# Patient Record
Sex: Male | Born: 1980 | Race: White | Hispanic: No | Marital: Married | Smoking: Current every day smoker
Health system: Southern US, Community
[De-identification: ages and names within clinical notes are randomized; demographics above are authoritative.]

## PROBLEM LIST (undated history)

## (undated) DIAGNOSIS — N319 Neuromuscular dysfunction of bladder, unspecified: Secondary | ICD-10-CM

## (undated) DIAGNOSIS — F447 Conversion disorder with mixed symptom presentation: Secondary | ICD-10-CM

## (undated) DIAGNOSIS — I4891 Unspecified atrial fibrillation: Secondary | ICD-10-CM

## (undated) DIAGNOSIS — N329 Bladder disorder, unspecified: Secondary | ICD-10-CM

## (undated) HISTORY — PX: BACK SURGERY: SHX140

---

## 2003-05-24 ENCOUNTER — Other Ambulatory Visit: Payer: Self-pay

## 2004-02-25 ENCOUNTER — Emergency Department: Payer: Self-pay | Admitting: Emergency Medicine

## 2004-04-04 ENCOUNTER — Emergency Department: Payer: Self-pay | Admitting: Emergency Medicine

## 2004-06-09 ENCOUNTER — Emergency Department: Payer: Self-pay | Admitting: Emergency Medicine

## 2004-07-18 ENCOUNTER — Emergency Department: Payer: Self-pay | Admitting: Unknown Physician Specialty

## 2004-07-18 ENCOUNTER — Other Ambulatory Visit: Payer: Self-pay

## 2004-07-28 ENCOUNTER — Emergency Department: Payer: Self-pay | Admitting: Emergency Medicine

## 2004-07-29 ENCOUNTER — Emergency Department: Payer: Self-pay | Admitting: Emergency Medicine

## 2004-09-08 ENCOUNTER — Emergency Department: Payer: Self-pay | Admitting: Emergency Medicine

## 2004-09-11 ENCOUNTER — Other Ambulatory Visit: Payer: Self-pay

## 2004-09-11 ENCOUNTER — Emergency Department: Payer: Self-pay | Admitting: Emergency Medicine

## 2004-11-08 ENCOUNTER — Emergency Department: Payer: Self-pay | Admitting: Emergency Medicine

## 2004-11-13 ENCOUNTER — Emergency Department: Payer: Self-pay | Admitting: Internal Medicine

## 2005-01-10 ENCOUNTER — Emergency Department: Payer: Self-pay | Admitting: Emergency Medicine

## 2005-01-20 ENCOUNTER — Emergency Department: Payer: Self-pay | Admitting: Emergency Medicine

## 2005-01-25 ENCOUNTER — Emergency Department: Payer: Self-pay | Admitting: Emergency Medicine

## 2007-03-18 DIAGNOSIS — I639 Cerebral infarction, unspecified: Secondary | ICD-10-CM

## 2007-03-18 HISTORY — DX: Cerebral infarction, unspecified: I63.9

## 2017-05-22 ENCOUNTER — Other Ambulatory Visit: Payer: Self-pay

## 2017-05-22 ENCOUNTER — Emergency Department (HOSPITAL_COMMUNITY)
Admission: EM | Admit: 2017-05-22 | Discharge: 2017-05-22 | Disposition: A | Discharge status: Home / Self Care | Payer: Self-pay | Attending: Emergency Medicine | Admitting: Emergency Medicine

## 2017-05-22 ENCOUNTER — Encounter (HOSPITAL_COMMUNITY): Payer: Self-pay

## 2017-05-22 DIAGNOSIS — Z5321 Procedure and treatment not carried out due to patient leaving prior to being seen by health care provider: Secondary | ICD-10-CM | POA: Insufficient documentation

## 2017-05-22 DIAGNOSIS — R339 Retention of urine, unspecified: Secondary | ICD-10-CM | POA: Insufficient documentation

## 2017-05-22 LAB — BASIC METABOLIC PANEL
ANION GAP: 12 (ref 5–15)
BUN: 9 mg/dL (ref 6–20)
CALCIUM: 8.9 mg/dL (ref 8.9–10.3)
CHLORIDE: 104 mmol/L (ref 101–111)
CO2: 22 mmol/L (ref 22–32)
CREATININE: 0.98 mg/dL (ref 0.61–1.24)
GFR calc non Af Amer: >60 mL/min (ref >60)
Glucose, Bld: 116 mg/dL — ABNORMAL HIGH (ref 65–99)
Potassium: 3.8 mmol/L (ref 3.5–5.1)
SODIUM: 138 mmol/L (ref 135–145)

## 2017-05-22 LAB — CBC
HCT: 42.6 % (ref 39.0–52.0)
HEMOGLOBIN: 14.6 g/dL (ref 13.0–17.0)
MCH: 32.5 pg (ref 26.0–34.0)
MCHC: 34.3 g/dL (ref 30.0–36.0)
MCV: 94.9 fL (ref 78.0–100.0)
PLATELETS: 254 10*3/uL (ref 150–400)
RBC: 4.49 MIL/uL (ref 4.22–5.81)
RDW: 13.3 % (ref 11.5–15.5)
WBC: 17.2 10*3/uL — AB (ref 4.0–10.5)
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
## 2017-05-22 NOTE — ED Notes (Signed)
Due to wait time pt has decided to leave

## 2019-06-10 ENCOUNTER — Other Ambulatory Visit: Payer: Self-pay

## 2019-06-10 ENCOUNTER — Ambulatory Visit: Payer: Self-pay | Attending: Internal Medicine

## 2019-06-10 DIAGNOSIS — Z20822 Contact with and (suspected) exposure to covid-19: Secondary | ICD-10-CM

## 2019-06-11 LAB — SARS-COV-2, NAA 2 DAY TAT

## 2019-06-11 LAB — NOVEL CORONAVIRUS, NAA: SARS-CoV-2, NAA: NOT DETECTED

## 2019-06-13 ENCOUNTER — Telehealth: Payer: Self-pay | Admitting: General Practice

## 2019-06-13 NOTE — Telephone Encounter (Signed)
Patient called in and received his negative covid test result  °

## 2019-10-14 ENCOUNTER — Emergency Department (HOSPITAL_COMMUNITY): Payer: Self-pay

## 2019-10-14 ENCOUNTER — Other Ambulatory Visit: Payer: Self-pay

## 2019-10-14 ENCOUNTER — Emergency Department (HOSPITAL_COMMUNITY)
Admission: EM | Admit: 2019-10-14 | Discharge: 2019-10-15 | Payer: Self-pay | Attending: Emergency Medicine | Admitting: Emergency Medicine

## 2019-10-14 ENCOUNTER — Encounter (HOSPITAL_COMMUNITY): Payer: Self-pay

## 2019-10-14 DIAGNOSIS — Y939 Activity, unspecified: Secondary | ICD-10-CM | POA: Insufficient documentation

## 2019-10-14 DIAGNOSIS — Y92193 Bedroom in other specified residential institution as the place of occurrence of the external cause: Secondary | ICD-10-CM | POA: Insufficient documentation

## 2019-10-14 DIAGNOSIS — R531 Weakness: Secondary | ICD-10-CM

## 2019-10-14 DIAGNOSIS — F17200 Nicotine dependence, unspecified, uncomplicated: Secondary | ICD-10-CM | POA: Insufficient documentation

## 2019-10-14 DIAGNOSIS — W06XXXA Fall from bed, initial encounter: Secondary | ICD-10-CM | POA: Insufficient documentation

## 2019-10-14 DIAGNOSIS — Z8673 Personal history of transient ischemic attack (TIA), and cerebral infarction without residual deficits: Secondary | ICD-10-CM | POA: Insufficient documentation

## 2019-10-14 DIAGNOSIS — Y999 Unspecified external cause status: Secondary | ICD-10-CM | POA: Insufficient documentation

## 2019-10-14 DIAGNOSIS — S20211A Contusion of right front wall of thorax, initial encounter: Secondary | ICD-10-CM

## 2019-10-14 LAB — URINALYSIS, ROUTINE W REFLEX MICROSCOPIC
Bilirubin Urine: NEGATIVE
Glucose, UA: NEGATIVE mg/dL
Hgb urine dipstick: NEGATIVE
Ketones, ur: NEGATIVE mg/dL
Leukocytes,Ua: NEGATIVE
Nitrite: NEGATIVE
Protein, ur: NEGATIVE mg/dL
Specific Gravity, Urine: 1.004 — ABNORMAL LOW (ref 1.005–1.030)
pH: 8 (ref 5.0–8.0)

## 2019-10-14 LAB — COMPREHENSIVE METABOLIC PANEL
ALT: 24 U/L (ref 0–44)
AST: 18 U/L (ref 15–41)
Albumin: 3.9 g/dL (ref 3.5–5.0)
Alkaline Phosphatase: 60 U/L (ref 38–126)
Anion gap: 8 (ref 5–15)
BUN: 9 mg/dL (ref 6–20)
CO2: 30 mmol/L (ref 22–32)
Calcium: 9.4 mg/dL (ref 8.9–10.3)
Chloride: 100 mmol/L (ref 98–111)
Creatinine, Ser: 0.8 mg/dL (ref 0.61–1.24)
GFR calc Af Amer: >60 mL/min (ref >60)
GFR calc non Af Amer: >60 mL/min (ref >60)
Glucose, Bld: 92 mg/dL (ref 70–99)
Potassium: 3.8 mmol/L (ref 3.5–5.1)
Sodium: 138 mmol/L (ref 135–145)
Total Bilirubin: 0.8 mg/dL (ref 0.3–1.2)
Total Protein: 6.3 g/dL — ABNORMAL LOW (ref 6.5–8.1)

## 2019-10-14 LAB — DIFFERENTIAL
Abs Immature Granulocytes: 0.05 10*3/uL (ref 0.00–0.07)
Basophils Absolute: 0.1 10*3/uL (ref 0.0–0.1)
Basophils Relative: 1 %
Eosinophils Absolute: 0.1 10*3/uL (ref 0.0–0.5)
Eosinophils Relative: 1 %
Immature Granulocytes: 1 %
Lymphocytes Relative: 36 %
Lymphs Abs: 3.2 10*3/uL (ref 0.7–4.0)
Monocytes Absolute: 0.6 10*3/uL (ref 0.1–1.0)
Monocytes Relative: 7 %
Neutro Abs: 4.9 10*3/uL (ref 1.7–7.7)
Neutrophils Relative %: 54 %

## 2019-10-14 LAB — I-STAT CHEM 8, ED
BUN: 12 mg/dL (ref 6–20)
Calcium, Ion: 0.99 mmol/L — ABNORMAL LOW (ref 1.15–1.40)
Chloride: 102 mmol/L (ref 98–111)
Creatinine, Ser: 0.7 mg/dL (ref 0.61–1.24)
Glucose, Bld: 86 mg/dL (ref 70–99)
HCT: 40 % (ref 39.0–52.0)
Hemoglobin: 13.6 g/dL (ref 13.0–17.0)
Potassium: 3.7 mmol/L (ref 3.5–5.1)
Sodium: 139 mmol/L (ref 135–145)
TCO2: 25 mmol/L (ref 22–32)

## 2019-10-14 LAB — PROTIME-INR
INR: 1 (ref 0.8–1.2)
Prothrombin Time: 12.5 seconds (ref 11.4–15.2)

## 2019-10-14 LAB — CBG MONITORING, ED: Glucose-Capillary: 97 mg/dL (ref 70–99)

## 2019-10-14 LAB — CBC
HCT: 42.1 % (ref 39.0–52.0)
Hemoglobin: 13.7 g/dL (ref 13.0–17.0)
MCH: 31.8 pg (ref 26.0–34.0)
MCHC: 32.5 g/dL (ref 30.0–36.0)
MCV: 97.7 fL (ref 80.0–100.0)
Platelets: 228 10*3/uL (ref 150–400)
RBC: 4.31 MIL/uL (ref 4.22–5.81)
RDW: 11.9 % (ref 11.5–15.5)
WBC: 8.9 10*3/uL (ref 4.0–10.5)
nRBC: 0 % (ref 0.0–0.2)

## 2019-10-14 LAB — RAPID URINE DRUG SCREEN, HOSP PERFORMED
Amphetamines: NOT DETECTED
Barbiturates: NOT DETECTED
Benzodiazepines: NOT DETECTED
Cocaine: NOT DETECTED
Opiates: NOT DETECTED
Tetrahydrocannabinol: NOT DETECTED

## 2019-10-14 LAB — APTT: aPTT: 27 seconds (ref 24–36)

## 2019-10-14 LAB — ETHANOL: Alcohol, Ethyl (B): <10 mg/dL (ref <10)

## 2019-10-14 MED ORDER — LORAZEPAM 2 MG/ML IJ SOLN
INTRAMUSCULAR | Status: AC
  Filled 2019-10-14: qty 1

## 2019-10-14 MED ORDER — ACETAMINOPHEN 500 MG PO TABS
1000.0000 mg | ORAL_TABLET | Freq: Once | ORAL | Status: AC
  Administered 2019-10-14: 1000 mg via ORAL
  Filled 2019-10-14: qty 2

## 2019-10-14 NOTE — ED Provider Notes (Signed)
[facility] EMERGENCY DEPARTMENT Provider Note   CSN: [id number] Arrival date & time: 10/14/19  2117  An emergency department physician performed an initial assessment on this suspected stroke patient at 2117.  History Chief Complaint  Patient presents with  . Code Stroke    Matthew Armstrong. is a 39 y.o. male.  39 year old male who presents with left-sided weakness.  Patient reportedly had sudden onset of flaccid left-sided weakness at 8 PM tonight. He reportedly fell due to weakness and hit R chest. He tells [place] that he has history of 3 previous strokes.  He previously could not get a brain MRI because he had a nerve stimulator implant but no longer has this. He endorses decreased sensation L side involving upper and lower extremities.  The history is provided by the EMS personnel.       History reviewed. No pertinent past medical history.  There are no problems to display for this patient.   History reviewed. No pertinent surgical history.     No family history on file.  Social History   Tobacco Use  . Smoking status: Current Every Day Smoker  . Smokeless tobacco: Never Used  Substance Use Topics  . Alcohol use: Yes  . Drug use: No    Home Medications Prior to Admission medications   Not on File    Allergies    Ciprofloxacin and Nsaids  Review of Systems   Review of Systems  Unable to perform ROS: Mental status change    Physical Exam Updated Vital Signs BP (!) 139/87   Pulse 57   Temp 98.4 F (36.9 C) (Oral)   Resp 15   Ht 5\' 11"  (1.803 m)   SpO2 99%   BMI 29.01 kg/m   Physical Exam Vitals and nursing note reviewed.  Constitutional:      General: He is not in acute distress.    Appearance: He is well-developed.  HENT:     Head: Normocephalic and atraumatic.  Eyes:     Extraocular Movements: Extraocular movements intact.     Conjunctiva/sclera: Conjunctivae normal.     Pupils: Pupils are equal, round, and reactive to  light.  Cardiovascular:     Rate and Rhythm: Normal rate and regular rhythm.     Heart sounds: Normal heart sounds. No murmur heard.   Pulmonary:     Effort: Pulmonary effort is normal.     Breath sounds: Normal breath sounds.  Chest:     Chest wall: Tenderness (R lower anterior ribs, no crepitus) present.  Abdominal:     General: Bowel sounds are normal. There is no distension.     Palpations: Abdomen is soft.     Tenderness: There is no abdominal tenderness.  Musculoskeletal:     Cervical back: Neck supple.  Skin:    General: Skin is warm and dry.  Neurological:     Mental Status: He is alert and oriented to person, place, and time.     Comments: Fluent speech, head turned to R; protrudes tongue to R, holds mouth pulled to R; endorses decreased sensation L forehead, LUE, and LLE; no effort when asked to move LUE/LLE; 5/5 strength RUE, RLE  Psychiatric:     Comments: Flat affect     ED Results / Procedures / Treatments   Labs (all labs ordered are listed, but only abnormal results are displayed) Labs Reviewed  URINALYSIS, ROUTINE W REFLEX MICROSCOPIC - Abnormal; Notable for the following components:  Result Value   Color, Urine STRAW (*)    Specific Gravity, Urine 1.004 (*)    All other components within normal limits  COMPREHENSIVE METABOLIC PANEL - Abnormal; Notable for the following components:   Total Protein 6.3 (*)    All other components within normal limits  I-STAT CHEM 8, ED - Abnormal; Notable for the following components:   Calcium, Ion 0.99 (*)    All other components within normal limits  ETHANOL  PROTIME-INR  APTT  CBC  DIFFERENTIAL  RAPID URINE DRUG SCREEN, HOSP PERFORMED  CBG MONITORING, ED    EKG EKG Interpretation  Date/Time:  Friday October 14 2019 22:13:17 EDT Ventricular Rate:  59 PR Interval:    QRS Duration: 98 QT Interval:  399 QTC Calculation: 396 R Axis:   72 Text Interpretation: Sinus rhythm since previous tracing, rate slower  Confirmed by Frederick PeersLittle, Ireoluwa Grant [phone]) on 10/14/2019 10:16:32 PM   Radiology DG Chest 2 View  Result Date: 10/14/2019 CLINICAL DATA:  Right lower anterior rib pain after fall. Fall across a metal chair today. EXAM: CHEST - 2 VIEW COMPARISON:  None. FINDINGS: The cardiomediastinal contours are normal. The lungs are clear. Pulmonary vasculature is normal. No consolidation, pleural effusion, or pneumothorax. No visualized rib fracture. No acute osseous abnormalities are seen. IMPRESSION: Negative radiographs of the chest.  No visualized rib fractures. Electronically Signed   By: Narda RutherfordMelanie  Sanford M.D.   On: 10/14/2019 23:07   MR ANGIO HEAD WO CONTRAST  Result Date: 10/14/2019 CLINICAL DATA:  39 year old male code stroke presentation with possible hyperdense right MCA and early cytotoxic edema in the right frontal operculum on noncontrast head CT, but discordant clinical exam. EXAM: MRI HEAD WITHOUT CONTRAST MRA HEAD WITHOUT CONTRAST TECHNIQUE: Multiplanar, multiecho pulse sequences of the brain and surrounding structures were obtained without intravenous contrast. Angiographic images of the head were obtained using MRA technique without contrast. COMPARISON:  Head CT 2129 hours today. FINDINGS: MRI HEAD FINDINGS Brain: No restricted diffusion or evidence of acute infarction. Normal cerebral volume. No midline shift, mass effect, evidence of mass lesion, ventriculomegaly, extra-axial collection or acute intracranial hemorrhage. Cervicomedullary junction and pituitary are within normal limits. Wallace CullensGray and white matter signal is within normal limits throughout the brain. No encephalomalacia or chronic cerebral blood products. Vascular: Major intracranial vascular flow voids are preserved. Skull and upper cervical spine: Negative visible cervical spine, bone marrow signal. Sinuses/Orbits: Negative orbits aside from continued leftward gaze. Left maxillary sinus mucous retention cyst. Other: Mastoids are clear. Visible  internal auditory structures appear normal. Scalp and face soft tissues appear negative. MRA HEAD FINDINGS Antegrade flow in the posterior circulation with codominant distal vertebral arteries, normal PICA origins. Patent vertebrobasilar junction and basilar artery without stenosis. Normal SCA and left PCA origins. Fetal right PCA origin. Possible small left posterior communicating artery. Bilateral PCA branches are within normal limits. Antegrade flow in both ICA siphons. No siphon stenosis. Normal ophthalmic and right posterior communicating artery origins. Patent carotid termini, MCA and ACA origins. Anterior communicating artery and visible ACA branches are within normal limits. Left MCA bifurcates early without stenosis. Visible left MCA branches are within normal limits. Right MCA also bifurcates somewhat early without stenosis. Visible right MCA branches appear normal. IMPRESSION: 1. Negative for acute infarct. Normal noncontrast MRI appearance of the brain. 2. Normal intracranial MRA. The above was discussed by telephone with Dr. Milon DikesASHISH ARORA on 10/14/2019 at 1000 hours. Electronically Signed   By: Odessa FlemingH  Hall M.D.   On: 10/14/2019 22:33  MR BRAIN WO CONTRAST  Result Date: 10/14/2019 CLINICAL DATA:  39 year old male code stroke presentation with possible hyperdense right MCA and early cytotoxic edema in the right frontal operculum on noncontrast head CT, but discordant clinical exam. EXAM: MRI HEAD WITHOUT CONTRAST MRA HEAD WITHOUT CONTRAST TECHNIQUE: Multiplanar, multiecho pulse sequences of the brain and surrounding structures were obtained without intravenous contrast. Angiographic images of the head were obtained using MRA technique without contrast. COMPARISON:  Head CT 2129 hours today. FINDINGS: MRI HEAD FINDINGS Brain: No restricted diffusion or evidence of acute infarction. Normal cerebral volume. No midline shift, mass effect, evidence of mass lesion, ventriculomegaly, extra-axial collection or  acute intracranial hemorrhage. Cervicomedullary junction and pituitary are within normal limits. Wallace Cullens and white matter signal is within normal limits throughout the brain. No encephalomalacia or chronic cerebral blood products. Vascular: Major intracranial vascular flow voids are preserved. Skull and upper cervical spine: Negative visible cervical spine, bone marrow signal. Sinuses/Orbits: Negative orbits aside from continued leftward gaze. Left maxillary sinus mucous retention cyst. Other: Mastoids are clear. Visible internal auditory structures appear normal. Scalp and face soft tissues appear negative. MRA HEAD FINDINGS Antegrade flow in the posterior circulation with codominant distal vertebral arteries, normal PICA origins. Patent vertebrobasilar junction and basilar artery without stenosis. Normal SCA and left PCA origins. Fetal right PCA origin. Possible small left posterior communicating artery. Bilateral PCA branches are within normal limits. Antegrade flow in both ICA siphons. No siphon stenosis. Normal ophthalmic and right posterior communicating artery origins. Patent carotid termini, MCA and ACA origins. Anterior communicating artery and visible ACA branches are within normal limits. Left MCA bifurcates early without stenosis. Visible left MCA branches are within normal limits. Right MCA also bifurcates somewhat early without stenosis. Visible right MCA branches appear normal. IMPRESSION: 1. Negative for acute infarct. Normal noncontrast MRI appearance of the brain. 2. Normal intracranial MRA. The above was discussed by telephone with Dr. Milon Dikes on 10/14/2019 at 1000 hours. Electronically Signed   By: Odessa Fleming M.D.   On: 10/14/2019 22:33   CT HEAD CODE STROKE WO CONTRAST  Result Date: 10/14/2019 CLINICAL DATA:  Code stroke. 39 year old male with left side weakness and facial droop. EXAM: CT HEAD WITHOUT CONTRAST TECHNIQUE: Contiguous axial images were obtained from the base of the skull through  the vertex without intravenous contrast. COMPARISON:  Head CT 01/25/2005.  Sinuses FINDINGS: Brain: Normal cerebral volume. No midline shift, ventriculomegaly, mass effect, evidence of mass lesion, intracranial hemorrhage. Subtle asymmetric hypodensity in the right operculum best seen on coronal image 33. Elsewhere normal gray-white matter differentiation, no other evidence of cytotoxic edema. Vascular: Questionable hyperdense right MCA on sagittal image 19, not well correlated on axial images. Skull: Negative. Sinuses/Orbits: left maxillary mucous retention cyst, otherwise clear. Other: Leftward gaze. Otherwise negative orbit and scalp soft tissues. ASPECTS [facility] Stroke Program Early CT Score) - Ganglionic level infarction (caudate, lentiform nuclei, internal capsule, insula, M1-M3 cortex): 7 - Supraganglionic infarction (M4-M6 cortex): 2 (abnormal right M5) Total score (0-10 with 10 being normal): 9. IMPRESSION: 1. Possible hyperdense right MCA and early cytotoxic edema in the right frontal operculum. ASPECTS 9. Negative for intracranial hemorrhage. 2. These results were communicated to Dr. Wilford Corner at 9:32 pm on 10/14/2019 by text page via the [facility] messaging system. Electronically Signed   By: Odessa Fleming M.D.   On: 10/14/2019 21:33    Procedures Procedures (including critical care time)  Medications Ordered in ED Medications  LORazepam (ATIVAN) 2 MG/ML injection (  Not  Given 10/14/19 2215)  acetaminophen (TYLENOL) tablet 1,000 mg (has no administration in time range)    ED Course  I have reviewed the triage vital signs and the nursing notes.  Pertinent labs & imaging results that were available during my care of the patient were reviewed by me and considered in my medical decision making (see chart for details).    MDM Rules/Calculators/A&P                          Pt arrived as code stroke, taken immediately to CT scan and evaluated by Dr. Wilford Corner, stroke team.  Exam had some consistencies that  were less suggestive of acute stroke.  Head CT showed possible area of hyperdensity in right MCA but negative for hemorrhage.  Obtained MRI brain which was normal. Per Dr. Wilford Corner, symptoms seemed to be non-neurogenic. CXR negative for injury and screening labs reassuring.  On reassessment, patient is sitting up in bed, moving arms and legs freely, no acute distress.  Complaining of headache for which I gave him Tylenol. Discussed w/u results.  Patient discharged back to detention facility. Final Clinical Impression(s) / ED Diagnoses Final diagnoses:  Left-sided weakness  Contusion of right chest wall, initial encounter    Rx / DC Orders ED Discharge Orders    None       Trevan Messman, Ambrose Finland, MD 10/14/19 [id number]

## 2019-10-14 NOTE — ED Notes (Signed)
Patient verbalizes understanding of discharge instructions. Opportunity for questioning and answers were provided. Armband removed by staff, Pt is now awaiting security to be escorted off property.

## 2019-10-14 NOTE — ED Triage Notes (Signed)
Pt BIB [facility] from [facility] as a Code Stroke.   LSN was 2000 during CO rounds and shortly later pt fell due to weakness and called for help. Pt complaining of pain on R. Rib Cage from Fall.  Upon arrival, pt presents with L. Sided Weakness, Dysarthria, and L. Arm/Leg Drift.   VSS with EMS  CBG 114 and BP 117/80 with EMS.  A&Ox4, FQH22

## 2019-10-14 NOTE — Code Documentation (Signed)
  Rapid Response Nurse Documentation Code Documentation  Matthew Armstrong. is a 39 y.o. male arriving to [facility] ED via [facility] on 7/30 @ 2117 with past medical hx of headaches, CVA x 3(per pt), neurogenic bladder s/p bladder stimulator implant(now removed). Code stroke was activated by EMS. Patient from a detention facility where he was LKW at 2000 and now complaining of L side weakness, L facial droop, and slurred speech. Stroke team at the bedside on patient arrival. Labs drawn and patient cleared for CT by Dr. Clarene Duke. Patient to CT with team. NIHSS 12, see documentation for details and code stroke times. Patient with L side flaccidity, L droop, and slurred speech on exam. The following imagings were completed:  CT-possible hyperdense R MCA and early cytotoxic edema in R frontal operculum, no hemorrhage. Pt was then transported to MRI @ 2129 which was negative.  Patient is not a candidate for tPA due to not a stroke. Plan to treat for migraines.   Matthew Armstrong, Matthew Armstrong  [employer]

## 2019-10-14 NOTE — Consult Note (Signed)
Neurology Consultation  Reason for Consult: code stroke for left sided weakness Referring Physician: Dr Everett Graff, EDP  CC: Left sided weakness  History is obtained frompatient and chart  HPI: Matthew Armstrong. is a 39 y.o. male currently in a detention facility, with PMH of headaches and also according to patient, 3 strokes in the past with no residual deficits, neurogenic bladder s/p bladder stimulator implant followed by removal of implant and lead in 2019, presented from facility via [facility] due to sudden onset of left sided weakness.  He was in his usual state of health when he suddenly noticed left sided weakness which made him fall from his bunk. He was not able to move the left arm or leg. Also had slurred speech. EMS called and evaluated the patient - found left sided weakness, facial droop and dysarthria and activated code stroke and transported to [facility] ED. In the ER, we received him on the bridge, examined and took for stat CTH. Exam consistent with some non organic findings making stroke diagnosis dicey. Taken for stat MRI brain due to CT reading from radiology with possible right MCA hyperdensity. He had no lines to do a stat CTA and in the interest of time, MRI was chosen as a table was open immediately. See exam and imaging studies below.   LKW: 2000 hrs 10/14/2019 tpa given?: no, psychogenic exam, MRI negative Premorbid modified Rankin scale (mRS): 0   ROS:  Performed and negative except as noted in HPI  PMH reviewed and documented in HPI by  Me  No family history on file.  Social History:   reports that he has been smoking. He has never used smokeless tobacco. He reports current alcohol use. He reports that he does not use drugs.  Medications  Current Facility-Administered Medications:  .  LORazepam (ATIVAN) 2 MG/ML injection, , , ,  No current outpatient medications on file.   Exam: Current vital signs: BP 109/75 (BP Location: Right Arm)   Pulse 59    Temp 98.3 F (36.8 C) (Oral)   Resp 16   Ht 5\' 11"  (1.803 m)   SpO2 100%   BMI 29.01 kg/m  Vital signs in last 24 hours: Temp:  [98.3 F (36.8 C)-98.4 F (36.9 C)] 98.3 F (36.8 C) (07/30 2343) Pulse Rate:  [57-61] 59 (07/30 2343) Resp:  [15-19] 16 (07/30 2343) BP: (109-139)/(75-87) 109/75 (07/30 2343) SpO2:  [99 %-100 %] 100 % (07/30 2343) Gen: AAO x3 in NAD HEENT: [place] AT MMM CVS: RRR Resp: CTABL Ext: Warm well perfused NEUROLOGICAL MS: AAOx3 Speech and language: No aphasia, Mild dysarthria CN: PERRL. No gaze pref or deviation, VFF, facial sensation diminished with sharp cutoff in midline on the forehead, facial strength exam reveals volitional suppression of opening of left angle of the mouth. Tongue exam; upon asking to protrude tongue, tongue deviates to right Motor: flaccid LUE and LLE. 5/5 RUE and RLE Sensory: not sensate to touch or nox stim on left. Sharp cutoff in midline with intact sensation on right. Coord: no dysmetria on right, can not perform left side Gait deferred  NIHSS 1a Level of Conscious.: 0 1b LOC Questions: 0 1c LOC Commands: 0 2 Best Gaze: 0 3 Visual: 0 4 Facial Palsy: 1 5a Motor Arm - left: 4 5b Motor Arm - Right: 0 6a Motor Leg - Left: 4 6b Motor Leg - Right: 0 7 Limb Ataxia: 0 8 Sensory: 2 9 Best Language: 0 10 Dysarthria: 1 11 Extinct. and  Inatten.: 0 TOTAL: 12  Labs I have reviewed labs in epic and the results pertinent to this consultation are: CBC    Component Value Date/Time   WBC 8.9 10/14/2019 2118   RBC 4.31 10/14/2019 2118   HGB 13.6 10/14/2019 2127   HCT 40.0 10/14/2019 2127   PLT 228 10/14/2019 2118   MCV 97.7 10/14/2019 2118   MCH 31.8 10/14/2019 2118   MCHC 32.5 10/14/2019 2118   RDW 11.9 10/14/2019 2118   LYMPHSABS 3.2 10/14/2019 2118   MONOABS 0.6 10/14/2019 2118   EOSABS 0.1 10/14/2019 2118   BASOSABS 0.1 10/14/2019 2118    CMP     Component Value Date/Time   NA 138 10/14/2019 2215   K 3.8 10/14/2019  2215   CL 100 10/14/2019 2215   CO2 30 10/14/2019 2215   GLUCOSE 92 10/14/2019 2215   BUN 9 10/14/2019 2215   CREATININE 0.80 10/14/2019 2215   CALCIUM 9.4 10/14/2019 2215   PROT 6.3 (L) 10/14/2019 2215   ALBUMIN 3.9 10/14/2019 2215   AST 18 10/14/2019 2215   ALT 24 10/14/2019 2215   ALKPHOS 60 10/14/2019 2215   BILITOT 0.8 10/14/2019 2215   GFRNONAA >60 10/14/2019 2215   GFRAA >60 10/14/2019 2215   Imaging I have reviewed the images obtained:  CT-scan of the brain-question hyperdense right MCA although could be artifactual. My ASPECTS is 10. Radiology ASPECTS 9 for possible loss of GW differentiation in right insula.  MRI examination of the brain-obtained stat-negative for stroke. MRA head to evaluate the questionable hyperdense MCA reveals no occlusion or stenosis in the right MCA.  CT finding likely artifactual.  Assessment:  39 year old man brought in from a detention facility for acute onset of left-sided flaccid weakness. Exam with multiple inconsistencies including volitional facial weakness, sharp cutoff of sensation in the midline and effort related weakness in the left side. Also tongue deviation to the right-inconsistent with a left hemiparetic stroke. Suspicion for stroke is low but he provided verbal history of having had multiple strokes in the past, because of which a stat MRI was performed-which was negative for stroke. Also reports history of headaches. At this time top of the differential psychogenic weakness versus complex migraine.  Recommendations: Migraine cocktail If symptoms improve, no further neurological work-up. Outpatient neurology follow-up for headaches if desired. Plan discussed with Dr. Clarene Duke in the emergency room. Please call neurology with questions.    -- Milon Dikes, MD [facility] Pager: [phone] If 7pm to 7am, please call on call as listed on AMION.

## 2020-08-04 ENCOUNTER — Emergency Department
Admission: EM | Admit: 2020-08-04 | Discharge: 2020-08-04 | Disposition: A | Discharge status: Other Acute Inpatient Hospital | Payer: Self-pay | Attending: Emergency Medicine | Admitting: Emergency Medicine

## 2020-08-04 ENCOUNTER — Other Ambulatory Visit: Payer: Self-pay

## 2020-08-04 ENCOUNTER — Emergency Department: Payer: Self-pay

## 2020-08-04 ENCOUNTER — Encounter: Payer: Self-pay | Admitting: Intensive Care

## 2020-08-04 DIAGNOSIS — W3400XA Accidental discharge from unspecified firearms or gun, initial encounter: Secondary | ICD-10-CM | POA: Insufficient documentation

## 2020-08-04 DIAGNOSIS — S62601B Fracture of unspecified phalanx of left index finger, initial encounter for open fracture: Secondary | ICD-10-CM | POA: Insufficient documentation

## 2020-08-04 DIAGNOSIS — Z20822 Contact with and (suspected) exposure to covid-19: Secondary | ICD-10-CM | POA: Insufficient documentation

## 2020-08-04 DIAGNOSIS — F17210 Nicotine dependence, cigarettes, uncomplicated: Secondary | ICD-10-CM | POA: Insufficient documentation

## 2020-08-04 LAB — SAMPLE TO BLOOD BANK

## 2020-08-04 LAB — RESP PANEL BY RT-PCR (FLU A&B, COVID) ARPGX2
Influenza A by PCR: NEGATIVE
Influenza B by PCR: NEGATIVE
SARS Coronavirus 2 by RT PCR: NEGATIVE

## 2020-08-04 LAB — COMPREHENSIVE METABOLIC PANEL
ALT: 18 U/L (ref 0–44)
AST: 25 U/L (ref 15–41)
Albumin: 4.6 g/dL (ref 3.5–5.0)
Alkaline Phosphatase: 92 U/L (ref 38–126)
Anion gap: 16 — ABNORMAL HIGH (ref 5–15)
BUN: 17 mg/dL (ref 6–20)
CO2: 18 mmol/L — ABNORMAL LOW (ref 22–32)
Calcium: 9.5 mg/dL (ref 8.9–10.3)
Chloride: 103 mmol/L (ref 98–111)
Creatinine, Ser: 1.04 mg/dL (ref 0.61–1.24)
GFR, Estimated: >60 mL/min (ref >60)
Glucose, Bld: 143 mg/dL — ABNORMAL HIGH (ref 70–99)
Potassium: 3.3 mmol/L — ABNORMAL LOW (ref 3.5–5.1)
Sodium: 137 mmol/L (ref 135–145)
Total Bilirubin: 1.1 mg/dL (ref 0.3–1.2)
Total Protein: 8.1 g/dL (ref 6.5–8.1)

## 2020-08-04 LAB — CBC WITH DIFFERENTIAL/PLATELET
Abs Immature Granulocytes: 0.04 10*3/uL (ref 0.00–0.07)
Basophils Absolute: 0 10*3/uL (ref 0.0–0.1)
Basophils Relative: 0 %
Eosinophils Absolute: 0.1 10*3/uL (ref 0.0–0.5)
Eosinophils Relative: 1 %
HCT: 43.3 % (ref 39.0–52.0)
Hemoglobin: 15.1 g/dL (ref 13.0–17.0)
Immature Granulocytes: 0 %
Lymphocytes Relative: 25 %
Lymphs Abs: 3.1 10*3/uL (ref 0.7–4.0)
MCH: 31.5 pg (ref 26.0–34.0)
MCHC: 34.9 g/dL (ref 30.0–36.0)
MCV: 90.2 fL (ref 80.0–100.0)
Monocytes Absolute: 0.6 10*3/uL (ref 0.1–1.0)
Monocytes Relative: 5 %
Neutro Abs: 8.7 10*3/uL — ABNORMAL HIGH (ref 1.7–7.7)
Neutrophils Relative %: 69 %
Platelets: 290 10*3/uL (ref 150–400)
RBC: 4.8 MIL/uL (ref 4.22–5.81)
RDW: 13.1 % (ref 11.5–15.5)
WBC: 12.5 10*3/uL — ABNORMAL HIGH (ref 4.0–10.5)
nRBC: 0 % (ref 0.0–0.2)

## 2020-08-04 MED ORDER — HYDROMORPHONE HCL 1 MG/ML IJ SOLN
1.0000 mg | Freq: Once | INTRAMUSCULAR | Status: AC
  Administered 2020-08-04: 1 mg via INTRAVENOUS
  Filled 2020-08-04: qty 1

## 2020-08-04 MED ORDER — SODIUM CHLORIDE 0.9 % IV SOLN
1.5000 g | Freq: Once | INTRAVENOUS | Status: AC
  Administered 2020-08-04: 1.5 g via INTRAVENOUS
  Filled 2020-08-04: qty 4

## 2020-08-04 MED ORDER — ONDANSETRON HCL 4 MG/2ML IJ SOLN
4.0000 mg | Freq: Once | INTRAMUSCULAR | Status: AC
  Administered 2020-08-04: 4 mg via INTRAVENOUS
  Filled 2020-08-04: qty 2

## 2020-08-04 MED ORDER — DEXTROSE 5 % IV SOLN: 0.5000 g | Freq: Once | INTRAVENOUS | Status: DC
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
## 2020-08-04 NOTE — ED Notes (Addendum)
Xeroform applied to wound per Fuller Plan, MD. Volar splint applied by Verdon Cummins, NT. Cap refill less than 3 seconds in fingers.
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
## 2020-08-04 NOTE — ED Notes (Signed)
Grey colored towel placed in brown bag with other previously chart patient belongings.

## 2020-08-04 NOTE — ED Notes (Signed)
X-ray at bedside

## 2020-08-04 NOTE — ED Notes (Signed)
Patient given patient phone to make calls.
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
## 2020-08-04 NOTE — ED Notes (Signed)
Transfer form signed by patient and this RN.
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
## 2020-08-04 NOTE — ED Notes (Signed)
[place] [employer] at bedside with patient.

## 2022-02-04 DIAGNOSIS — R079 Chest pain, unspecified: Secondary | ICD-10-CM | POA: Insufficient documentation

## 2022-02-04 DIAGNOSIS — R0602 Shortness of breath: Secondary | ICD-10-CM | POA: Insufficient documentation

## 2022-02-04 DIAGNOSIS — R630 Anorexia: Secondary | ICD-10-CM | POA: Insufficient documentation

## 2022-02-04 DIAGNOSIS — Z20822 Contact with and (suspected) exposure to covid-19: Secondary | ICD-10-CM | POA: Insufficient documentation

## 2022-02-04 DIAGNOSIS — Z5321 Procedure and treatment not carried out due to patient leaving prior to being seen by health care provider: Secondary | ICD-10-CM | POA: Insufficient documentation

## 2022-02-04 DIAGNOSIS — R509 Fever, unspecified: Secondary | ICD-10-CM | POA: Insufficient documentation

## 2022-02-04 DIAGNOSIS — R059 Cough, unspecified: Secondary | ICD-10-CM | POA: Insufficient documentation

## 2022-02-05 ENCOUNTER — Emergency Department (HOSPITAL_COMMUNITY): Payer: Self-pay

## 2022-02-05 ENCOUNTER — Emergency Department (HOSPITAL_COMMUNITY)
Admission: EM | Admit: 2022-02-05 | Discharge: 2022-02-05 | Discharge status: Against Medical Advice | Payer: Self-pay | Attending: Emergency Medicine | Admitting: Emergency Medicine

## 2022-02-05 ENCOUNTER — Encounter (HOSPITAL_COMMUNITY): Payer: Self-pay

## 2022-02-05 ENCOUNTER — Other Ambulatory Visit: Payer: Self-pay

## 2022-02-05 HISTORY — DX: Bladder disorder, unspecified: N32.9

## 2022-02-05 LAB — CBC
HCT: 41.9 % (ref 39.0–52.0)
Hemoglobin: 14 g/dL (ref 13.0–17.0)
MCH: 31.5 pg (ref 26.0–34.0)
MCHC: 33.4 g/dL (ref 30.0–36.0)
MCV: 94.4 fL (ref 80.0–100.0)
Platelets: 328 10*3/uL (ref 150–400)
RBC: 4.44 MIL/uL (ref 4.22–5.81)
RDW: 13.9 % (ref 11.5–15.5)
WBC: 15 10*3/uL — ABNORMAL HIGH (ref 4.0–10.5)
nRBC: 0 % (ref 0.0–0.2)

## 2022-02-05 LAB — BASIC METABOLIC PANEL
Anion gap: 9 (ref 5–15)
BUN: 9 mg/dL (ref 6–20)
CO2: 25 mmol/L (ref 22–32)
Calcium: 8.9 mg/dL (ref 8.9–10.3)
Chloride: 104 mmol/L (ref 98–111)
Creatinine, Ser: 0.83 mg/dL (ref 0.61–1.24)
GFR, Estimated: >60 mL/min (ref >60)
Glucose, Bld: 123 mg/dL — ABNORMAL HIGH (ref 70–99)
Potassium: 3.6 mmol/L (ref 3.5–5.1)
Sodium: 138 mmol/L (ref 135–145)

## 2022-02-05 LAB — GROUP A STREP BY PCR: Group A Strep by PCR: NOT DETECTED

## 2022-02-05 LAB — TROPONIN I (HIGH SENSITIVITY): Troponin I (High Sensitivity): 4 ng/L (ref <18)

## 2022-02-05 LAB — SARS CORONAVIRUS 2 BY RT PCR: SARS Coronavirus 2 by RT PCR: NEGATIVE

## 2022-02-05 NOTE — ED Notes (Signed)
Pt name called 3x for vitals, no response

## 2022-02-05 NOTE — ED Notes (Signed)
;  pt called x3 for repeat troponin no answer @221am

## 2022-02-05 NOTE — ED Triage Notes (Addendum)
Pt to ED pov. Pt has had cough, fever, chills, decreased appetite and sore throat x 3-4 days. Pt states he has been around a child that was sick as well. Pt states he is having chest pain and shortness of breath as well.

## 2022-03-20 ENCOUNTER — Encounter (HOSPITAL_COMMUNITY): Payer: Self-pay

## 2022-03-20 ENCOUNTER — Other Ambulatory Visit: Payer: Self-pay

## 2022-03-20 ENCOUNTER — Emergency Department (HOSPITAL_COMMUNITY)
Admission: EM | Admit: 2022-03-20 | Discharge: 2022-03-20 | Discharge status: Against Medical Advice | Payer: Self-pay | Attending: Emergency Medicine | Admitting: Emergency Medicine

## 2022-03-20 DIAGNOSIS — R072 Precordial pain: Secondary | ICD-10-CM | POA: Insufficient documentation

## 2022-03-20 DIAGNOSIS — R0989 Other specified symptoms and signs involving the circulatory and respiratory systems: Secondary | ICD-10-CM | POA: Insufficient documentation

## 2022-03-20 DIAGNOSIS — Z1152 Encounter for screening for COVID-19: Secondary | ICD-10-CM | POA: Insufficient documentation

## 2022-03-20 DIAGNOSIS — Z5321 Procedure and treatment not carried out due to patient leaving prior to being seen by health care provider: Secondary | ICD-10-CM | POA: Insufficient documentation

## 2022-03-20 DIAGNOSIS — R059 Cough, unspecified: Secondary | ICD-10-CM | POA: Insufficient documentation

## 2022-03-20 DIAGNOSIS — R6883 Chills (without fever): Secondary | ICD-10-CM | POA: Insufficient documentation

## 2022-03-20 DIAGNOSIS — R438 Other disturbances of smell and taste: Secondary | ICD-10-CM | POA: Insufficient documentation

## 2022-03-20 DIAGNOSIS — B974 Respiratory syncytial virus as the cause of diseases classified elsewhere: Secondary | ICD-10-CM | POA: Insufficient documentation

## 2022-03-20 DIAGNOSIS — R1110 Vomiting, unspecified: Secondary | ICD-10-CM | POA: Insufficient documentation

## 2022-03-20 LAB — RESP PANEL BY RT-PCR (RSV, FLU A&B, COVID)  RVPGX2
Influenza A by PCR: NEGATIVE
Influenza B by PCR: NEGATIVE
Resp Syncytial Virus by PCR: POSITIVE — AB
SARS Coronavirus 2 by RT PCR: NEGATIVE

## 2022-03-20 LAB — BASIC METABOLIC PANEL
Anion gap: 10 (ref 5–15)
BUN: 11 mg/dL (ref 6–20)
CO2: 26 mmol/L (ref 22–32)
Calcium: 9.4 mg/dL (ref 8.9–10.3)
Chloride: 102 mmol/L (ref 98–111)
Creatinine, Ser: 0.95 mg/dL (ref 0.61–1.24)
GFR, Estimated: >60 mL/min (ref >60)
Glucose, Bld: 106 mg/dL — ABNORMAL HIGH (ref 70–99)
Potassium: 3.8 mmol/L (ref 3.5–5.1)
Sodium: 138 mmol/L (ref 135–145)

## 2022-03-20 LAB — CBC
HCT: 46.3 % (ref 39.0–52.0)
Hemoglobin: 15.7 g/dL (ref 13.0–17.0)
MCH: 31.1 pg (ref 26.0–34.0)
MCHC: 33.9 g/dL (ref 30.0–36.0)
MCV: 91.7 fL (ref 80.0–100.0)
Platelets: 295 10*3/uL (ref 150–400)
RBC: 5.05 MIL/uL (ref 4.22–5.81)
RDW: 13.4 % (ref 11.5–15.5)
WBC: 12.3 10*3/uL — ABNORMAL HIGH (ref 4.0–10.5)
nRBC: 0 % (ref 0.0–0.2)

## 2022-03-20 LAB — TROPONIN I (HIGH SENSITIVITY): Troponin I (High Sensitivity): 2 ng/L (ref <18)

## 2022-03-20 NOTE — ED Notes (Signed)
Called and no answer per sort

## 2022-03-20 NOTE — ED Triage Notes (Addendum)
Pt c/o moist cough, vomiting, nasal drainage, chills, loss of taste and smell, SOBx1wk. Pt c/o non radiating sharp midsternal chest pain. Pt states it hurts when he breathes.

## 2022-03-20 NOTE — ED Notes (Signed)
Pt not answering

## 2022-03-20 NOTE — ED Provider Triage Note (Signed)
Emergency Medicine Provider Triage Evaluation Note  Matthew Armstrong. , a 42 y.o. male  was evaluated in triage.  Pt complains of productive cough onset 1 week.  Denies sick contacts.  Has associated vomiting, rhinorrhea, chills, Loss of Taste/Smell nonradiating sharp midsternal chest pain that is exacerbated with inspiration.  No meds tried at home..   Review of Systems  Positive:  Negative:   Physical Exam  BP (!) 130/93 (BP Location: Left Arm)   Pulse 93   Temp 98.7 F (37.1 C) (Oral)   Resp 18   Ht 5\' 10"  (1.778 m)   Wt 95.3 kg   SpO2 99%   BMI 30.15 kg/m  Gen:   Awake, no distress   Resp:  Normal effort  MSK:   Moves extremities without difficulty  Other:  TTP to sternal chest. Able to speak in clear complete sentences.   Medical Decision Making  Medically screening exam initiated at 4:28 PM.  Appropriate orders placed.  Katy Apo. was informed that the remainder of the evaluation will be completed by another provider, this initial triage assessment does not replace that evaluation, and the importance of remaining in the ED until their evaluation is complete.     Ramya Vanbergen A, PA-C 03/20/22 [phone]

## 2022-04-29 ENCOUNTER — Emergency Department
Admission: EM | Admit: 2022-04-29 | Discharge: 2022-04-30 | Disposition: A | Discharge status: Home / Self Care | Payer: Self-pay | Attending: Emergency Medicine | Admitting: Emergency Medicine

## 2022-04-29 ENCOUNTER — Emergency Department: Payer: Self-pay

## 2022-04-29 ENCOUNTER — Other Ambulatory Visit: Payer: Self-pay

## 2022-04-29 DIAGNOSIS — U071 COVID-19: Secondary | ICD-10-CM | POA: Insufficient documentation

## 2022-04-29 NOTE — ED Provider Notes (Signed)
   [facility] Provider Note    Event Date/Time   First MD Initiated Contact with Patient 04/29/22 2323     (approximate)   History   Congestion  HPI  Matthew Armstrong. is a 42 y.o. male otherwise healthy on Celexa who comes in with concerns for congestion.  Patient presents with his 79-year-old son.  He reports both of them have been sick for the past 4 days with cough, congestion, sore throat, mild headache, overall just not feeling well.  He does report history of neurogenic bladder but he still able to urinate on his own denies any urinary symptoms.  Patient took Tylenol prior to arrival.     Physical Exam   Triage Vital Signs: ED Triage Vitals  Enc Vitals Group     BP      Pulse      Resp      Temp      Temp src      SpO2      Weight      Height      Head Circumference      Peak Flow      Pain Score      Pain Loc      Pain Edu?      Excl. in [employer]?     Most recent vital signs: There were no vitals filed for this visit.   General: Awake, no distress.  CV:  Good peripheral perfusion.  Resp:  Normal effort.  Abd:  No distention.  Other:     ED Results / Procedures / Treatments   Labs (all labs ordered are listed, but only abnormal results are displayed) Labs Reviewed  RESP PANEL BY RT-PCR (RSV, FLU A&B, COVID)  RVPGX2  GROUP A STREP BY PCR     EKG  My interpretation of EKG:    RADIOLOGY I have reviewed the xray personally and agree with radiology read   PROCEDURES:  Critical Care performed: {CriticalCareYesNo:19197::"Yes, see critical care procedure note(s)","No"}  Procedures   MEDICATIONS ORDERED IN ED: Medications - No data to display   IMPRESSION / MDM / [place] / ED COURSE  I reviewed the triage vital signs and the nursing notes.   Patient's presentation is most consistent with {EM COPA:27473}     The patient is on the cardiac monitor to evaluate for evidence of arrhythmia and/or significant  heart rate changes.      FINAL CLINICAL IMPRESSION(S) / ED DIAGNOSES   Final diagnoses:  None     Rx / DC Orders   ED Discharge Orders     None        Note:  This document was prepared using Dragon voice recognition software and may include unintentional dictation errors.

## 2022-04-29 NOTE — ED Triage Notes (Signed)
Patient states he has been feeling run down the pass 5 days. States he has had a fever, runny nose and has thrown up. States he just feels unwell.

## 2022-04-30 LAB — RESP PANEL BY RT-PCR (RSV, FLU A&B, COVID)  RVPGX2
Influenza A by PCR: NEGATIVE
Influenza B by PCR: NEGATIVE
Resp Syncytial Virus by PCR: NEGATIVE
SARS Coronavirus 2 by RT PCR: POSITIVE — AB

## 2022-04-30 LAB — GROUP A STREP BY PCR: Group A Strep by PCR: NOT DETECTED

## 2022-04-30 MED ORDER — BENZONATATE 100 MG PO CAPS: 100.0000 mg | ORAL_CAPSULE | Freq: Three times a day (TID) | ORAL | 0 refills | Status: DC | PRN

## 2022-04-30 NOTE — Discharge Instructions (Addendum)
Return to the ER if you develop worsening shortness of breath or any other concerns

## 2022-05-23 ENCOUNTER — Emergency Department: Payer: Self-pay

## 2022-05-23 ENCOUNTER — Other Ambulatory Visit: Payer: Self-pay

## 2022-05-23 ENCOUNTER — Emergency Department
Admission: EM | Admit: 2022-05-23 | Discharge: 2022-05-23 | Disposition: A | Discharge status: Home / Self Care | Payer: Self-pay | Attending: Student in an Organized Health Care Education/Training Program | Admitting: Student in an Organized Health Care Education/Training Program

## 2022-05-23 DIAGNOSIS — Z1152 Encounter for screening for COVID-19: Secondary | ICD-10-CM | POA: Insufficient documentation

## 2022-05-23 DIAGNOSIS — R059 Cough, unspecified: Secondary | ICD-10-CM | POA: Insufficient documentation

## 2022-05-23 DIAGNOSIS — R051 Acute cough: Secondary | ICD-10-CM

## 2022-05-23 LAB — RESP PANEL BY RT-PCR (RSV, FLU A&B, COVID)  RVPGX2
Influenza A by PCR: NEGATIVE
Influenza B by PCR: NEGATIVE
Resp Syncytial Virus by PCR: NEGATIVE
SARS Coronavirus 2 by RT PCR: NEGATIVE

## 2022-05-23 NOTE — Discharge Instructions (Signed)
You did not wish for any further work up to be done today.  Your COVID/flu/RSV swab was negative.  Please follow-up with your outpatient provider.  Please return for any new, worsening, or change in symptoms or other concerns. It was a pleasure caring for you today.

## 2022-05-23 NOTE — ED Provider Notes (Signed)
[facility] Provider Note    Event Date/Time   First MD Initiated Contact with Patient 05/23/22 1302     (approximate)   History   Emesis   HPI  Matthew Shave. is a 42 y.o. male who presents today for evaluation of cough, body aches, runny nose for the past 2 days.  He is unsure of any sick contacts.  He denies chest pain or shortness of breath.  He has not had any abdominal pain.  He has not taken any medicines for his symptoms today.  He denies any recent travel.  There are no problems to display for this patient.         Physical Exam   Triage Vital Signs: ED Triage Vitals  Enc Vitals Group     BP 05/23/22 1253 123/77     Pulse Rate 05/23/22 1253 90     Resp 05/23/22 1253 18     Temp 05/23/22 1253 98.3 F (36.8 C)     Temp Source 05/23/22 1253 Oral     SpO2 05/23/22 1253 99 %     Weight --      Height --      Head Circumference --      Peak Flow --      Pain Score 05/23/22 1242 7     Pain Loc --      Pain Edu? --      Excl. in [place]? --     Most recent vital signs: Vitals:   05/23/22 1253  BP: 123/77  Pulse: 90  Resp: 18  Temp: 98.3 F (36.8 C)  SpO2: 99%    Physical Exam Vitals and nursing note reviewed.  Constitutional:      General: Awake and alert. No acute distress.    Appearance: Normal appearance. The patient is normal weight.  HENT:     Head: Normocephalic and atraumatic.     Mouth: Mucous membranes are moist.  Nasal congestion noted Eyes:     General: PERRL. Normal EOMs        Right eye: No discharge.        Left eye: No discharge.     Conjunctiva/sclera: Conjunctivae normal.  Cardiovascular:     Rate and Rhythm: Normal rate and regular rhythm.     Pulses: Normal pulses.     Heart sounds: Normal heart sounds Pulmonary:     Effort: Pulmonary effort is normal. No respiratory distress.     Breath sounds: Normal breath sounds.  Able to speak easily in complete sentences Abdominal:     Abdomen is soft.  There is no abdominal tenderness to deep palpation in all 4 quadrants. No rebound or guarding. No distention. Musculoskeletal:        General: No swelling. Normal range of motion.     Cervical back: Normal range of motion and neck supple.  No lymphadenopathy Skin:    General: Skin is warm and dry.     Capillary Refill: Capillary refill takes less than 2 seconds.     Findings: No rash.  Neurological:     Mental Status: The patient is awake and alert.      ED Results / Procedures / Treatments   Labs (all labs ordered are listed, but only abnormal results are displayed) Labs Reviewed  RESP PANEL BY RT-PCR (RSV, FLU A&B, COVID)  RVPGX2     EKG     RADIOLOGY I independently reviewed and interpreted imaging and agree with radiologists  findings.     PROCEDURES:  Critical Care performed:   Procedures   MEDICATIONS ORDERED IN ED: Medications - No data to display   IMPRESSION / MDM / [place] / ED COURSE  I reviewed the triage vital signs and the nursing notes.   Differential diagnosis includes, but is not limited to, COVID, flu, RSV, bronchitis, pneumonia.  Patient is awake and alert, hemodynamically stable and afebrile.  He has normal oxygen saturation of 99% on room air.  He demonstrates no increased work of breathing, sensory muscle use.  He has no abdominal tenderness on exam.  Swab obtained in triage is negative for COVID/flu/RSV.  Chest x-ray demonstrates no evidence of pneumonia.  Recommended further evaluation given his constitutional symptoms, the patient requested to be discharged and did not want any further workup done today.  We discussed return precautions and the importance of close outpatient follow-up.  Also discussed symptomatic management.  Patient or stands and agrees with plan.  He was discharged in stable condition.   Patient's presentation is most consistent with acute complicated illness / injury requiring diagnostic workup.  Clinical  Course as of 05/23/22 1408  Fri May 23, 2022  1404 Patient came out of his room and requested to be discharged as he needs to go pick up his daughter.  He does not want any further workup today. [JP]    Clinical Course User Index [JP] Ramia Sidney, Clarnce Flock, PA-C     FINAL CLINICAL IMPRESSION(S) / ED DIAGNOSES   Final diagnoses:  Acute cough     Rx / DC Orders   ED Discharge Orders     None        Note:  This document was prepared using Dragon voice recognition software and may include unintentional dictation errors.   Emeline Gins 05/23/22 1408    Merlyn Lot, MD 05/23/22 [phone]

## 2022-05-23 NOTE — ED Triage Notes (Signed)
Pt presents to ED with c/o of emesis and bodyaches. Pt to triage with steady gait. NAD noted.
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
## 2023-01-04 ENCOUNTER — Emergency Department (HOSPITAL_COMMUNITY): Payer: 59

## 2023-01-04 ENCOUNTER — Emergency Department (HOSPITAL_COMMUNITY): Payer: Self-pay

## 2023-01-04 ENCOUNTER — Other Ambulatory Visit: Payer: Self-pay

## 2023-01-04 ENCOUNTER — Observation Stay (HOSPITAL_COMMUNITY)
Admission: EM | Admit: 2023-01-04 | Discharge: 2023-01-07 | Disposition: A | Discharge status: Home / Self Care | Payer: 59 | Attending: Family Medicine | Admitting: Family Medicine

## 2023-01-04 ENCOUNTER — Encounter (HOSPITAL_COMMUNITY): Payer: Self-pay

## 2023-01-04 DIAGNOSIS — Z1152 Encounter for screening for COVID-19: Secondary | ICD-10-CM | POA: Insufficient documentation

## 2023-01-04 DIAGNOSIS — E876 Hypokalemia: Secondary | ICD-10-CM

## 2023-01-04 DIAGNOSIS — R531 Weakness: Secondary | ICD-10-CM | POA: Diagnosis present

## 2023-01-04 DIAGNOSIS — Z8673 Personal history of transient ischemic attack (TIA), and cerebral infarction without residual deficits: Secondary | ICD-10-CM | POA: Insufficient documentation

## 2023-01-04 DIAGNOSIS — Z8547 Personal history of malignant neoplasm of testis: Secondary | ICD-10-CM | POA: Insufficient documentation

## 2023-01-04 DIAGNOSIS — Z7901 Long term (current) use of anticoagulants: Secondary | ICD-10-CM | POA: Insufficient documentation

## 2023-01-04 DIAGNOSIS — Z87828 Personal history of other (healed) physical injury and trauma: Secondary | ICD-10-CM | POA: Insufficient documentation

## 2023-01-04 DIAGNOSIS — F447 Conversion disorder with mixed symptom presentation: Secondary | ICD-10-CM | POA: Diagnosis not present

## 2023-01-04 DIAGNOSIS — Z79899 Other long term (current) drug therapy: Secondary | ICD-10-CM | POA: Insufficient documentation

## 2023-01-04 DIAGNOSIS — F329 Major depressive disorder, single episode, unspecified: Secondary | ICD-10-CM

## 2023-01-04 DIAGNOSIS — F444 Conversion disorder with motor symptom or deficit: Principal | ICD-10-CM | POA: Diagnosis present

## 2023-01-04 DIAGNOSIS — F17210 Nicotine dependence, cigarettes, uncomplicated: Secondary | ICD-10-CM | POA: Insufficient documentation

## 2023-01-04 DIAGNOSIS — Z7982 Long term (current) use of aspirin: Secondary | ICD-10-CM | POA: Insufficient documentation

## 2023-01-04 DIAGNOSIS — Z609 Problem related to social environment, unspecified: Secondary | ICD-10-CM

## 2023-01-04 LAB — I-STAT CHEM 8, ED
BUN: 12 mg/dL (ref 6–20)
Calcium, Ion: 1.09 mmol/L — ABNORMAL LOW (ref 1.15–1.40)
Chloride: 103 mmol/L (ref 98–111)
Creatinine, Ser: 0.9 mg/dL (ref 0.61–1.24)
Glucose, Bld: 112 mg/dL — ABNORMAL HIGH (ref 70–99)
HCT: 42 % (ref 39.0–52.0)
Hemoglobin: 14.3 g/dL (ref 13.0–17.0)
Potassium: 3.4 mmol/L — ABNORMAL LOW (ref 3.5–5.1)
Sodium: 140 mmol/L (ref 135–145)
TCO2: 22 mmol/L (ref 22–32)

## 2023-01-04 LAB — CBC
HCT: 41.2 % (ref 39.0–52.0)
Hemoglobin: 14.1 g/dL (ref 13.0–17.0)
MCH: 30.7 pg (ref 26.0–34.0)
MCHC: 34.2 g/dL (ref 30.0–36.0)
MCV: 89.6 fL (ref 80.0–100.0)
Platelets: 273 10*3/uL (ref 150–400)
RBC: 4.6 MIL/uL (ref 4.22–5.81)
RDW: 11.9 % (ref 11.5–15.5)
WBC: 8.8 10*3/uL (ref 4.0–10.5)
nRBC: 0 % (ref 0.0–0.2)

## 2023-01-04 LAB — RAPID URINE DRUG SCREEN, HOSP PERFORMED
Amphetamines: NOT DETECTED
Barbiturates: NOT DETECTED
Benzodiazepines: NOT DETECTED
Cocaine: NOT DETECTED
Opiates: NOT DETECTED
Tetrahydrocannabinol: NOT DETECTED

## 2023-01-04 LAB — APTT: aPTT: 30 s (ref 24–36)

## 2023-01-04 LAB — URINALYSIS, ROUTINE W REFLEX MICROSCOPIC
Bilirubin Urine: NEGATIVE
Glucose, UA: NEGATIVE mg/dL
Hgb urine dipstick: NEGATIVE
Ketones, ur: NEGATIVE mg/dL
Nitrite: NEGATIVE
Protein, ur: NEGATIVE mg/dL
Specific Gravity, Urine: 1.041 — ABNORMAL HIGH (ref 1.005–1.030)
pH: 5 (ref 5.0–8.0)

## 2023-01-04 LAB — PROTIME-INR
INR: 0.9 (ref 0.8–1.2)
Prothrombin Time: 12.6 s (ref 11.4–15.2)

## 2023-01-04 LAB — DIFFERENTIAL
Abs Immature Granulocytes: 0.02 10*3/uL (ref 0.00–0.07)
Basophils Absolute: 0.1 10*3/uL (ref 0.0–0.1)
Basophils Relative: 1 %
Eosinophils Absolute: 0.3 10*3/uL (ref 0.0–0.5)
Eosinophils Relative: 3 %
Immature Granulocytes: 0 %
Lymphocytes Relative: 32 %
Lymphs Abs: 2.8 10*3/uL (ref 0.7–4.0)
Monocytes Absolute: 0.8 10*3/uL (ref 0.1–1.0)
Monocytes Relative: 9 %
Neutro Abs: 4.8 10*3/uL (ref 1.7–7.7)
Neutrophils Relative %: 55 %

## 2023-01-04 LAB — COMPREHENSIVE METABOLIC PANEL
ALT: 32 U/L (ref 0–44)
AST: 29 U/L (ref 15–41)
Albumin: 3.9 g/dL (ref 3.5–5.0)
Alkaline Phosphatase: 88 U/L (ref 38–126)
Anion gap: 12 (ref 5–15)
BUN: 11 mg/dL (ref 6–20)
CO2: 22 mmol/L (ref 22–32)
Calcium: 9.1 mg/dL (ref 8.9–10.3)
Chloride: 106 mmol/L (ref 98–111)
Creatinine, Ser: 0.92 mg/dL (ref 0.61–1.24)
GFR, Estimated: >60 mL/min (ref >60)
Glucose, Bld: 114 mg/dL — ABNORMAL HIGH (ref 70–99)
Potassium: 3.5 mmol/L (ref 3.5–5.1)
Sodium: 140 mmol/L (ref 135–145)
Total Bilirubin: 0.6 mg/dL (ref 0.3–1.2)
Total Protein: 7.4 g/dL (ref 6.5–8.1)

## 2023-01-04 LAB — ETHANOL: Alcohol, Ethyl (B): <10 mg/dL (ref <10)

## 2023-01-04 LAB — HIV ANTIBODY (ROUTINE TESTING W REFLEX): HIV Screen 4th Generation wRfx: NONREACTIVE

## 2023-01-04 LAB — SARS CORONAVIRUS 2 BY RT PCR: SARS Coronavirus 2 by RT PCR: NEGATIVE

## 2023-01-04 LAB — CBG MONITORING, ED: Glucose-Capillary: 96 mg/dL (ref 70–99)

## 2023-01-04 MED ORDER — ASPIRIN 81 MG PO TBEC: 81.0000 mg | DELAYED_RELEASE_TABLET | Freq: Every day | ORAL | Status: DC

## 2023-01-04 MED ORDER — ACETAMINOPHEN 500 MG PO TABS
1000.0000 mg | ORAL_TABLET | Freq: Four times a day (QID) | ORAL | Status: DC | PRN
  Administered 2023-01-05 – 2023-01-06 (×4): 1000 mg via ORAL
  Filled 2023-01-04 (×4): qty 2

## 2023-01-04 MED ORDER — POTASSIUM CHLORIDE 20 MEQ PO PACK
40.0000 meq | PACK | Freq: Once | ORAL | Status: AC
  Administered 2023-01-04: 40 meq via ORAL
  Filled 2023-01-04: qty 2

## 2023-01-04 MED ORDER — ASPIRIN 81 MG PO TBEC
81.0000 mg | DELAYED_RELEASE_TABLET | Freq: Every day | ORAL | Status: DC
  Administered 2023-01-04 – 2023-01-07 (×4): 81 mg via ORAL
  Filled 2023-01-04 (×4): qty 1

## 2023-01-04 MED ORDER — ATORVASTATIN CALCIUM 80 MG PO TABS
80.0000 mg | ORAL_TABLET | Freq: Every day | ORAL | Status: DC
  Administered 2023-01-04 – 2023-01-06 (×3): 80 mg via ORAL
  Filled 2023-01-04 (×3): qty 1

## 2023-01-04 MED ORDER — IOHEXOL 350 MG/ML SOLN
75.0000 mL | Freq: Once | INTRAVENOUS | Status: AC | PRN
  Administered 2023-01-04: 75 mL via INTRAVENOUS

## 2023-01-04 MED ORDER — LISINOPRIL 10 MG PO TABS
10.0000 mg | ORAL_TABLET | Freq: Every day | ORAL | Status: DC
  Administered 2023-01-04 – 2023-01-07 (×4): 10 mg via ORAL
  Filled 2023-01-04 (×4): qty 1

## 2023-01-04 MED ORDER — APIXABAN 5 MG PO TABS
5.0000 mg | ORAL_TABLET | Freq: Two times a day (BID) | ORAL | Status: DC
  Administered 2023-01-04 – 2023-01-07 (×6): 5 mg via ORAL
  Filled 2023-01-04 (×6): qty 1

## 2023-01-04 MED ORDER — PRAZOSIN HCL 2 MG PO CAPS
2.0000 mg | ORAL_CAPSULE | Freq: Every day | ORAL | Status: DC
  Administered 2023-01-04 – 2023-01-06 (×3): 2 mg via ORAL
  Filled 2023-01-04 (×5): qty 1

## 2023-01-04 MED ORDER — POTASSIUM CHLORIDE CRYS ER 20 MEQ PO TBCR: 40.0000 meq | EXTENDED_RELEASE_TABLET | Freq: Once | ORAL | Status: DC

## 2023-01-04 MED ORDER — MAGNESIUM SULFATE IN D5W 1-5 GM/100ML-% IV SOLN
1.0000 g | Freq: Once | INTRAVENOUS | Status: AC
  Administered 2023-01-04: 1 g via INTRAVENOUS
  Filled 2023-01-04: qty 100

## 2023-01-04 MED ORDER — ACETAMINOPHEN 500 MG PO TABS
1000.0000 mg | ORAL_TABLET | Freq: Once | ORAL | Status: AC
  Administered 2023-01-04: 1000 mg via ORAL
  Filled 2023-01-04: qty 2

## 2023-01-04 MED ORDER — METOCLOPRAMIDE HCL 5 MG/ML IJ SOLN
10.0000 mg | Freq: Once | INTRAMUSCULAR | Status: AC
  Administered 2023-01-04: 10 mg via INTRAVENOUS
  Filled 2023-01-04: qty 2

## 2023-01-04 MED ORDER — BACLOFEN 10 MG PO TABS
10.0000 mg | ORAL_TABLET | Freq: Once | ORAL | Status: AC
  Administered 2023-01-04: 10 mg via ORAL
  Filled 2023-01-04: qty 1

## 2023-01-04 NOTE — Evaluation (Signed)
Clinical/Bedside Swallow Evaluation Patient Details  Name: Matthew Armstrong. MRN: [id number] Date of Birth: [birth year]  Today's Date: 01/04/2023 Time: SLP Start Time (ACUTE ONLY): 1054 SLP Stop Time (ACUTE ONLY): 1106 SLP Time Calculation (min) (ACUTE ONLY): 12 min  Past Medical History:  Past Medical History:  Diagnosis Date   Bladder disorder    Past Surgical History:  Past Surgical History:  Procedure Laterality Date   BACK SURGERY     HPI:  Matthew Armstrong. is a 42 y.o. male who presented from home via EMS for acute onset of left side weakness and aphasia.  MRI normal, code stroke canceled. Pt with past medical history of A fib on Eliquis, HTN, anxiety, GERD, spinal cord injury with neurogenic bladder, PTSD, prior CVA, functional neurological symptoms disorder with weakness, chronic pain    Assessment / Plan / Recommendation  Clinical Impression  Pt presents with grossly normal swallowing as assessed clinically.  Pt initially passed yale swallow screen, but then had difficulty taking medications with coughing/choking noted and made NPO for ST swallow eval.  Pt with L assymetry and reduced facial sensation.  Pt with reduced mandibular excursion initially, but improved with ecouragement.  Difficult to assess lingual ROM and strength 2/2 L sided labial closure. Pt tolerated thin liquid by straw, puree, and regular solid texture without any s/s of aspiration. Pt exhbited good oral clearance of solids.  Pt with dysarthria noted, but remained intelligible at word/phrase level.  MRI normal.  Hopeful for resolution of symptoms.  Pt denies word finding difficulties and his comprehension was adequate during today's evaluation.  If dysarthria symptoms persist, consider speech language evaluation.    Recommend resuming regular texture diet with thin liquids.  Administer medications whole with puree, or crush if needed.   SLP Visit Diagnosis: Dysphagia, unspecified (R13.10)    Aspiration  Risk  No limitations    Diet Recommendation Regular;Thin liquid    Liquid Administration via: Straw Medication Administration: Whole meds with puree (crush as needed) Supervision: Patient able to self feed Compensations: Slow rate;Small sips/bites Postural Changes: Seated upright at 90 degrees    Other  Recommendations Oral Care Recommendations: Oral care BID    Recommendations for follow up therapy are one component of a multi-disciplinary discharge planning process, led by the attending physician.  Recommendations may be updated based on patient status, additional functional criteria and insurance authorization.  Follow up Recommendations No SLP follow up      Assistance Recommended at Discharge    Functional Status Assessment Patient has had a recent decline in their functional status and demonstrates the ability to make significant improvements in function in a reasonable and predictable amount of time.  Frequency and Duration min 2x/week  2 weeks       Prognosis Prognosis for improved oropharyngeal function: Good      Swallow Study   General Date of Onset: 01/04/23 HPI: Matthew Armstrong. is a 42 y.o. male who presented from home via EMS for acute onset of left side weakness and aphasia.  MRI normal, code stroke canceled. Pt with past medical history of A fib on Eliquis, HTN, anxiety, GERD, spinal cord injury with neurogenic bladder, PTSD, prior CVA, functional neurological symptoms disorder with weakness, chronic pain Type of Study: Bedside Swallow Evaluation Previous Swallow Assessment: None Diet Prior to this Study: NPO Temperature Spikes Noted: No Respiratory Status: Room air History of Recent Intubation: No Behavior/Cognition: Alert;Cooperative;Pleasant mood Oral Cavity Assessment: Within Functional Limits Oral Care Completed  by SLP: No Oral Cavity - Dentition: Adequate natural dentition Vision: Functional for self-feeding Self-Feeding Abilities: Able to feed  self Patient Positioning: Upright in bed Baseline Vocal Quality: Low vocal intensity Volitional Cough: Strong Volitional Swallow: Able to elicit    Oral/Motor/Sensory Function Overall Oral Motor/Sensory Function: Mild impairment Facial ROM: Reduced left Facial Symmetry: Abnormal symmetry right Facial Sensation: Reduced left Lingual ROM: Reduced left Lingual Symmetry: Abnormal symmetry left Lingual Strength: Reduced Velum:  (uanble to test) Mandible:  (reduced excursion)   Ice Chips Ice chips: Not tested   Thin Liquid Thin Liquid: Within functional limits Presentation: Straw    Nectar Thick Nectar Thick Liquid: Not tested   Honey Thick Honey Thick Liquid: Not tested   Puree Puree: Within functional limits   Solid     Solid: Within functional limits Presentation: Self Fed      Kerrie Pleasure, MA, CCC-SLP Acute Rehabilitation Services Office: [phone] 01/04/2023,11:26 AM

## 2023-01-04 NOTE — Progress Notes (Signed)
Received patient from the ED via stretcher; reporting headache and back of head pain not relieved by tylenol; will notify MD. Patient oriented to room and unit routine.

## 2023-01-04 NOTE — Progress Notes (Addendum)
FMTS Brief Progress Note  S:  On interview, patient resting comfortably upright in bed.  Primary concern at this time right sided occipital pulsating headache worse on movement, unrelieved by PO Tylenol 1 g x2.  Began earlier this morning concurrently with left-sided motor and sensory deficits.  No previous history of headache.  Requested different pain medication.  Agreed with baclofen, as it appears MSK related.  Eating well, drinking well. No new concerns at this time.  O: BP 126/86 (BP Location: Left Arm)   Pulse 78   Temp 98.1 F (36.7 C) (Oral)   Resp 17   SpO2 100%    Constitutional: Well appearing male sitting up in bed, no acute distress. Cardiovascular: Regular rate and rhythm, no murmurs rubs or gallops. Respiratory: Anterior lung fields clear to auscultation bilaterally. GI: No tenderness to light palpation in 4 quadrants. MSK: Left-sided sensory deficits and 0 out of 5 strength in left upper and lower extremities.  Strength otherwise normal. Neuro: Patient cannot fully open mouth on command.  Sticks tongue out on right side.  Full ocular ROM.  Hoover's sign negative.  Psych: Appropriate mood and affect.  Attentive to interview.  A/P: Right-sided occipital headache Likely MSK related.  Consistent with tension headache.   - Ordered baclofen 10 mg x1. - If condition changes, plan includesrepeat PRN Tylenol 1 g after 10 PM +/- second dose of baclofen.  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 Leading diagnosis continues to be functional neurological symptomatology. - Continue plan per day team.  Hypokalemia -A.m. BMP ordered.  Tomie China, MD 01/04/2023, 8:08 PM PGY-1, [facility] Night Resident  Please page [phone] with questions.
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
## 2023-01-04 NOTE — ED Notes (Signed)
Patient passed swallow screen with no issue, but when taking tylenol got choked. SLP order placed.

## 2023-01-04 NOTE — Assessment & Plan Note (Addendum)
K on admission is 3.4. -oral potassium repletion -A.m. BMP

## 2023-01-04 NOTE — ED Notes (Signed)
Pt well appearing upon transport to floor.

## 2023-01-04 NOTE — ED Triage Notes (Signed)
Pt BIB EMS due to code stroke.LSN 0740. Deficits- left sided weakness, aphasia, left sided facial droop.  HX of stroke. Takes eliquis last took last night. BP-172/84 .

## 2023-01-04 NOTE — ED Notes (Signed)
Transfer of care report received from previous medic, [employer].

## 2023-01-04 NOTE — Assessment & Plan Note (Addendum)
Patient presentation most consistent with this diagnosis, given physical exam findings of waxing and waning deficits when distracted as well as history of PTSD, recent change in medications, and recent stressors.  Would benefit from admission for psychiatric evaluation as well as PT. -Admit to FMTS, Dr. Pollie Meyer is attending -Consult to Psychiatry -Consult to physical therapy -continue home Prazosin -Will likely need records from patient's present to further elucidate health history and needs

## 2023-01-04 NOTE — Code Documentation (Signed)
Stroke Response Nurse Documentation Code Documentation  Matthew Armstrong. is a 42 y.o. male arriving to [facility] for Stroke activation via [facility]. History of afib on Eliquis, HTN, SCI with neurogenic bladder, CVA, GERD, anxiety. LKW 0740 when he was smoking a cigarette and suddenly developed left sided weakness and slurred speech. EMS was called.   Stroke team at the bedside on patient arrival. Labs drawn and patient cleared for CT by EDP, taken to CT with team. NIH 18, see flowsheets for details.  CT, CTA, MRI completed. Care Plan: cancel. Handoff with ED Lyda Perone.  Scarlette Slice K  Rapid Response RN

## 2023-01-04 NOTE — Consult Note (Addendum)
Neurology Consultation  Reason for Consult: Code stroke  Referring Physician: Dr. Theresia Lo  CC: left side weakness and aphasia  History is obtained from:medical record and EMS  HPI: Matthew Armstrong. is a 42 y.o. male with past medical history of A fib on Eliquis, HTN, anxiety, GERD, spinal cord injury with neurogenic bladder, PTSD, prior CVA, functional neurological symptoms disorder with weakness, chronic pain who presents from home via EMS for acute onset of left side weakness and aphasia. NIHSS 18. Code stroke CT head with no acute process, aspects 10. CTA head and neck with no LVO. Patient taken STAT to MRI for treatment decision. MRI brain with no acute process. Stroke cancelled at by Dr. Marjory Sneddon: [phone] IV thrombolysis given?: no, not a stroke  EVT:  No LVO Premorbid modified Rankin scale (mRS):  1-No significant post stroke disability and can perform usual duties with stroke symptoms   ROS:  Unable to obtain due to altered mental status.   Past Medical History:  Diagnosis Date   Bladder disorder       No family history on file.    Social History:   reports that he has been smoking cigarettes. He has never used smokeless tobacco. He reports current alcohol use. He reports that he does not use drugs.   Medications No current facility-administered medications for this encounter.  Current Outpatient Medications:    benzonatate (TESSALON PERLES) 100 MG capsule, Take 1 capsule (100 mg total) by mouth 3 (three) times daily as needed for cough., Disp: 30 capsule, Rfl: 0    Exam: Current vital signs: BP 134/89   Pulse 94   Resp 20   SpO2 98%  Vital signs in last 24 hours: Pulse Rate:  [94] 94 (10/20 0821) Resp:  [20] 20 (10/20 0821) BP: (134)/(89) 134/89 (10/20 0821) SpO2:  [98 %] 98 % (10/20 0821)  GENERAL: Awake, alert in NAD HEENT: - Normocephalic and atraumatic, dry mm LUNGS - Clear to auscultation bilaterally with no wheezes CV - S1S2 RRR, no m/r/g, equal  pulses bilaterally. ABDOMEN - Soft, nontender, nondistended with normoactive BS Ext: warm, well perfused, intact peripheral pulses, no edema  NEURO:  Mental Status: awake and alert, speech is mumbled and severely dysarthric. Follow commands Language: speech is dysarthric.   Cranial Nerves: PERRL EOMI , visual fields full ,left facial asymmetry ,  facial sensation intact, hearing intact, tongue/uvula/soft palate midline, normal  sternocleidomastoid and trapezius muscle strength. No evidence of tongue atrophy or fibrillations  Motor:  right arm and leg antigravity but can not sustain gravity and drift towards bed, left arm drops to bed, and left leg with drift Tone: is normal and bulk is normal  Sensation- decreased on left  Coordination: FTN intact bilaterally, no ataxia in BLE. Gait- deferred  NIHSS  1a Level of Conscious.: 0 1b LOC Questions: 2 1c LOC Commands: 0 2 Best Gaze: 0 3 Visual: 0 4 Facial Palsy: 2 5a Motor Arm - left: 3 5b Motor Arm - Right: 1 6a Motor Leg - Left: 2 6b Motor Leg - Right: 1 7 Limb Ataxia: 2 8 Sensory: 2 9 Best Language: 1 10 Dysarthria: 2 11 Extinct. and Inatten.: 0 TOTAL: 18   Labs I have reviewed labs in epic and the results pertinent to this consultation are:   CBC    Component Value Date/Time   WBC 12.3 (H) 03/20/2022 1619   RBC 5.05 03/20/2022 1619   HGB 14.3 01/04/2023 0823   HCT 42.0  01/04/2023 0823   PLT 295 03/20/2022 1619   MCV 91.7 03/20/2022 1619   MCH 31.1 03/20/2022 1619   MCHC 33.9 03/20/2022 1619   RDW 13.4 03/20/2022 1619   LYMPHSABS 3.1 08/04/2020 1702   MONOABS 0.6 08/04/2020 1702   EOSABS 0.1 08/04/2020 1702   BASOSABS 0.0 08/04/2020 1702    CMP     Component Value Date/Time   NA 140 01/04/2023 0823   K 3.4 (L) 01/04/2023 0823   CL 103 01/04/2023 0823   CO2 26 03/20/2022 1619   GLUCOSE 112 (H) 01/04/2023 0823   BUN 12 01/04/2023 0823   CREATININE 0.90 01/04/2023 0823   CALCIUM 9.4 03/20/2022 1619   PROT  8.1 08/04/2020 1702   ALBUMIN 4.6 08/04/2020 1702   AST 25 08/04/2020 1702   ALT 18 08/04/2020 1702   ALKPHOS 92 08/04/2020 1702   BILITOT 1.1 08/04/2020 1702   GFRNONAA >60 03/20/2022 1619   GFRAA >60 10/14/2019 2215    Lipid Panel  No results found for: "CHOL", "TRIG", "HDL", "CHOLHDL", "VLDL", "LDLCALC", "LDLDIRECT"  No results found for: "HGBA1C"    Imaging I have reviewed the images obtained:  CT-head with no acute process  CTA head and neck with no LVO   MRI examination of the brain with no acute process   Assessment:  42 y.o. male with past medical history of A fib on Eliquis, HTN, anxiety, GERD, spinal cord injury with neurogenic bladder, PTSD, prior CVA, functional neurological symptoms disorder with weakness, chronic pain who presents from home via EMS for acute onset of left side weakness and aphasia. NIHSS 18.  Functional neurological disorder-with mixed symptoms of motor and sensory loss as well as speech disturbance  Recommendations: Code stroke cancelled Neurology will signoff. Please call with questions or concerns   Gevena Mart DNP, ACNPC-AG  [facility]  Attending addendum Patient seen and examined as a code stroke for left-sided weakness and slurred speech. He has a past medical history of atrial fibrillation on Eliquis, hypertension, spinal cord injury with neurogenic bladder, prior history of strokelike episodes with negative imaging-likely functional neurological disorder with paralysis for which she has been seen at multiple medical facilities around the area including here at [facility] by myself at 2021, coming in with similar symptoms that have been documented before. His examination, as documented above has multiple functional findings and evidence of a volitional facial drawing and volitional weakness on the left side. That said, given the fact that he has risk factors, after the noncontrasted head CT and CT angiography head  and neck were unremarkable, he was taken for a stat MRI head to rule out any acute stroke.  There was no evidence of acute stroke on the MRI brain on my review and also on the formal radiology reading. His symptoms are likely related to functional neurological disorder with paralysis and sensory symptoms.  At this time, no further inpatient neurological workup is recommended.  He should continue his outpatient medications and follow-up with his outpatient providers.  If he does not already have a psychiatrist, he should be referred to a psychiatrist for ongoing care because these functional neurological deficits are best treated by psychiatry and do not require any neurological intervention. Plan was discussed with Dr. Theresia Lo. Please call with questions as needed -- Milon Dikes, MD Attending Neurologist

## 2023-01-04 NOTE — ED Notes (Signed)
ED TO INPATIENT HANDOFF REPORT  ED Nurse Name and Phone #: Murlean Iba Paramedic [phone]  S Name/Age/Gender Matthew Armstrong. 42 y.o. male Room/Bed: 018C/018C  Code Status   Code Status: Not on file  Home/SNF/Other Home Patient oriented to: self, place, time, and situation Is this baseline? 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 Deficits- left sided weakness, aphasia, left sided facial droop.  HX of stroke. Takes eliquis last took last night. BP-172/84 .    Allergies Allergies  Allergen Reactions   Ciprofloxacin    Clindamycin/Lincomycin    Ibuprofen    Naproxen    Nsaids    Tetracyclines & Related     Level of Care/Admitting Diagnosis ED Disposition     ED Disposition  Admit   Condition  --   Comment  Hospital Area: 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 [[id number]]  Admitting Physician: Gerrit Heck [[id number]]  Attending Physician: Latrelle Dodrill [phone]          B Medical/Surgery History Past Medical History:  Diagnosis Date   Bladder disorder    Past Surgical History:  Procedure Laterality Date   BACK SURGERY       A IV Location/Drains/Wounds Patient Lines/Drains/Airways Status     Active Line/Drains/Airways     Name Placement date Placement time Site Days   Peripheral IV 01/04/23 18 G Left Antecubital 01/04/23  --  Antecubital  less than 1   Peripheral IV 01/04/23 18 G Right Antecubital 01/04/23  --  Antecubital  less than 1            Intake/Output Last 24  hours  Intake/Output Summary (Last 24 hours) at 01/04/2023 1529 Last data filed at 01/04/2023 1054 Gross per 24 hour  Intake 100 ml  Output --  Net 100 ml    Labs/Imaging Results for orders placed or performed during the hospital encounter of 01/04/23 (from the past 48 hour(s))  CBG monitoring, ED     Status: None   Collection Time: 01/04/23  8:17 AM  Result Value Ref Range   Glucose-Capillary 96 70 - 99 mg/dL    Comment: Glucose reference range applies only to samples taken after fasting for at least 8 hours.  Ethanol     Status: None   Collection Time: 01/04/23  8:18 AM  Result Value Ref Range   Alcohol, Ethyl (B) <10 <10 mg/dL    Comment: (NOTE) Lowest detectable limit for serum alcohol is 10 mg/dL.  For medical purposes only. Performed at [facility], [address] [place], [place] [address]   Protime-INR     Status: None   Collection Time: 01/04/23  8:18 AM  Result Value Ref Range   Prothrombin Time 12.6 11.4 - 15.2 seconds   INR 0.9 0.8 - 1.2    Comment: (NOTE) INR goal varies based on device and disease states. Performed at [facility], [address] [place], [place] [address]   APTT     Status: None   Collection Time: 01/04/23  8:18 AM  Result Value Ref  Range   aPTT 30 24 - 36 seconds    Comment: Performed at [facility], [address] [place], [place] [address]  CBC     Status: None   Collection Time: 01/04/23  8:18 AM  Result Value Ref Range   WBC 8.8 4.0 - 10.5 K/uL   RBC 4.60 4.22 - 5.81 MIL/uL   Hemoglobin 14.1 13.0 - 17.0 g/dL   HCT 52.8 41.3 - 24.4 %   MCV 89.6 80.0 - 100.0 fL   MCH 30.7 26.0 - 34.0 pg   MCHC 34.2 30.0 - 36.0 g/dL   RDW 01.0 27.2 - 53.6 %   Platelets 273 150 - 400 K/uL   nRBC 0.0 0.0 - 0.2 %    Comment: Performed at [facility], [address] [place], [place] [address]  Differential     Status: None   Collection Time: 01/04/23  8:18 AM  Result Value Ref Range   Neutrophils Relative % 55 %    Neutro Abs 4.8 1.7 - 7.7 K/uL   Lymphocytes Relative 32 %   Lymphs Abs 2.8 0.7 - 4.0 K/uL   Monocytes Relative 9 %   Monocytes Absolute 0.8 0.1 - 1.0 K/uL   Eosinophils Relative 3 %   Eosinophils Absolute 0.3 0.0 - 0.5 K/uL   Basophils Relative 1 %   Basophils Absolute 0.1 0.0 - 0.1 K/uL   Immature Granulocytes 0 %   Abs Immature Granulocytes 0.02 0.00 - 0.07 K/uL    Comment: Performed at [facility], [address], [place], [place] [address]  Comprehensive metabolic panel     Status: Abnormal   Collection Time: 01/04/23  8:18 AM  Result Value Ref Range   Sodium 140 135 - 145 mmol/L   Potassium 3.5 3.5 - 5.1 mmol/L   Chloride 106 98 - 111 mmol/L   CO2 22 22 - 32 mmol/L   Glucose, Bld 114 (H) 70 - 99 mg/dL    Comment: Glucose reference range applies only to samples taken after fasting for at least 8 hours.   BUN 11 6 - 20 mg/dL   Creatinine, Ser 9.56 0.61 - 1.24 mg/dL   Calcium 9.1 8.9 - 38.7 mg/dL   Total Protein 7.4 6.5 - 8.1 g/dL   Albumin 3.9 3.5 - 5.0 g/dL   AST 29 15 - 41 U/L   ALT 32 0 - 44 U/L   Alkaline Phosphatase 88 38 - 126 U/L   Total Bilirubin 0.6 0.3 - 1.2 mg/dL   GFR, Estimated >56 >43 mL/min    Comment: (NOTE) Calculated using the CKD-EPI Creatinine Equation (2021)    Anion gap 12 5 - 15    Comment: Performed at [facility], [address] [place], [place] [address]  I-stat chem 8, ED     Status: Abnormal   Collection Time: 01/04/23  8:23 AM  Result Value Ref Range   Sodium 140 135 - 145 mmol/L   Potassium 3.4 (L) 3.5 - 5.1 mmol/L   Chloride 103 98 - 111 mmol/L   BUN 12 6 - 20 mg/dL   Creatinine, Ser 8.84 0.61 - 1.24 mg/dL   Glucose, Bld 166 (H) 70 - 99 mg/dL    Comment: Glucose reference range applies only to samples taken after fasting for at least 8 hours.   Calcium, Ion 1.09 (L) 1.15 - 1.40 mmol/L   TCO2 22 22 - 32 mmol/L   Hemoglobin 14.3 13.0 - 17.0  g/dL   HCT 11.9 14.7 - 82.9 %  Urine rapid drug screen (hosp performed)      Status: None   Collection Time: 01/04/23  9:39 AM  Result Value Ref Range   Opiates NONE DETECTED NONE DETECTED   Cocaine NONE DETECTED NONE DETECTED   Benzodiazepines NONE DETECTED NONE DETECTED   Amphetamines NONE DETECTED NONE DETECTED   Tetrahydrocannabinol NONE DETECTED NONE DETECTED   Barbiturates NONE DETECTED NONE DETECTED    Comment: (NOTE) DRUG SCREEN FOR MEDICAL PURPOSES ONLY.  IF CONFIRMATION IS NEEDED FOR ANY PURPOSE, NOTIFY LAB WITHIN 5 DAYS.  LOWEST DETECTABLE LIMITS FOR URINE DRUG SCREEN Drug Class                     Cutoff (ng/mL) Amphetamine and metabolites    1000 Barbiturate and metabolites    200 Benzodiazepine                 200 Opiates and metabolites        300 Cocaine and metabolites        300 THC                            50 Performed at [facility], [address], [place], [place] [address]   Urinalysis, Routine w reflex microscopic -Urine, Clean Catch     Status: Abnormal   Collection Time: 01/04/23  9:39 AM  Result Value Ref Range   Color, Urine YELLOW YELLOW   APPearance CLEAR CLEAR   Specific Gravity, Urine 1.041 (H) 1.005 - 1.030   pH 5.0 5.0 - 8.0   Glucose, UA NEGATIVE NEGATIVE mg/dL   Hgb urine dipstick NEGATIVE NEGATIVE   Bilirubin Urine NEGATIVE NEGATIVE   Ketones, ur NEGATIVE NEGATIVE mg/dL   Protein, ur NEGATIVE NEGATIVE mg/dL   Nitrite NEGATIVE NEGATIVE   Leukocytes,Ua TRACE (A) NEGATIVE   RBC / HPF 0-5 0 - 5 RBC/hpf   WBC, UA 11-20 0 - 5 WBC/hpf   Bacteria, UA RARE (A) NONE SEEN   Squamous Epithelial / HPF 0-5 0 - 5 /HPF   Mucus PRESENT     Comment: Performed at [facility], [address] [place], [place] [address]  SARS Coronavirus 2 by RT PCR (hospital order, performed in [facility] hospital lab) *cepheid single result test* Anterior Nasal Swab     Status: None   Collection Time: 01/04/23 10:45 AM   Specimen: Anterior Nasal Swab  Result Value Ref Range   SARS Coronavirus 2 by RT PCR NEGATIVE NEGATIVE     Comment: Performed at [facility], [address], [place], [place] [address]   MR BRAIN WO CONTRAST  Result Date: 01/04/2023 CLINICAL DATA:  Stroke follow-up EXAM: MRI HEAD WITHOUT CONTRAST TECHNIQUE: Multiplanar, multiecho pulse sequences of the brain and surrounding structures were obtained without intravenous contrast. COMPARISON:  Head CT and CTA from earlier today FINDINGS: Brain: No acute infarction, hemorrhage, hydrocephalus, extra-axial collection or mass lesion. No white matter disease or atrophy. Vascular: Normal flow voids. Skull and upper cervical spine: Normal marrow signal. Sinuses/Orbits: Negative. IMPRESSION: Normal brain MRI. Electronically Signed   By: Tiburcio Pea M.D.   On: 01/04/2023 09:20   CT ANGIO HEAD NECK W WO CM (CODE STROKE)  Result Date: 01/04/2023 CLINICAL DATA:  Stroke workup. EXAM: CT ANGIOGRAPHY HEAD AND NECK WITH AND WITHOUT CONTRAST TECHNIQUE: Multidetector CT imaging of the head and neck  was performed using the standard protocol during bolus administration of intravenous contrast. Multiplanar CT image reconstructions and MIPs were obtained to evaluate the vascular anatomy. Carotid stenosis measurements (when applicable) are obtained utilizing NASCET criteria, using the distal internal carotid diameter as the denominator. RADIATION DOSE REDUCTION: This exam was performed according to the departmental dose-optimization program which includes automated exposure control, adjustment of the mA and/or kV according to patient size and/or use of iterative reconstruction technique. CONTRAST:  75mL OMNIPAQUE IOHEXOL 350 MG/ML SOLN COMPARISON:  Head CT from earlier today FINDINGS: CTA NECK FINDINGS Aortic arch: Normal Right carotid system: Vessels are smoothly contoured and widely patent without atheromatous change. Left carotid system: Vessels are smoothly contoured and widely patent without atheromatous change. Vertebral arteries: Vessels are smoothly contoured and  widely patent without atheromatous change. Skeleton: Unremarkable Other neck: Unremarkable Upper chest: Clear apical lungs Review of the MIP images confirms the above findings CTA HEAD FINDINGS Anterior circulation: No significant stenosis, proximal occlusion, aneurysm, or vascular malformation. Posterior circulation: No significant stenosis, proximal occlusion, aneurysm, or vascular malformation. Venous sinuses: As permitted by contrast timing, patent. Anatomic variants: Fetal type right PCA. Review of the MIP images confirms the above findings IMPRESSION: Negative CTA of the head and neck. Electronically Signed   By: Tiburcio Pea M.D.   On: 01/04/2023 08:38   CT HEAD CODE STROKE WO CONTRAST  Result Date: 01/04/2023 CLINICAL DATA:  Code stroke. Neuro deficit with acute stroke suspected EXAM: CT HEAD WITHOUT CONTRAST TECHNIQUE: Contiguous axial images were obtained from the base of the skull through the vertex without intravenous contrast. RADIATION DOSE REDUCTION: This exam was performed according to the departmental dose-optimization program which includes automated exposure control, adjustment of the mA and/or kV according to patient size and/or use of iterative reconstruction technique. COMPARISON:  Brain MRI 10/14/2019 FINDINGS: Brain: No evidence of acute infarction, hemorrhage, hydrocephalus, extra-axial collection or mass lesion/mass effect. Vascular: No hyperdense vessel or unexpected calcification. Skull: Normal. Negative for fracture or focal lesion. Sinuses/Orbits: No acute finding. Other: Prelim sent in epic chat ASPECTS [facility] Stroke Program Early CT Score) - Ganglionic level infarction (caudate, lentiform nuclei, internal capsule, insula, M1-M3 cortex): 7 - Supraganglionic infarction (M4-M6 cortex): 3 Total score (0-10 with 10 being normal): 10 IMPRESSION: Negative head CT Electronically Signed   By: Tiburcio Pea M.D.   On: 01/04/2023 08:27    Pending Labs Unresulted Labs (From  admission, onward)    None       Vitals/Pain Today's Vitals   01/04/23 1245 01/04/23 1330 01/04/23 1333 01/04/23 1515  BP: 115/89 135/87  (!) 126/93  Pulse: 68 77  68  Resp: 13 18  (!) 21  Temp:   98.2 F (36.8 C)   TempSrc:   Oral   SpO2: 100% 100%  100%  PainSc:        Isolation Precautions Airborne and Contact precautions  Medications Medications  iohexol (OMNIPAQUE) 350 MG/ML injection 75 mL (75 mLs Intravenous Contrast Given 01/04/23 0831)  metoCLOPramide (REGLAN) injection 10 mg (10 mg Intravenous Given 01/04/23 0954)  acetaminophen (TYLENOL) tablet 1,000 mg (1,000 mg Oral Given 01/04/23 0954)  magnesium sulfate IVPB 1 g 100 mL (0 g Intravenous Stopped 01/04/23 1054)    Mobility non-ambulatory     Focused Assessments Neuro Assessment Handoff:  Swallow screen pass? Yes    NIH Stroke Scale  Dizziness Present: No Headache Present: No Interval: Other (Comment) Level of Consciousness (1a.)   : Alert, keenly responsive LOC Questions (1b. )   :  Answers both questions correctly LOC Commands (1c. )   : Performs both tasks correctly Best Gaze (2. )  : Normal Visual (3. )  : No visual loss Facial Palsy (4. )    : Partial paralysis  Motor Arm, Left (5a. )   : Some effort against gravity Motor Arm, Right (5b. ) : Drift Motor Leg, Left (6a. )  : Some effort against gravity Motor Leg, Right (6b. ) : Drift Limb Ataxia (7. ): Present in two limbs Sensory (8. )  : Severe to total sensory loss, patient is not aware of being touched in the face, arm, and leg Best Language (9. )  : Mild-to-moderate aphasia Dysarthria (10. ): Mild-to-moderate dysarthria, patient slurs at least some words and, at worst, can be understood with some difficulty Extinction/Inattention (11.)   : No Abnormality Complete NIHSS TOTAL: 14 Last date known well: 01/04/23 Last time known well: 0740 Neuro Assessment:   Neuro Checks:   Initial (01/04/23 0830)  Has TPA been given? No If patient is a  Neuro Trauma and patient is going to OR before floor call report to 4N Charge nurse: [phone] or [phone]   R Recommendations: See Admitting Provider Note  Report given to:   Additional Notes:

## 2023-01-04 NOTE — ED Provider Notes (Signed)
[facility] AT [facility] Provider Note   CSN: [id number] Arrival date & time: 01/04/23  4098  An emergency department physician performed an initial assessment on this suspected stroke patient at 0815.  History  Chief Complaint  Patient presents with  . Code Stroke    Matthew Armstrong. is a 42 y.o. male.  Patient is a 42 year old male with a past medical history of A-fib on Eliquis, PTSD, functional neurologic disorder, neurogenic bladder presenting to the emergency department as a code stroke.  Per EMS patient woke up feeling unwell and was found to have left-sided weakness and 911 was called.  Patient's family at bedside states that he has been trembling in his sleep 2 nights ago and she thought he was having nightmares but otherwise was feeling well yesterday.  She states when he woke up this morning he is trembling again but asked for a cigarette.  She states that after she got up she noticed that he had left-sided facial droop and left-sided weakness and called 911.  The patient does report a headache this morning.  She reports that he had been complaining of bilateral flank pain.  She denies any recent fever, nausea, vomiting.  The history is provided by the patient, the spouse and the EMS personnel. The history is limited by the condition of the patient.       Home Medications Prior to Admission medications   Medication Sig Start Date End Date Taking? Authorizing Provider  benzonatate (TESSALON PERLES) 100 MG capsule Take 1 capsule (100 mg total) by mouth 3 (three) times daily as needed for cough. 04/30/22 04/30/23  Concha Se, MD      Allergies    Ciprofloxacin, Clindamycin/lincomycin, Ibuprofen, Naproxen, Nsaids, and Tetracyclines & related    Review of Systems   Review of Systems  Physical Exam Updated Vital Signs BP 126/84   Pulse 77   Resp 20   SpO2 100%  Physical Exam Vitals and nursing note reviewed.  Constitutional:       General: He is not in acute distress.    Appearance: Normal appearance.  HENT:     Head: Normocephalic and atraumatic.     Nose: Nose normal.     Mouth/Throat:     Mouth: Mucous membranes are moist.     Pharynx: Oropharynx is clear.  Eyes:     Extraocular Movements: Extraocular movements intact.     Conjunctiva/sclera: Conjunctivae normal.     Pupils: Pupils are equal, round, and reactive to light.  Cardiovascular:     Rate and Rhythm: Normal rate and regular rhythm.     Pulses: Normal pulses.     Heart sounds: Normal heart sounds.  Pulmonary:     Effort: Pulmonary effort is normal.     Breath sounds: Normal breath sounds.  Abdominal:     General: Abdomen is flat.     Palpations: Abdomen is soft.     Tenderness: There is no abdominal tenderness.  Musculoskeletal:        General: Normal range of motion.     Cervical back: Normal range of motion.     Right lower leg: No edema.     Left lower leg: No edema.  Skin:    General: Skin is warm and dry.  Neurological:     Mental Status: He is alert and oriented to person, place, and time.     Comments: LUE/LLE 1/5 strength, unable to lift off bed 4/5 strength in  RUE/RLE Reports unable to feel in LUE/LLE  Psychiatric:        Behavior: Behavior normal.     ED Results / Procedures / Treatments   Labs (all labs ordered are listed, but only abnormal results are displayed) Labs Reviewed  COMPREHENSIVE METABOLIC PANEL - Abnormal; Notable for the following components:      Result Value   Glucose, Bld 114 (*)    All other components within normal limits  URINALYSIS, ROUTINE W REFLEX MICROSCOPIC - Abnormal; Notable for the following components:   Specific Gravity, Urine 1.041 (*)    Leukocytes,Ua TRACE (*)    Bacteria, UA RARE (*)    All other components within normal limits  I-STAT CHEM 8, ED - Abnormal; Notable for the following components:   Potassium 3.4 (*)    Glucose, Bld 112 (*)    Calcium, Ion 1.09 (*)    All other  components within normal limits  SARS CORONAVIRUS 2 BY RT PCR  ETHANOL  PROTIME-INR  APTT  CBC  DIFFERENTIAL  RAPID URINE DRUG SCREEN, HOSP PERFORMED  CBG MONITORING, ED    EKG EKG Interpretation Date/Time:  Sunday January 04 2023 09:08:40 EDT Ventricular Rate:  96 PR Interval:  145 QRS Duration:  96 QT Interval:  353 QTC Calculation: 447 R Axis:   55  Text Interpretation: Sinus rhythm Borderline T abnormalities, inferior leads No significant change since last tracing Confirmed by Elayne Snare ([id number]) on 01/04/2023 9:40:03 AM  Radiology MR BRAIN WO CONTRAST  Result Date: 01/04/2023 CLINICAL DATA:  Stroke follow-up EXAM: MRI HEAD WITHOUT CONTRAST TECHNIQUE: Multiplanar, multiecho pulse sequences of the brain and surrounding structures were obtained without intravenous contrast. COMPARISON:  Head CT and CTA from earlier today FINDINGS: Brain: No acute infarction, hemorrhage, hydrocephalus, extra-axial collection or mass lesion. No white matter disease or atrophy. Vascular: Normal flow voids. Skull and upper cervical spine: Normal marrow signal. Sinuses/Orbits: Negative. IMPRESSION: Normal brain MRI. Electronically Signed   By: Tiburcio Pea M.D.   On: 01/04/2023 09:20   CT ANGIO HEAD NECK W WO CM (CODE STROKE)  Result Date: 01/04/2023 CLINICAL DATA:  Stroke workup. EXAM: CT ANGIOGRAPHY HEAD AND NECK WITH AND WITHOUT CONTRAST TECHNIQUE: Multidetector CT imaging of the head and neck was performed using the standard protocol during bolus administration of intravenous contrast. Multiplanar CT image reconstructions and MIPs were obtained to evaluate the vascular anatomy. Carotid stenosis measurements (when applicable) are obtained utilizing NASCET criteria, using the distal internal carotid diameter as the denominator. RADIATION DOSE REDUCTION: This exam was performed according to the departmental dose-optimization program which includes automated exposure control, adjustment of the mA  and/or kV according to patient size and/or use of iterative reconstruction technique. CONTRAST:  75mL OMNIPAQUE IOHEXOL 350 MG/ML SOLN COMPARISON:  Head CT from earlier today FINDINGS: CTA NECK FINDINGS Aortic arch: Normal Right carotid system: Vessels are smoothly contoured and widely patent without atheromatous change. Left carotid system: Vessels are smoothly contoured and widely patent without atheromatous change. Vertebral arteries: Vessels are smoothly contoured and widely patent without atheromatous change. Skeleton: Unremarkable Other neck: Unremarkable Upper chest: Clear apical lungs Review of the MIP images confirms the above findings CTA HEAD FINDINGS Anterior circulation: No significant stenosis, proximal occlusion, aneurysm, or vascular malformation. Posterior circulation: No significant stenosis, proximal occlusion, aneurysm, or vascular malformation. Venous sinuses: As permitted by contrast timing, patent. Anatomic variants: Fetal type right PCA. Review of the MIP images confirms the above findings IMPRESSION: Negative CTA of the head and neck.  Electronically Signed   By: Tiburcio Pea M.D.   On: 01/04/2023 08:38   CT HEAD CODE STROKE WO CONTRAST  Result Date: 01/04/2023 CLINICAL DATA:  Code stroke. Neuro deficit with acute stroke suspected EXAM: CT HEAD WITHOUT CONTRAST TECHNIQUE: Contiguous axial images were obtained from the base of the skull through the vertex without intravenous contrast. RADIATION DOSE REDUCTION: This exam was performed according to the departmental dose-optimization program which includes automated exposure control, adjustment of the mA and/or kV according to patient size and/or use of iterative reconstruction technique. COMPARISON:  Brain MRI 10/14/2019 FINDINGS: Brain: No evidence of acute infarction, hemorrhage, hydrocephalus, extra-axial collection or mass lesion/mass effect. Vascular: No hyperdense vessel or unexpected calcification. Skull: Normal. Negative for  fracture or focal lesion. Sinuses/Orbits: No acute finding. Other: Prelim sent in epic chat ASPECTS [facility] Stroke Program Early CT Score) - Ganglionic level infarction (caudate, lentiform nuclei, internal capsule, insula, M1-M3 cortex): 7 - Supraganglionic infarction (M4-M6 cortex): 3 Total score (0-10 with 10 being normal): 10 IMPRESSION: Negative head CT Electronically Signed   By: Tiburcio Pea M.D.   On: 01/04/2023 08:27    Procedures Procedures    Medications Ordered in ED Medications  iohexol (OMNIPAQUE) 350 MG/ML injection 75 mL (75 mLs Intravenous Contrast Given 01/04/23 0831)  metoCLOPramide (REGLAN) injection 10 mg (10 mg Intravenous Given 01/04/23 0954)  acetaminophen (TYLENOL) tablet 1,000 mg (1,000 mg Oral Given 01/04/23 0954)  magnesium sulfate IVPB 1 g 100 mL (0 g Intravenous Stopped 01/04/23 1054)    ED Course/ Medical Decision Making/ A&P Clinical Course as of 01/04/23 1301  Sun Jan 04, 2023  3086 Patient on Eliquis and is not TNK candidate. Recommended MRI by neurology. [VK]  1020 Rare bacteria and trace WBC making UTI unlikely. Labs otherwise within normal range. [VK]  1044 Family reports he was also complaining of sore throat. No abnormality in posterior oropharynx, will have COVID swab. [VK]  1126 Suspect symptoms from external neurologic disorder, however is unable to ambulate the will require admission for PT eval and psych consult. [VK]  1301 Patient signed out to family medicine team. [VK]    Clinical Course User Index [VK] Rexford Maus, DO                                 Medical Decision Making This patient presents to the ED with chief complaint(s) of L-sided weakness with pertinent past medical history of a fib on Eliquis, PTSD which further complicates the presenting complaint. The complaint involves an extensive differential diagnosis and also carries with it a high risk of complications and morbidity.    The differential diagnosis includes  CVA, TIA, complex migraine, functional neurologic disorder, hypo or hyperglycemia, electrolyte derangement, infection  Additional history obtained: Additional history obtained from spouse and EMS  Records reviewed previous admission documents and Care Everywhere/External Records  ED Course and Reassessment: Patient was made a prehospital arrival stroke alert and was evaluated by neurology and myself on his arrival.  Primary survey was intact and he was noted to have left-sided deficits.  Accu-Chek on arrival was normal.  Blood pressure was normal.  Patient was immediately transferred to CT scanner.  The patient has had multiple similar presentations have multiple different hospitals throughout the years and suspect this is likely functional neurologic disorder although will need evaluation to rule out CVA.  Also considering complex migraine as patient does report a headache this morning.  Patient is not a TNK candidate due to being on Eliquis.  Independent labs interpretation:  The following labs were independently interpreted: within normal range  Independent visualization of imaging: - I independently visualized the following imaging with scope of interpretation limited to determining acute life threatening conditions related to emergency care: CTH/CTA, MRI brain, which revealed no acute abnormality  Consultation: - Consulted or discussed management/test interpretation w/ external professional: neurology  Consideration for admission or further workup: Patient requires admission due to his left-sided deficits and inability to walk Social Determinants of health: N/A    Risk OTC drugs. Prescription drug management. Decision regarding hospitalization.          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

## 2023-01-05 ENCOUNTER — Encounter (HOSPITAL_COMMUNITY): Payer: Self-pay | Admitting: Family Medicine

## 2023-01-05 DIAGNOSIS — F444 Conversion disorder with motor symptom or deficit: Secondary | ICD-10-CM | POA: Diagnosis not present

## 2023-01-05 LAB — BASIC METABOLIC PANEL
Anion gap: 10 (ref 5–15)
BUN: 11 mg/dL (ref 6–20)
CO2: 24 mmol/L (ref 22–32)
Calcium: 9.2 mg/dL (ref 8.9–10.3)
Chloride: 103 mmol/L (ref 98–111)
Creatinine, Ser: 0.72 mg/dL (ref 0.61–1.24)
GFR, Estimated: >60 mL/min (ref >60)
Glucose, Bld: 131 mg/dL — ABNORMAL HIGH (ref 70–99)
Potassium: 3.6 mmol/L (ref 3.5–5.1)
Sodium: 137 mmol/L (ref 135–145)

## 2023-01-05 MED ORDER — GABAPENTIN 100 MG PO CAPS
100.0000 mg | ORAL_CAPSULE | Freq: Three times a day (TID) | ORAL | Status: DC
  Administered 2023-01-05 – 2023-01-06 (×4): 100 mg via ORAL
  Filled 2023-01-05 (×4): qty 1

## 2023-01-05 MED ORDER — SERTRALINE HCL 50 MG PO TABS
50.0000 mg | ORAL_TABLET | Freq: Every day | ORAL | Status: DC
  Administered 2023-01-05 – 2023-01-07 (×3): 50 mg via ORAL
  Filled 2023-01-05 (×3): qty 1

## 2023-01-05 NOTE — Evaluation (Signed)
Physical Therapy Evaluation Patient Details Name: Matthew Armstrong. MRN: [id number] DOB: [birth year] Today's Date: 01/05/2023  History of Present Illness  42 y.o. male presents 10/20 from home via EMS for acute onset of left side weakness and aphasia. 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 PMHx:  A fib on Eliquis, HTN, anxiety, GERD, spinal cord injury with neurogenic bladder, PTSD, prior CVA, functional neurological symptoms disorder with weakness.  Clinical Impression  Pt admitted with above diagnosis. Ind PTA however reports multiple falls in prison due to spontaneous Lt sided weakness. Required mod assist to stand, and min assist to pivot with RW. Inconsistent functional strength on Lt, improves with distraction, minimal to trace movement in isolation when consciously aware. Does not have adequate assistance at home to physically transfer and navigate steps into his mobile home - may benefit from short term SNF. Will progress function as rapidly as tolerated while acutely admitted.  Pt currently with functional limitations due to the deficits listed below (see PT Problem List). Pt will benefit from acute skilled PT to increase their independence and safety with mobility to allow discharge.           If plan is discharge home, recommend the following: A lot of help with walking and/or transfers;A lot of help with bathing/dressing/bathroom;Assistance with cooking/housework;Assist for transportation;Help with stairs or ramp for entrance   Can travel by private vehicle   Yes    Equipment Recommendations  (TBD - if going home would need RW and 3-in-1 (wants a w/c however standard w/c unlikely to fit in mobile home and has stairs he will not be able to get in and out with.))  Recommendations for Other Services       Functional Status Assessment Patient has had a recent decline in their functional status and demonstrates the ability to make significant improvements  in function in a reasonable and predictable amount of time.     Precautions / Restrictions Precautions Precautions: Fall Restrictions Weight Bearing Restrictions: No      Mobility  Bed Mobility Overal bed mobility: Needs Assistance Bed Mobility: Supine to Sit     Supine to sit: Supervision     General bed mobility comments: Extra time, uses RUE to assist LLE out of bed.    Transfers Overall transfer level: Needs assistance Equipment used: Rolling walker (2 wheels) Transfers: Sit to/from Stand, Bed to chair/wheelchair/BSC Sit to Stand: Mod assist Stand pivot transfers: Min assist         General transfer comment: Mod assist for boost to stand, cues for technique, weight shift towards stronger side to compensate. Able to grasp RW with LUE appropriately and bear weight with bil UEs to pivot and take one small hop backwards with RLE. Assisted with LLE placement and cues for pivot technique.    Ambulation/Gait               General Gait Details: Deferred due to inconsistent and upredictable use of LUE and LLE, increasing fall risk.  Stairs            Wheelchair Mobility     Tilt Bed    Modified Rankin (Stroke Patients Only)       Balance                                             Pertinent Vitals/Pain Pain Assessment Pain  Assessment: 0-10 Pain Score: 7  Pain Location: posterior head Pain Descriptors / Indicators: Aching Pain Intervention(s): Monitored during session, Repositioned    Home Living Family/patient expects to be discharged to:: Private residence Living Arrangements: Spouse/significant other Available Help at Discharge: Family Type of Home: Mobile home Home Access: Stairs to enter Entrance Stairs-Rails: Lawyer of Steps: 5   Home Layout: One level Home Equipment: None      Prior Function Prior Level of Function : Independent/Modified Independent;History of Falls (last six  months)             Mobility Comments: reports ind, with multiple falls from Lt side weakness ADLs Comments: ind     Extremity/Trunk Assessment   Upper Extremity Assessment Upper Extremity Assessment: Defer to OT evaluation    Lower Extremity Assessment Lower Extremity Assessment: Difficult to assess due to impaired cognition;LLE deficits/detail LLE Deficits / Details: Trace to no movement when tested in isolation. Functionally able to bear weight and mobilized majority of muscle groups against gravity with distraction. LLE Sensation:  (Reports paresthesias) LLE Coordination: decreased gross motor       Communication   Communication Communication: No apparent difficulties  Cognition Arousal: Alert Behavior During Therapy: WFL for tasks assessed/performed Overall Cognitive Status: Within Functional Limits for tasks assessed                                          General Comments General comments (skin integrity, edema, etc.): Educated on safety, awareness of symptoms.    Exercises General Exercises - Lower Extremity Ankle Circles/Pumps: AAROM, Left, 15 reps, Seated Quad Sets: Strengthening, Both, 10 reps, Seated Gluteal Sets: Strengthening, Both, 5 reps, Seated Long Arc Quad: AAROM, Left, 10 reps, Seated   Assessment/Plan    PT Assessment Patient needs continued PT services  PT Problem List Decreased strength;Decreased activity tolerance;Decreased balance;Decreased mobility;Decreased coordination;Decreased knowledge of use of DME;Impaired sensation;Pain       PT Treatment Interventions DME instruction;Gait training;Stair training;Functional mobility training;Therapeutic activities;Therapeutic exercise;Balance training;Neuromuscular re-education;Patient/family education;Wheelchair mobility training    PT Goals (Current goals can be found in the Care Plan section)  Acute Rehab PT Goals Patient Stated Goal: get well PT Goal Formulation: With  patient Time For Goal Achievement: 01/19/23 Potential to Achieve Goals: Fair    Frequency Min 1X/week     Co-evaluation               AM-PAC PT "6 Clicks" Mobility  Outcome Measure Help needed turning from your back to your side while in a flat bed without using bedrails?: None Help needed moving from lying on your back to sitting on the side of a flat bed without using bedrails?: A Little Help needed moving to and from a bed to a chair (including a wheelchair)?: A Little Help needed standing up from a chair using your arms (e.g., wheelchair or bedside chair)?: A Lot Help needed to walk in hospital room?: A Lot Help needed climbing 3-5 steps with a railing? : Total 6 Click Score: 15    End of Session Equipment Utilized During Treatment: Gait belt Activity Tolerance: Patient tolerated treatment well Patient left: in chair;with chair alarm set;with call bell/phone within reach Nurse Communication: Mobility status PT Visit Diagnosis: Unsteadiness on feet (R26.81);Hemiplegia and hemiparesis;Difficulty in walking, not elsewhere classified (R26.2);Other symptoms and signs involving the nervous system (R29.898) Hemiplegia - Right/Left: Left Hemiplegia - caused  by: Unspecified    Time: 4098-1191 PT Time Calculation (min) (ACUTE ONLY): 17 min   Charges:   PT Evaluation $PT Eval Low Complexity: 1 Low   PT General Charges $$ ACUTE PT VISIT: 1 Visit         Kathlyn Sacramento, PT, DPT [facility]  Rehabilitation Services Physical Therapist Office: [phone] Website: [email or web address]   Berton Mount 01/05/2023, 12:13 PM
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
## 2023-01-05 NOTE — Progress Notes (Addendum)
Spoke with Psychiatry and Neurology colleagues. At this time we will not pursue further neurologic work-up given extensive evaluation by multiple neurologists at multiple facilities and current evaluation this admission. Agree with neurology's neurologic assessment. Highly suspect functional neurologic disorder. Neurology will not follow at this time. Start sertraline per psych recs. Psych will continue to follow.  We greatly appreciate Psychiatry and Neurology's assistance in this patient's care.

## 2023-01-05 NOTE — Assessment & Plan Note (Addendum)
K on admission is 3.4. Today it is 3.6. Resolved -oral potassium repletion -A.m. BMP

## 2023-01-05 NOTE — Progress Notes (Signed)
Speech Language Pathology Treatment: Dysphagia  Patient Details Name: Matthew Armstrong. MRN: [id number] DOB: 27-Apr-1980 Today's Date: 01/05/2023 Time: 1213-1223 SLP Time Calculation (min) (ACUTE ONLY): 10 min  Assessment / Plan / Recommendation Clinical Impression  Pt is doing well with eating and swallowing with no further choking episodes nor signs of dysphagia. Continues with left mild facial asymmetry and inconsistent tongue extension. Speech is intelligible with fluctuating precision of production. There are no further swallowing issues requiring SLP f/u. Continue regular solids/thin liquids; D/W Dr. Pollie Meyer. Our service will sign off.   HPI HPI: Matthew Armstrong. is a 42 y.o. male who presented from home via EMS for acute onset of left side weakness and aphasia.  MRI normal, code stroke canceled. Pt with past medical history of A fib on Eliquis, HTN, anxiety, GERD, spinal cord injury with neurogenic bladder, PTSD, prior CVA, functional neurological symptoms disorder with weakness, chronic pain      SLP Plan  All goals met      Recommendations for follow up therapy are one component of a multi-disciplinary discharge planning process, led by the attending physician.  Recommendations may be updated based on patient status, additional functional criteria and insurance authorization.    Recommendations  Diet recommendations: Regular;Thin liquid Liquids provided via: Cup;Straw Medication Administration: Whole meds with liquid Supervision: Patient able to self feed                  Oral care BID     Dysphagia, unspecified (R13.10)     All goals met    Nekoda Chock L. Samson Frederic, MA CCC/SLP Clinical Specialist - Acute Care SLP Acute Rehabilitation Services Office number [phone]  Matthew Armstrong  01/05/2023, 12:30 PM
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
## 2023-01-05 NOTE — Plan of Care (Signed)

## 2023-01-05 NOTE — Hospital Course (Addendum)
Matthew Armstrong. is a 42 y.o. male who was admitted to the [facility] Medicine Teaching Service at [facility] for left sided weakness/paralysis. Hospital course is outlined below by problem.   Left sided weakness Patient initially presented to the emergency department under code stroke and was evaluated upon arrival by neurology.  CT and MRI showed no evidence of acute cranial process, bleeding or mass.  Neurology signed off at that point and recommended psychiatry consult for functional neurologic disorder.  Psychiatry was consulted and recommended starting zoloft, and to re-consult neurology. Neurology  recommended that any further studies be done outpatient. PT/OT eval initiated early in course of care. He will have outpatient PT/OT. SPEP studies to rule out hyperviscosity syndromes were drawn but not resulted prior to discharge.  Hypokalemia Patient had K of 3.4 on admission, given successful repletion.  Other conditions that were chronic and stable: Atrial fibrillation, stroke prophylaxis, hypertension  Issues for follow up: Outpatient neurology referral for recurrent L sided weakness, consider nerve conduction studies Outpatient PT/OT

## 2023-01-05 NOTE — Plan of Care (Signed)
  Problem: Education: Goal: Knowledge of General Education information will improve Description: Including pain rating scale, medication(s)/side effects and non-pharmacologic comfort measures Outcome: Progressing   Problem: Health Behavior/Discharge Planning: Goal: Ability to manage health-related needs will improve Outcome: Progressing   Problem: Clinical Measurements: Goal: Will remain free from infection Outcome: Progressing Goal: Respiratory complications will improve Outcome: Progressing Goal: Cardiovascular complication will be avoided Outcome: Progressing   Problem: Activity: Goal: Risk for activity intolerance will decrease Outcome: Progressing   Problem: Nutrition: Goal: Adequate nutrition will be maintained Outcome: Progressing   

## 2023-01-05 NOTE — Assessment & Plan Note (Addendum)
Patient presentation most consistent with this diagnosis, given physical exam findings of waxing and waning deficits when distracted as well as history of PTSD, recent change in medications, and recent stressors.  Would benefit from admission for psychiatric evaluation as well as PT. -Psychiatry consulted, awaiting recommendations -PT/OT eval -SPEP, ESR pending Psych eval

## 2023-01-06 DIAGNOSIS — E876 Hypokalemia: Secondary | ICD-10-CM | POA: Diagnosis not present

## 2023-01-06 DIAGNOSIS — F444 Conversion disorder with motor symptom or deficit: Secondary | ICD-10-CM | POA: Diagnosis not present

## 2023-01-06 DIAGNOSIS — Z609 Problem related to social environment, unspecified: Secondary | ICD-10-CM | POA: Diagnosis not present

## 2023-01-06 DIAGNOSIS — F329 Major depressive disorder, single episode, unspecified: Secondary | ICD-10-CM | POA: Diagnosis not present

## 2023-01-06 MED ORDER — GABAPENTIN 300 MG PO CAPS
300.0000 mg | ORAL_CAPSULE | Freq: Three times a day (TID) | ORAL | Status: DC
  Administered 2023-01-06 – 2023-01-07 (×3): 300 mg via ORAL
  Filled 2023-01-06 (×3): qty 1

## 2023-01-06 MED ORDER — POTASSIUM CHLORIDE CRYS ER 20 MEQ PO TBCR
40.0000 meq | EXTENDED_RELEASE_TABLET | Freq: Once | ORAL | Status: AC
  Administered 2023-01-06: 40 meq via ORAL
  Filled 2023-01-06: qty 2

## 2023-01-06 NOTE — TOC Initial Note (Signed)
Transition of Care (TOC) - Initial/Assessment Note    Patient Details  Name: Matthew Armstrong. MRN: [id number] Date of Birth: 09-27-1980  Transition of Care [facility]) CM/SW Contact:    Baldemar Lenis, LCSW Phone Number: 01/06/2023, 2:57 PM  Clinical Narrative:      CSW met with patient to discuss recommendation for SNF placement. Patient in agreement. CSW discussed with patient that his legal involvement may be a barrier to placement, and patient indicated understanding. CSW completed referral and faxed out, will follow for bed offers.             Expected Discharge Plan: Skilled Nursing Facility Barriers to Discharge: Continued Medical Work up, English as a second language teacher, Other (must enter comment) (legal involvement)   Patient Goals and CMS Choice Patient states their goals for this hospitalization and ongoing recovery are:: to get better CMS Medicare.gov Compare Post Acute Care list provided to:: Patient Choice offered to / list presented to : Patient [place] ownership interest in [facility].provided to:: Patient    Expected Discharge Plan and Services     Post Acute Care Choice: Skilled Nursing Facility Living arrangements for the past 2 months: Single Family Home                                      Prior Living Arrangements/Services Living arrangements for the past 2 months: Single Family Home Lives with:: Significant Other, Minor Children Patient language and need for interpreter reviewed:: No Do you feel safe going back to the place where you live?: Yes      Need for Family Participation in Patient Care: No (Comment) Care giver support system in place?: No (comment)   Criminal Activity/Legal Involvement Pertinent to Current Situation/Hospitalization: No - Comment as needed  Activities of Daily Living   ADL Screening (condition at time of admission) Independently performs ADLs?: No Does the patient have a NEW difficulty with  bathing/dressing/toileting/self-feeding that is expected to last >3 days?: Yes (Initiates electronic notice to provider for possible OT consult) Does the patient have a NEW difficulty with getting in/out of bed, walking, or climbing stairs that is expected to last >3 days?: Yes (Initiates electronic notice to provider for possible PT consult) Does the patient have a NEW difficulty with communication that is expected to last >3 days?: No Is the patient deaf or have difficulty hearing?: No Does the patient have difficulty seeing, even when wearing glasses/contacts?: No Does the patient have difficulty concentrating, remembering, or making decisions?: No  Permission Sought/Granted Permission sought to share information with : Oceanographer granted to share information with : Yes, Verbal Permission Granted     Permission granted to share info w AGENCY: SNF        Emotional Assessment Appearance:: Appears stated age Attitude/Demeanor/Rapport: Engaged Affect (typically observed): Appropriate Orientation: : Oriented to Self, Oriented to Place, Oriented to  Time, Oriented to Situation Alcohol / Substance Use: Not Applicable Psych Involvement: Yes (comment)  Admission diagnosis:  Hypokalemia [E87.6] 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 [place] [address] Phone: [phone] Fax: [phone]     Social Determinants of Health (SDOH)  Social History: SDOH Screenings   Food Insecurity: No Food Insecurity (01/04/2023)  Housing: Low Risk  (01/04/2023)  Transportation Needs: No Transportation Needs (01/04/2023)  Utilities: Not At Risk (01/04/2023)  Financial Resource  Strain: Low Risk  (09/14/2022)   Received from [facility]  Social Connections: Unknown (09/05/2022)   Received from [facility]  Tobacco Use: High Risk (01/04/2023)   SDOH Interventions:     Readmission Risk Interventions     No data to display

## 2023-01-06 NOTE — Assessment & Plan Note (Signed)
Patient was recently released from prison and does not have a PCP.  This will be essential to continuing his recovery in the outpatient setting. -Consult to [facility]

## 2023-01-06 NOTE — Assessment & Plan Note (Addendum)
Psychiatry did not feel his presentation is fully consistent with functional neurologic symptom disorder and recommended neurology consult, however neurology felt that all inpatient neurological problems had been ruled out. They recommend no further testing at this time. Had lengthy discussion with patient today about the likely cause of his symptoms.  He is agreeable to what ever testing we feel is necessary. -Psychiatry following, appreciate recommendations as below:  -Start sertraline 50 mg -PT/OT treatment -SPEP  -Gabapentin 100 mg 3 times daily for headache
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
## 2023-01-06 NOTE — NC FL2 (Signed)
  [place] MEDICAID FL2 LEVEL OF CARE FORM     IDENTIFICATION  Patient Name: Matthew Armstrong. Birthdate: [birth year] Sex: male Admission Date (Current Location): 01/04/2023  [facility] and [place] Number:  Producer, television/film/video and Address:  [facility], 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 01/04/2023    Orientation RESPIRATION BLADDER Height & Weight     Self, Time, Situation, Place  Normal Continent Weight: 169 lb 12.1 oz (77 kg) Height:  5\' 10"  (177.8 cm) (pt reports)  BEHAVIORAL SYMPTOMS/MOOD NEUROLOGICAL BOWEL NUTRITION STATUS      Continent Diet (regular)  AMBULATORY STATUS COMMUNICATION OF NEEDS Skin   Limited Assist Verbally Normal                       Personal Care Assistance Level of Assistance  Bathing, Feeding, Dressing Bathing Assistance: Limited assistance Feeding assistance: Limited assistance Dressing Assistance: Limited assistance     Functional Limitations Info  Speech     Speech Info: Impaired (dysarthria)    SPECIAL CARE FACTORS FREQUENCY  PT (By licensed PT), OT (By licensed OT)     PT Frequency: 5x/wk OT Frequency: 5x/wk            Contractures Contractures Info: Not present    Additional Factors Info  Code Status, Allergies, Psychotropic Code Status Info: Full Allergies Info: Ciprofloxacin, Clindamycin/lincomycin, Ibuprofen, Naproxen, Nsaids, Tetracyclines & Related Psychotropic Info: Zoloft 50mg   daily         Current Medications (01/06/2023):  This is the current hospital active medication list Current Facility-Administered Medications  Medication Dose Route Frequency Provider Last Rate Last Admin   acetaminophen (TYLENOL) tablet 1,000 mg  1,000 mg Oral Q6H PRN Evette Georges, MD   1,000 mg at 01/06/23 0552   apixaban (ELIQUIS) tablet 5 mg  5 mg Oral BID Gerrit Heck, DO   5 mg at 01/06/23 1025   aspirin EC tablet 81 mg  81 mg Oral Daily Evette Georges, MD   81 mg at 01/06/23 1025   atorvastatin (LIPITOR) tablet 80 mg  80 mg Oral QHS Gerrit Heck, DO   80 mg at 01/05/23 2254   gabapentin (NEURONTIN) capsule 300 mg  300 mg Oral TID Evette Georges, MD       lisinopril (ZESTRIL) tablet 10 mg  10 mg Oral Daily Gerrit Heck, DO   10 mg at 01/06/23 1025   prazosin (MINIPRESS) capsule 2 mg  2 mg Oral QHS Gerrit Heck, DO   2 mg at 01/05/23 2254   sertraline (ZOLOFT) tablet 50 mg  50 mg Oral Daily Tiffany Kocher, DO   50 mg at 01/06/23 1025     Discharge Medications: Please see discharge summary for a list of discharge medications.  Relevant Imaging Results:  Relevant Lab Results:   Additional Information SS#: [id number]  Baldemar Lenis, LCSW
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
## 2023-01-06 NOTE — Discharge Instructions (Signed)
[facility] [address], [place], [address] [phone] phone OUTPATIENT Walk-in information: Please note, all walk-ins are first come & first serve, with limited number of available spots. Therapist for therapy:  Monday (male) & Wednesdays-Thursdays (male): Please ARRIVE at 7:00 AM for registration Will START at 8:00 AM Every 1st & 2nd Friday of the month: Please ARRIVE at 7:00 AM for registration Will START at 1 PM - 5 PM (virtual) Psychiatrist for medication management: Monday - Friday:  Please ARRIVE at 7:00 AM for registration Will START at 8:00 AM   Regretfully, due to limited availability, please be aware that you may not been seen on the same day as walk-in. Please consider making an appointment or trying again. Thank you for your patience and understanding.

## 2023-01-06 NOTE — Progress Notes (Signed)
Daily Progress Note Intern Pager: [phone]  Patient name: Matthew Armstrong. Medical record number: [id number] Date of birth: 07/31/1980 Age: 42 y.o. Gender: male  Primary Care Provider: Pcp, No Consultants: Psychiatry Code Status: full  Pt Overview and Major Events to Date:  10/20: Admitted to FMTS   Assessment and Plan:  Matthew Armstrong is a 42 year old male with A-fib and multiple admissions for left sided hemiparesis presenting with sudden onset left-sided weakness.  CVA has been ruled out.  He is improving, we will continue with psych's recommendations and PT OT treatment. 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 They recommend no further testing at this time. Had lengthy discussion with patient today about the likely cause of his symptoms.  He is agreeable to what ever testing we feel is necessary. -Psychiatry following, appreciate recommendations as below:  -Start sertraline 50 mg -PT/OT treatment -SPEP  -Gabapentin 100 mg 3 times daily for headache Poor social situation Patient was recently released from prison and does not have a PCP.  This will be essential to continuing his recovery in the outpatient setting. -Consult to TOC   Chronic and Stable Problems:  A-fib: Eliquis, 20 meq of potassium today check mag Prior CVA: Aspirin, Lipitor HTN: Lisinopril PTSD: Prazosin   FEN/GI: Regular PPx: Eliquis Dispo:SNF? pending clinical improvement .   Subjective:  Patient was awake sitting up in bed this morning on evaluation.  He is in good spirits and feels that he is making progress.  We had a lengthy discussion about the recommendations of the specialists from yesterday.  He reports that he was told by  psychiatry that they felt this was more of a medical problem and recommended further workup with neurology.  I discussed with him that we had spoken with neurology and they felt that without imaging to show a brain problem they did not feel that he needed any further workup in the hospital.  We discussed at length what treatment for functional neurologic disorder looks like, including treatment of his mental health as well as working with PT and OT to remind his body that he can move.  He is agreeable to what ever treatment we is necessary, does want [place] to do what ever testing we need to do to make sure we are treating the correct thing.  Patient reports that his headache was minimally helped by low-dose gabapentin yesterday, but that Tylenol and ice pack did not help at all.  He was supposed to get a heating pad but never received it.  Objective: Temp:  [97.7 F (36.5 C)-99.2 F (37.3 C)] 98.4 F (36.9 C) (10/22 0749) Pulse Rate:  [64-92] 92 (10/22 0749) Resp:  [17-19] 17 (10/22 0749) BP: (108-131)/(65-81) 131/76 (10/22 0749) SpO2:  [97 %-99 %] 97 % (10/22 0749) Weight:  [77 kg] 77 kg (10/21 0953) Physical Exam: General: Alert, oriented.  No obvious distress Cardiovascular: RRR, no M/R/G Respiratory: CTAB, no increased work of breathing Abdomen: Flat, soft, nontender. Extremities: Remains consciously weak in left upper extremity, but with distraction, strength increases to 4 out of 5.  Left lower extremity without conscious movement other than minimal wiggling of the toes.  With distraction dorsiflexion is 5 out of 5. Neurology: Cranial nerves II through XII intact today.  See above for discussion  of extremity weakness.  Patient reports intact sensation in her bilateral upper extremity, new paresthesias in the left lower extremity.  Laboratory: Most recent CBC Lab Results  Component Value Date   WBC 8.8 01/04/2023   HGB 14.3 01/04/2023   HCT 42.0 01/04/2023   MCV 89.6 01/04/2023   PLT  273 01/04/2023   Most recent BMP    Latest Ref Rng & Units 01/05/2023    7:54 AM  BMP  Glucose 70 - 99 mg/dL 161   BUN 6 - 20 mg/dL 11   Creatinine 0.96 - 1.24 mg/dL 0.45   Sodium 409 - 811 mmol/L 137   Potassium 3.5 - 5.1 mmol/L 3.6   Chloride 98 - 111 mmol/L 103   CO2 22 - 32 mmol/L 24   Calcium 8.9 - 10.3 mg/dL 9.2     Matthew Heck, DO 01/06/2023, 9:07 AM  PGY-1, [facility] FPTS Intern pager: [phone], text pages welcome Secure chat group [facility] Teaching Service

## 2023-01-07 ENCOUNTER — Other Ambulatory Visit (HOSPITAL_COMMUNITY): Payer: Self-pay

## 2023-01-07 DIAGNOSIS — F444 Conversion disorder with motor symptom or deficit: Secondary | ICD-10-CM | POA: Diagnosis not present

## 2023-01-07 DIAGNOSIS — F329 Major depressive disorder, single episode, unspecified: Secondary | ICD-10-CM

## 2023-01-07 LAB — MAGNESIUM: Magnesium: 2.2 mg/dL (ref 1.7–2.4)

## 2023-01-07 MED ORDER — GABAPENTIN 300 MG PO CAPS
300.0000 mg | ORAL_CAPSULE | Freq: Three times a day (TID) | ORAL | 3 refills | Status: DC
  Filled 2023-01-07 (×2): qty 90, 30d supply, fill #0

## 2023-01-07 MED ORDER — SERTRALINE HCL 50 MG PO TABS
50.0000 mg | ORAL_TABLET | Freq: Every day | ORAL | 3 refills | Status: DC
  Filled 2023-01-07 (×2): qty 30, 30d supply, fill #0

## 2023-01-07 NOTE — Assessment & Plan Note (Deleted)
Patient was recently released from prison and does not have a PCP.  This will be essential to continuing his recovery in the outpatient setting. -Consult to [facility]

## 2023-01-07 NOTE — Plan of Care (Signed)

## 2023-01-07 NOTE — Progress Notes (Signed)
Physical Therapy Treatment Patient Details Name: Matthew Armstrong. MRN: [id number] DOB: 12-13-1980 Today's Date: 01/07/2023   History of Present Illness 42 y.o. male presents 10/20 from home via EMS for acute onset of left side weakness and aphasia. 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 PMHx:  A fib on Eliquis, HTN, anxiety, GERD, spinal cord injury with neurogenic bladder, PTSD, prior CVA, functional neurological symptoms disorder with weakness.    PT Comments  Completed transfer training, short distance gait, and stair navigation techniques. Pt feels confident he can safely perform at home and will have family assist if needed. Mobilized at [facility] level with RW today. Requests rollator so he can more easily get to and from future appointments. Pt eager to d/c home to see children. Agreeable to OPPT follow-up. Patient will continue to benefit from skilled physical therapy services to further improve independence with functional mobility.     If plan is discharge home, recommend the following: Assistance with cooking/housework;Assist for transportation;Help with stairs or ramp for entrance;A little help with walking and/or transfers;A little help with bathing/dressing/bathroom   Can travel by private vehicle     Yes  Equipment Recommendations  Other (comment) (Rollator; alternatively RW and w/c for transporting to appointments)    Recommendations for Other Services       Precautions / Restrictions Precautions Precautions: Fall Restrictions Weight Bearing Restrictions: No     Mobility  Bed Mobility Overal bed mobility: Modified Independent             General bed mobility comments: Mod I extra time, lifts LLE with UEs.    Transfers Overall transfer level: Needs assistance Equipment used: Rolling walker (2 wheels) Transfers: Sit to/from Stand, Bed to chair/wheelchair/BSC Sit to Stand: Contact guard assist Stand pivot transfers: Contact guard  assist         General transfer comment: CGA for safety with transition to stand from bed and recliner. VC for technique, and sequencing with step pivot transfers. Difficulty advancing LLE. Good UE strength to stabilize with RW.    Ambulation/Gait Ambulation/Gait assistance: Contact guard assist Gait Distance (Feet): 5 Feet (x2) Assistive device: Rolling walker (2 wheels) Gait Pattern/deviations: Step-to pattern, Decreased stride length, Decreased dorsiflexion - left Gait velocity: decr Gait velocity interpretation: <1.31 ft/sec, indicative of household ambulator   General Gait Details: Able to tolerate WB through bil LEs without buckling while using RW for support. Cues for Lt heel strike which is challenging for pt but demonstrates some carryover and ability with effort. Declines further distansce due to fatigue. CGA for safety. Good UE support on RW.   Stairs Stairs: Yes Stairs assistance: Contact guard assist Stair Management: One rail Left, Step to pattern, Sideways Number of Stairs: 2 General stair comments: Educated and demonstrated technique for navigating steps similar to described home set-up. CGA for safety, teachback for recall. Able to lead up with Rt, and lock Lt knee while stepping down with bil UE support on rail. Denies further practice, states he feels confident he can safely complete this task at home and will have family to help if needed.   Wheelchair Mobility     Tilt Bed    Modified Rankin (Stroke Patients Only)       Balance Overall balance assessment: Needs assistance Sitting-balance support: Feet supported Sitting balance-Leahy Scale: Good     Standing balance support: During functional activity, Reliant on assistive device for balance, Single extremity supported Standing balance-Leahy Scale: Poor  Cognition Arousal: Alert Behavior During Therapy: WFL for tasks assessed/performed Overall Cognitive  Status: Within Functional Limits for tasks assessed                                          Exercises      General Comments General comments (skin integrity, edema, etc.): Educated on safety with mobility and awareness of deficits.      Pertinent Vitals/Pain Pain Assessment Pain Assessment: Faces Faces Pain Scale: Hurts little more Pain Location: posterior head Pain Descriptors / Indicators: Aching Pain Intervention(s): Monitored during session    Home Living                          Prior Function            PT Goals (current goals can now be found in the care plan section) Acute Rehab PT Goals Patient Stated Goal: get well PT Goal Formulation: With patient Time For Goal Achievement: 01/19/23 Potential to Achieve Goals: Fair Progress towards PT goals: Progressing toward goals    Frequency    Min 1X/week      PT Plan      Co-evaluation              AM-PAC PT "6 Clicks" Mobility   Outcome Measure  Help needed turning from your back to your side while in a flat bed without using bedrails?: None Help needed moving from lying on your back to sitting on the side of a flat bed without using bedrails?: None Help needed moving to and from a bed to a chair (including a wheelchair)?: A Little Help needed standing up from a chair using your arms (e.g., wheelchair or bedside chair)?: A Little Help needed to walk in hospital room?: A Little Help needed climbing 3-5 steps with a railing? : A Little 6 Click Score: 20    End of Session Equipment Utilized During Treatment: Gait belt Activity Tolerance: Patient tolerated treatment well Patient left: in chair;with chair alarm set;with call bell/phone within reach   PT Visit Diagnosis: Unsteadiness on feet (R26.81);Hemiplegia and hemiparesis;Difficulty in walking, not elsewhere classified (R26.2);Other symptoms and signs involving the nervous system (R29.898) Hemiplegia - Right/Left:  Left Hemiplegia - caused by: Unspecified     Time: 3557-3220 PT Time Calculation (min) (ACUTE ONLY): 18 min  Charges:    $Gait Training: 8-22 mins PT General Charges $$ ACUTE PT VISIT: 1 Visit                     Kathlyn Sacramento, PT, DPT [facility]  Rehabilitation Services Physical Therapist Office: [phone] Website: [email or web address]    Berton Mount 01/07/2023, 10:41 AM

## 2023-01-07 NOTE — TOC Progression Note (Signed)
Transition of Care [facility]) - Progression Note    Patient Details  Name: Matthew Armstrong. MRN: [id number] Date of Birth: 20-Aug-1980  Transition of Care [facility]) CM/SW Contact  Baldemar Lenis, [place] Phone Number: 01/07/2023, 10:13 AM  Clinical Narrative:   Patient has no bed offers at this time. CSW to follow.    Expected Discharge Plan: Skilled Nursing Facility Barriers to Discharge: Continued Medical Work up, English as a second language teacher, Other (must enter comment) (legal involvement)  Expected Discharge Plan and Services     Post Acute Care Choice: Skilled Nursing Facility Living arrangements for the past 2 months: Single Family Home                                       Social Determinants of Health (SDOH) Interventions SDOH Screenings   Food Insecurity: No Food Insecurity (01/04/2023)  Housing: Low Risk  (01/04/2023)  Transportation Needs: No Transportation Needs (01/04/2023)  Utilities: Not At Risk (01/04/2023)  Financial Resource Strain: Low Risk  (09/14/2022)   Received from [facility]  Social Connections: Unknown (09/05/2022)   Received from [facility]  Tobacco Use: High Risk (01/04/2023)    Readmission Risk Interventions     No data to display

## 2023-01-07 NOTE — Assessment & Plan Note (Deleted)
Psychiatry did not feel his presentation is fully consistent with functional neurologic symptom disorder and recommended neurology consult, however neurology felt that all inpatient neurological problems had been ruled out. They recommend no further testing at this time. Had lengthy discussion with patient today about the likely cause of his symptoms.  He is agreeable to what ever testing we feel is necessary. -Psychiatry following, appreciate recommendations as below:  -Start sertraline 50 mg -PT/OT treatment -SPEP  -Gabapentin 100 mg 3 times daily for headache

## 2023-01-07 NOTE — Discharge Summary (Addendum)
Family Medicine Teaching [facility] Discharge Summary  Patient name: Matthew Armstrong. Medical record number: [id number] Date of birth: [birth year] Age: 42 y.o. 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 is a 42 y.o. male who was admitted to the [facility] at [facility] for left sided weakness/paralysis. Hospital course is outlined below by problem.   Functional Neurological Disorder Patient initially presented to the emergency department under code stroke and was evaluated upon arrival by neurology.  CT and MRI showed no evidence of acute cranial process, bleeding or mass.  Neurology signed off at that point and recommended psychiatry consult for functional neurologic disorder.  Psychiatry was consulted and recommended starting zoloft, and to re-consult neurology. Neurology reiterated that they did not feel his presentation supported further inpatient work up, and recommended that any further studies they would do could be done outpatient. PT/OT eval initiated early in course of care. He will have outpatient PT/OT. SPEP studies to rule out hyperviscosity syndromes were drawn but not resulted prior to discharge.  Hypokalemia Patient had K of 3.4 on admission, given successful repletion.  Other conditions that were chronic and stable: Afib, stroke prophylaxis,  hypertension  Issues for follow up: Outpatient neurology referral for recurrent L sided weakness, consider nerve conduction studies Arrange initiation of PCP services  Outpatient PT/OT    Disposition: Home with outpatient PT  Discharge Condition: Stable  Discharge Exam:  Vitals:   01/07/23 0355 01/07/23 0720  BP: 104/65 116/64  Pulse: 62 77  Resp: 18 20  Temp: 98.7 F (37.1 C) 98.7 F (37.1 C)  SpO2: 98% 99%   Neuro: Alert, oriented, no obvious distress Cardiac: RRR, no M/R/G Respiratory: CTAB, no increased work of breathing Neurologic: Improving strength in left upper extremity, able to consciously wiggle the toes and weakly dorsiflex and plantarflex the left foot.  With distraction deficits improve.  Cranial nerves intact with residual tingling of the left side of the face and all distributions, left-sided facial droop improves with distraction and while the patient is talking.  Significant Labs and Imaging:  No results for input(s): "WBC", "HGB", "HCT", "PLT" in the last 48 hours. Recent Labs  Lab 01/07/23 0447  MG 2.2   Results/Tests Pending at Time of Discharge: SPEP  Discharge Medications:  Allergies as of 01/07/2023       Reactions   Ciprofloxacin    Clindamycin/lincomycin    Ibuprofen    Naproxen    Nsaids    Tetracyclines & Related         Medication List     STOP taking these medications    mirtazapine 15 MG tablet Commonly known as: REMERON       TAKE these medications    acetaminophen 325 MG tablet Commonly known as: TYLENOL Take 650 mg by mouth once as needed for moderate pain (pain score 4-6).   apixaban 5 MG Tabs tablet Commonly known as: ELIQUIS Take 5 mg by mouth 2 (two) times daily.   aspirin  EC 81 MG tablet Take 81 mg by mouth daily. Swallow whole.   atorvastatin 80 MG tablet Commonly known as: LIPITOR Take 1 tablet by mouth at bedtime.   gabapentin 300 MG capsule Commonly known as: NEURONTIN Take 1 capsule (300 mg  total) by mouth 3 (three) times daily.   lisinopril 10 MG tablet Commonly known as: ZESTRIL Take 10 mg by mouth daily.   prazosin 2 MG capsule Commonly known as: MINIPRESS Take 2 mg by mouth at bedtime.   sertraline 50 MG tablet Commonly known as: ZOLOFT Take 1 tablet (50 mg total) by mouth daily.               Durable Medical Equipment  (From admission, onward)           Start     Ordered   01/07/23 1034  For home use only DME 4 wheeled rolling walker with seat  Once       Question:  Patient needs a walker to treat with the following condition  Answer:  Weakness   01/07/23 1034            Discharge Instructions: Please refer to Patient Instructions section of EMR for full details.  Patient was counseled important signs and symptoms that should prompt return to medical care, changes in medications, dietary instructions, activity restrictions, and follow up appointments.   Follow-Up Appointments:  Follow-up Information     [facility] [place] Follow up on 02/26/2023.   Why: Your appointment is at 2:00pm. Please arrive early and bring: picture ID, current medications, insurance card Contact information: [address], [place], [place] [address]  Phone: [phone]        [facility] at [facility] Follow up.   Specialty: Rehabilitation Why: The outpatinet rehab will contact you for the first appointment Contact information: [address] [place] [address] [phone]                Gerrit Heck, DO 01/07/2023, 10:46 AM PGY-1, [facility]

## 2023-01-07 NOTE — TOC Transition Note (Signed)
Transition of Care [facility]) - CM/SW Discharge Note   Patient Details  Name: Matthew Armstrong. MRN: [id number] Date of Birth: 1980-04-15  Transition of Care [facility]) CM/SW Contact:  Kermit Balo, RN Phone Number: 01/07/2023, 1:31 PM   Clinical Narrative:     Pt did better with therapy today and can d/c home with outpatient therapy. Outpatient arranged at [facility] outpatient therapy. Information on the AVS.  Rollator ordered through [employer] and delivered to the room.  Pt has transportation home.  Final next level of care: OP Rehab Barriers to Discharge: No Barriers Identified   Patient Goals and CMS Choice CMS Medicare.gov Compare Post Acute Care list provided to:: Patient Choice offered to / list presented to : Patient  Discharge Placement                         Discharge Plan and Services Additional resources added to the After Visit Summary for       Post Acute Care Choice: Skilled Nursing Facility          DME Arranged: Dan Humphreys rolling with seat DME Agency: [facility] Date DME Agency Contacted: 01/07/23   Representative spoke with at DME Agency: Lorelle Gibbs            Social Determinants of Health (SDOH) Interventions SDOH Screenings   Food Insecurity: No Food Insecurity (01/04/2023)  Housing: Low Risk  (01/04/2023)  Transportation Needs: No Transportation Needs (01/04/2023)  Utilities: Not At Risk (01/04/2023)  Financial Resource Strain: Low Risk  (09/14/2022)   Received from [facility]  Social Connections: Unknown (09/05/2022)   Received from [facility]  Tobacco Use: High Risk (01/04/2023)     Readmission Risk Interventions     No data to display

## 2023-01-07 NOTE — Progress Notes (Signed)
Reviewed Dc paperwork with the pt. He understood. Meds are at [facility] pharmacy but pt states his ride had to leave NOW and did not want to stop by [facility] pharmacy to retrieve meds. Pts states they will return today to retrieve meds. Showed pt where [facility] pharmacy is and wheeled him to car.

## 2023-01-07 NOTE — Plan of Care (Signed)

## 2023-01-08 LAB — PROTEIN ELECTROPHORESIS, SERUM
A/G Ratio: 1.1 (ref 0.7–1.7)
Albumin ELP: 3.6 g/dL (ref 2.9–4.4)
Alpha-1-Globulin: 0.3 g/dL (ref 0.0–0.4)
Alpha-2-Globulin: 1 g/dL (ref 0.4–1.0)
Beta Globulin: 1.1 g/dL (ref 0.7–1.3)
Gamma Globulin: 0.9 g/dL (ref 0.4–1.8)
Globulin, Total: 3.3 g/dL (ref 2.2–3.9)
Total Protein ELP: 6.9 g/dL (ref 6.0–8.5)

## 2023-01-28 ENCOUNTER — Other Ambulatory Visit: Payer: Self-pay

## 2023-01-28 ENCOUNTER — Inpatient Hospital Stay (HOSPITAL_COMMUNITY): Payer: 59

## 2023-01-28 ENCOUNTER — Encounter (HOSPITAL_COMMUNITY): Payer: Self-pay

## 2023-01-28 ENCOUNTER — Emergency Department (HOSPITAL_COMMUNITY): Payer: 59

## 2023-01-28 ENCOUNTER — Observation Stay (HOSPITAL_COMMUNITY)
Admission: EM | Admit: 2023-01-28 | Discharge: 2023-01-30 | Disposition: A | Discharge status: Home / Self Care | Payer: 59 | Attending: Family Medicine | Admitting: Family Medicine

## 2023-01-28 DIAGNOSIS — I639 Cerebral infarction, unspecified: Secondary | ICD-10-CM

## 2023-01-28 DIAGNOSIS — F447 Conversion disorder with mixed symptom presentation: Secondary | ICD-10-CM

## 2023-01-28 DIAGNOSIS — Z79899 Other long term (current) drug therapy: Secondary | ICD-10-CM | POA: Diagnosis not present

## 2023-01-28 DIAGNOSIS — N179 Acute kidney failure, unspecified: Secondary | ICD-10-CM | POA: Diagnosis not present

## 2023-01-28 DIAGNOSIS — Z1152 Encounter for screening for COVID-19: Secondary | ICD-10-CM | POA: Diagnosis not present

## 2023-01-28 DIAGNOSIS — I10 Essential (primary) hypertension: Secondary | ICD-10-CM | POA: Insufficient documentation

## 2023-01-28 DIAGNOSIS — Z8547 Personal history of malignant neoplasm of testis: Secondary | ICD-10-CM | POA: Insufficient documentation

## 2023-01-28 DIAGNOSIS — Z7982 Long term (current) use of aspirin: Secondary | ICD-10-CM | POA: Diagnosis not present

## 2023-01-28 DIAGNOSIS — I480 Paroxysmal atrial fibrillation: Secondary | ICD-10-CM | POA: Diagnosis not present

## 2023-01-28 DIAGNOSIS — Z8673 Personal history of transient ischemic attack (TIA), and cerebral infarction without residual deficits: Secondary | ICD-10-CM | POA: Insufficient documentation

## 2023-01-28 DIAGNOSIS — R4182 Altered mental status, unspecified: Secondary | ICD-10-CM | POA: Diagnosis present

## 2023-01-28 DIAGNOSIS — R531 Weakness: Principal | ICD-10-CM

## 2023-01-28 DIAGNOSIS — M5127 Other intervertebral disc displacement, lumbosacral region: Secondary | ICD-10-CM | POA: Insufficient documentation

## 2023-01-28 DIAGNOSIS — T68XXXA Hypothermia, initial encounter: Secondary | ICD-10-CM

## 2023-01-28 LAB — CBC
HCT: 36.8 % — ABNORMAL LOW (ref 39.0–52.0)
Hemoglobin: 12.2 g/dL — ABNORMAL LOW (ref 13.0–17.0)
MCH: 30.5 pg (ref 26.0–34.0)
MCHC: 33.2 g/dL (ref 30.0–36.0)
MCV: 92 fL (ref 80.0–100.0)
Platelets: 343 10*3/uL (ref 150–400)
RBC: 4 MIL/uL — ABNORMAL LOW (ref 4.22–5.81)
RDW: 12.3 % (ref 11.5–15.5)
WBC: 10.6 10*3/uL — ABNORMAL HIGH (ref 4.0–10.5)
nRBC: 0 % (ref 0.0–0.2)

## 2023-01-28 LAB — POCT I-STAT, CHEM 8
BUN: 17 mg/dL (ref 6–20)
Calcium, Ion: 1.13 mmol/L — ABNORMAL LOW (ref 1.15–1.40)
Chloride: 104 mmol/L (ref 98–111)
Creatinine, Ser: 0.7 mg/dL (ref 0.61–1.24)
Glucose, Bld: 157 mg/dL — ABNORMAL HIGH (ref 70–99)
HCT: 35 % — ABNORMAL LOW (ref 39.0–52.0)
Hemoglobin: 11.9 g/dL — ABNORMAL LOW (ref 13.0–17.0)
Potassium: 4.2 mmol/L (ref 3.5–5.1)
Sodium: 138 mmol/L (ref 135–145)
TCO2: 25 mmol/L (ref 22–32)

## 2023-01-28 LAB — RAPID URINE DRUG SCREEN, HOSP PERFORMED
Amphetamines: POSITIVE — AB
Barbiturates: NOT DETECTED
Benzodiazepines: NOT DETECTED
Cocaine: NOT DETECTED
Opiates: NOT DETECTED
Tetrahydrocannabinol: POSITIVE — AB

## 2023-01-28 LAB — TSH: TSH: 0.319 u[IU]/mL — ABNORMAL LOW (ref 0.350–4.500)

## 2023-01-28 LAB — AMMONIA: Ammonia: 27 umol/L (ref 9–35)

## 2023-01-28 LAB — ETHANOL
Alcohol, Ethyl (B): <10 mg/dL (ref <10)
Alcohol, Ethyl (B): <10 mg/dL (ref <10)

## 2023-01-28 LAB — COMPREHENSIVE METABOLIC PANEL
ALT: 15 U/L (ref 0–44)
AST: 13 U/L — ABNORMAL LOW (ref 15–41)
Albumin: 3.4 g/dL — ABNORMAL LOW (ref 3.5–5.0)
Alkaline Phosphatase: 85 U/L (ref 38–126)
Anion gap: 10 (ref 5–15)
BUN: 16 mg/dL (ref 6–20)
CO2: 23 mmol/L (ref 22–32)
Calcium: 9.2 mg/dL (ref 8.9–10.3)
Chloride: 104 mmol/L (ref 98–111)
Creatinine, Ser: 0.65 mg/dL (ref 0.61–1.24)
GFR, Estimated: >60 mL/min (ref >60)
Glucose, Bld: 156 mg/dL — ABNORMAL HIGH (ref 70–99)
Potassium: 4.2 mmol/L (ref 3.5–5.1)
Sodium: 137 mmol/L (ref 135–145)
Total Bilirubin: 0.5 mg/dL (ref <1.2)
Total Protein: 7.1 g/dL (ref 6.5–8.1)

## 2023-01-28 LAB — DIFFERENTIAL
Abs Immature Granulocytes: 0.05 10*3/uL (ref 0.00–0.07)
Basophils Absolute: 0 10*3/uL (ref 0.0–0.1)
Basophils Relative: 0 %
Eosinophils Absolute: 0 10*3/uL (ref 0.0–0.5)
Eosinophils Relative: 0 %
Immature Granulocytes: 1 %
Lymphocytes Relative: 12 %
Lymphs Abs: 1.3 10*3/uL (ref 0.7–4.0)
Monocytes Absolute: 0.2 10*3/uL (ref 0.1–1.0)
Monocytes Relative: 2 %
Neutro Abs: 9.1 10*3/uL — ABNORMAL HIGH (ref 1.7–7.7)
Neutrophils Relative %: 85 %

## 2023-01-28 LAB — URINALYSIS, ROUTINE W REFLEX MICROSCOPIC
Bilirubin Urine: NEGATIVE
Glucose, UA: NEGATIVE mg/dL
Hgb urine dipstick: NEGATIVE
Ketones, ur: NEGATIVE mg/dL
Leukocytes,Ua: NEGATIVE
Nitrite: NEGATIVE
Protein, ur: NEGATIVE mg/dL
Specific Gravity, Urine: 1.033 — ABNORMAL HIGH (ref 1.005–1.030)
pH: 6 (ref 5.0–8.0)

## 2023-01-28 LAB — SARS CORONAVIRUS 2 BY RT PCR: SARS Coronavirus 2 by RT PCR: NEGATIVE

## 2023-01-28 LAB — APTT: aPTT: 30 s (ref 24–36)

## 2023-01-28 LAB — T4, FREE: Free T4: 1.08 ng/dL (ref 0.61–1.12)

## 2023-01-28 LAB — PROTIME-INR
INR: 1.1 (ref 0.8–1.2)
Prothrombin Time: 14.2 s (ref 11.4–15.2)

## 2023-01-28 LAB — VITAMIN B12: Vitamin B-12: 210 pg/mL (ref 180–914)

## 2023-01-28 MED ORDER — ENOXAPARIN SODIUM 40 MG/0.4ML IJ SOSY
40.0000 mg | PREFILLED_SYRINGE | INTRAMUSCULAR | Status: DC
  Administered 2023-01-29 (×2): 40 mg via SUBCUTANEOUS
  Filled 2023-01-28 (×2): qty 0.4

## 2023-01-28 MED ORDER — IOHEXOL 350 MG/ML SOLN
100.0000 mL | Freq: Once | INTRAVENOUS | Status: AC | PRN
  Administered 2023-01-28: 100 mL via INTRAVENOUS

## 2023-01-28 NOTE — ED Notes (Signed)
Unable to obtain pt temperature after various attempts.

## 2023-01-28 NOTE — ED Notes (Signed)
Patient transported to MRI 

## 2023-01-28 NOTE — Progress Notes (Signed)
Pt in MRI, eeg to be completed as schedule allows

## 2023-01-28 NOTE — ED Notes (Signed)
Matthew Gurney, MD notified that pt is still not as responsive after coming back from MRI

## 2023-01-28 NOTE — ED Notes (Signed)
ED TO INPATIENT HANDOFF REPORT  ED Nurse Name and Phone #: Delice Bison, RN  S Name/Age/Gender Margaretha Seeds. 42 y.o. male Room/Bed: 023C/023C  Code Status   Code Status: Full Code  Home/SNF/Other Home Patient oriented to: self, place, time, and situation Is this baseline? Yes   Triage Complete: Triage complete  Chief Complaint AMS (altered mental status) [R41.82]  Triage Note [facility] reports pt is coming from a parking lot. Pt called his wife and told her something was wrong, wife then called 911. Upon EMS arrival pt with left side flaccid, left facial droop, dysarthria. Wife states pt was last seen normal at 1400. Pt w/previous hx of strokes. Pt states he is having lower back pain for the past few days and getting worse.   Allergies Allergies  Allergen Reactions   Ciprofloxacin    Clindamycin/Lincomycin    Ibuprofen    Naproxen    Nsaids    Tetracyclines & Related     Level of Care/Admitting Diagnosis ED Disposition     ED Disposition  Admit   Condition  --   Comment  Hospital Area: [facility] [[id number]]  Level of Care: Telemetry Medical [[id number]]  May admit patient to [name] or [facility] if equivalent level of care is available:: No  Covid Evaluation: Asymptomatic - no recent exposure (last 10 days) testing not required  Diagnosis: AMS (altered mental status) [[id number]]  Admitting Physician: Doreene Eland [[id number]]  Attending Physician: Doreene Eland [[id number]]  Certification:: I certify this patient will need inpatient services for at least 2 midnights  Expected Medical Readiness: 01/30/2023          B Medical/Surgery History Past Medical History:  Diagnosis Date   Bladder disorder    Hypokalemia 01/04/2023   Past Surgical History:  Procedure Laterality Date   BACK SURGERY       A IV Location/Drains/Wounds Patient Lines/Drains/Airways Status     Active Line/Drains/Airways     Name Placement date Placement time Site Days    Peripheral IV 01/28/23 18 G 1" Anterior;Distal;Right;Upper Arm 01/28/23  1610  Arm  less than 1   Peripheral IV 01/28/23 18 G 1" Anterior;Distal;Left;Upper Arm 01/28/23  1610  Arm  less than 1            Intake/Output Last 24 hours No intake or output data in the 24 hours ending 01/28/23 1849  Labs/Imaging Results for orders placed or performed during the hospital encounter of 01/28/23 (from the past 48 hour(s))  Ethanol     Status: None   Collection Time: 01/28/23  4:19 PM  Result Value Ref Range   Alcohol, Ethyl (B) <10 <10 mg/dL    Comment: (NOTE) Lowest detectable limit for serum alcohol is 10 mg/dL.  For medical purposes only. Performed at [facility], [address], [place], [place] [address]   Protime-INR     Status: None   Collection Time: 01/28/23  4:19 PM  Result Value Ref Range   Prothrombin Time 14.2 11.4 - 15.2 seconds   INR 1.1 0.8 - 1.2    Comment: (NOTE) INR goal varies based on device and disease states. Performed at [facility], [address] [place], [place] [address]   APTT     Status: None   Collection Time: 01/28/23  4:19 PM  Result Value Ref Range   aPTT 30 24 - 36 seconds    Comment: Performed at [facility], [address] [place], [place] [address]  CBC     Status: Abnormal   Collection Time: 01/28/23  4:19 PM  Result Value Ref Range   WBC 10.6 (H) 4.0 - 10.5 K/uL   RBC 4.00 (L) 4.22 - 5.81 MIL/uL   Hemoglobin 12.2 (L) 13.0 - 17.0 g/dL   HCT 65.7 (L) 84.6 - 96.2 %   MCV 92.0 80.0 - 100.0 fL   MCH 30.5 26.0 - 34.0 pg   MCHC 33.2 30.0 - 36.0 g/dL   RDW 95.2 84.1 - 32.4 %   Platelets 343 150 - 400 K/uL   nRBC 0.0 0.0 - 0.2 %    Comment: Performed at [facility], [address], [place], [place] [address]  Differential     Status: Abnormal   Collection Time: 01/28/23  4:19 PM  Result Value Ref Range   Neutrophils Relative % 85 %   Neutro Abs 9.1 (H) 1.7 - 7.7 K/uL   Lymphocytes Relative 12 %   Lymphs Abs  1.3 0.7 - 4.0 K/uL   Monocytes Relative 2 %   Monocytes Absolute 0.2 0.1 - 1.0 K/uL   Eosinophils Relative 0 %   Eosinophils Absolute 0.0 0.0 - 0.5 K/uL   Basophils Relative 0 %   Basophils Absolute 0.0 0.0 - 0.1 K/uL   Immature Granulocytes 1 %   Abs Immature Granulocytes 0.05 0.00 - 0.07 K/uL    Comment: Performed at [facility], [address], [place], [place] [address]  Comprehensive metabolic panel     Status: Abnormal   Collection Time: 01/28/23  4:19 PM  Result Value Ref Range   Sodium 137 135 - 145 mmol/L   Potassium 4.2 3.5 - 5.1 mmol/L   Chloride 104 98 - 111 mmol/L   CO2 23 22 - 32 mmol/L   Glucose, Bld 156 (H) 70 - 99 mg/dL    Comment: Glucose reference range applies only to samples taken after fasting for at least 8 hours.   BUN 16 6 - 20 mg/dL   Creatinine, Ser 6.44 0.61 - 1.24 mg/dL   Calcium 9.2 8.9 - 03.4 mg/dL   Total Protein 7.1 6.5 - 8.1 g/dL   Albumin 3.4 (L) 3.5 - 5.0 g/dL   AST 13 (L) 15 - 41 U/L   ALT 15 0 - 44 U/L   Alkaline Phosphatase 85 38 - 126 U/L   Total Bilirubin 0.5 <1.2 mg/dL   GFR, Estimated >74 >25 mL/min    Comment: (NOTE) Calculated using the CKD-EPI Creatinine Equation (2021)    Anion gap 10 5 - 15    Comment: Performed at [facility], [address], [place], [place] [address]  Urine rapid drug screen (hosp performed)     Status: Abnormal   Collection Time: 01/28/23  4:19 PM  Result Value Ref Range   Opiates NONE DETECTED NONE DETECTED   Cocaine NONE DETECTED NONE DETECTED   Benzodiazepines NONE DETECTED NONE DETECTED   Amphetamines POSITIVE (A) NONE DETECTED   Tetrahydrocannabinol POSITIVE (A) NONE DETECTED   Barbiturates NONE DETECTED NONE DETECTED    Comment: (NOTE) DRUG SCREEN FOR MEDICAL PURPOSES ONLY.  IF CONFIRMATION IS NEEDED FOR ANY PURPOSE, NOTIFY LAB WITHIN 5 DAYS.  LOWEST DETECTABLE LIMITS FOR URINE DRUG SCREEN Drug Class                     Cutoff (ng/mL) Amphetamine and metabolites     1000 Barbiturate and metabolites  200 Benzodiazepine                 200 Opiates and metabolites        300 Cocaine and metabolites        300 THC                            50 Performed at [facility], [address], [place], [place] [address]   I-STAT, [place] 8     Status: Abnormal   Collection Time: 01/28/23  4:27 PM  Result Value Ref Range   Sodium 138 135 - 145 mmol/L   Potassium 4.2 3.5 - 5.1 mmol/L   Chloride 104 98 - 111 mmol/L   BUN 17 6 - 20 mg/dL   Creatinine, Ser 9.56 0.61 - 1.24 mg/dL   Glucose, Bld 213 (H) 70 - 99 mg/dL    Comment: Glucose reference range applies only to samples taken after fasting for at least 8 hours.   Calcium, Ion 1.13 (L) 1.15 - 1.40 mmol/L   TCO2 25 22 - 32 mmol/L   Hemoglobin 11.9 (L) 13.0 - 17.0 g/dL   HCT 08.6 (L) 57.8 - 46.9 %  Urinalysis, Routine w reflex microscopic -Urine, Clean Catch     Status: Abnormal   Collection Time: 01/28/23  5:50 PM  Result Value Ref Range   Color, Urine YELLOW YELLOW   APPearance CLEAR CLEAR   Specific Gravity, Urine 1.033 (H) 1.005 - 1.030   pH 6.0 5.0 - 8.0   Glucose, UA NEGATIVE NEGATIVE mg/dL   Hgb urine dipstick NEGATIVE NEGATIVE   Bilirubin Urine NEGATIVE NEGATIVE   Ketones, ur NEGATIVE NEGATIVE mg/dL   Protein, ur NEGATIVE NEGATIVE mg/dL   Nitrite NEGATIVE NEGATIVE   Leukocytes,Ua NEGATIVE NEGATIVE    Comment: Performed at [facility], [address], [place], [place] [address]   CT ANGIO HEAD NECK W WO CM W PERF (CODE STROKE)  Result Date: 01/28/2023 CLINICAL DATA:  Neuro deficit, acute, stroke suspected. Left-sided deficit and left-sided weakness. EXAM: CT ANGIOGRAPHY HEAD AND NECK CT PERFUSION BRAIN TECHNIQUE: Multidetector CT imaging of the head and neck was performed using the standard protocol during bolus administration of intravenous contrast. Multiplanar CT image reconstructions and MIPs were obtained to evaluate the vascular anatomy. Carotid stenosis measurements (when  applicable) are obtained utilizing NASCET criteria, using the distal internal carotid diameter as the denominator. Multiphase CT imaging of the brain was performed following IV bolus contrast injection. Subsequent parametric perfusion maps were calculated using RAPID software. RADIATION DOSE REDUCTION: This exam was performed according to the departmental dose-optimization program which includes automated exposure control, adjustment of the mA and/or kV according to patient size and/or use of iterative reconstruction technique. CONTRAST:  OMNIPAQUE IOHEXOL 350 MG/ML SOLN COMPARISON:  Head CT 01/28/2023. FINDINGS: CTA NECK FINDINGS Aortic arch: Three-vessel arch configuration. Arch vessel origins are patent. Right carotid system: No evidence of dissection, stenosis (50% or greater) or occlusion. Left carotid system: No evidence of dissection, stenosis (50% or greater) or occlusion. Vertebral arteries: Codominant. No evidence of dissection, stenosis (50% or greater) or occlusion. Skeleton: Unremarkable. Other neck: Unremarkable. Upper chest: Unremarkable. Review of the MIP images confirms the above findings CTA HEAD FINDINGS Anterior circulation: Intracranial ICAs are patent without stenosis or aneurysm. The proximal ACAs and MCAs are patent without stenosis or aneurysm. Distal branches are symmetric. Posterior circulation: Normal basilar artery. The SCAs, AICAs and PICAs  are patent proximally. The PCAs are patent proximally without stenosis or aneurysm. Distal branches are symmetric. Venous sinuses: As permitted by contrast timing, patent. 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 Review of the MIP images confirms the above findings CT Brain Perfusion Findings: ASPECTS: 10 CBF (<30%) Volume: 0mL Perfusion (Tmax>6.0s) volume: 0mL Mismatch Volume: 0mL Infarction Location:Not applicable. IMPRESSION: 1. No large vessel occlusion or hemodynamically significant  stenosis in the head or neck. 2. No evidence of core infarct or ischemic penumbra on CT perfusion. Code stroke imaging results were communicated on 01/28/2023 at 4:44 pm to provider Dr. Otelia Limes via secure text paging. Electronically Signed   By: Orvan Falconer M.D.   On: 01/28/2023 16:48   CT HEAD CODE STROKE WO CONTRAST  Result Date: 01/28/2023 CLINICAL DATA:  Code stroke. Neuro deficit, acute, stroke suspected. Left-sided weakness. EXAM: CT HEAD WITHOUT CONTRAST TECHNIQUE: Contiguous axial images were obtained from the base of the skull through the vertex without intravenous contrast. RADIATION DOSE REDUCTION: This exam was performed according to the departmental dose-optimization program which includes automated exposure control, adjustment of the mA and/or kV according to patient size and/or use of iterative reconstruction technique. COMPARISON:  MRI 01/04/2023 FINDINGS: Brain: Normal appearance without evidence of malformation, old or acute infarction, mass lesion, hemorrhage, hydrocephalus or extra-axial collection. Vascular: No abnormal vascular finding. Skull: Normal Sinuses/Orbits: Clear except for a retention cyst in the left maxillary sinus. Orbits negative. Other: None ASPECTS (Alberta Stroke Program Early CT Score) - Ganglionic level infarction (caudate, lentiform nuclei, internal capsule, insula, M1-M3 cortex): 7 - Supraganglionic infarction (M4-M6 cortex): 3 Total score (0-10 with 10 being normal): 10 IMPRESSION: 1. Normal head CT. 2. Aspects is 10. 3. These results were communicated to Dr. Otelia Limes at 4:30 pm on 01/28/2023 by text page via the [facility] messaging system. Electronically Signed   By: Paulina Fusi M.D.   On: 01/28/2023 16:31    Pending Labs Unresulted Labs (From admission, onward)     Start     Ordered   01/29/23 0500  Comprehensive metabolic panel  Tomorrow morning,   R        01/28/23 1846   01/29/23 0500  CBC  Tomorrow morning,   R        01/28/23 1846   01/28/23 1813   Ethanol  Once,   URGENT        01/28/23 1827   01/28/23 1726  T4, free  Once,   URGENT        01/28/23 1725   01/28/23 1725  TSH  Once,   URGENT        01/28/23 1725            Vitals/Pain Today's Vitals   01/28/23 1745 01/28/23 1800 01/28/23 1830 01/28/23 1836  BP: 110/66 109/68 99/63   Pulse: 64 69 69   Resp: 10  13   Temp:    (S) (!) 96 F (35.6 C)  TempSrc:    (S) Rectal  SpO2: 96% 95% 96%   PainSc:        Isolation Precautions No active isolations  Medications Medications  iohexol (OMNIPAQUE) 350 MG/ML injection 100 mL (100 mLs Intravenous Contrast Given 01/28/23 1631)    Mobility walks     Focused Assessments Neuro Assessment Handoff:  Swallow screen pass? No    NIH Stroke Scale  Interval: Initial Level of Consciousness (1a.)   : Not alert, requires repeated stimulation to attend, or  is obtunded and requires strong or painful stimulation to make movements (not stereotyped) LOC Questions (1b. )   : Answers neither question correctly LOC Commands (1c. )   : Performs both tasks correctly Best Gaze (2. )  : Normal Visual (3. )  : No visual loss Facial Palsy (4. )    : Partial paralysis  Motor Arm, Left (5a. )   : No movement Motor Arm, Right (5b. ) : Drift Motor Leg, Left (6a. )  : No movement Motor Leg, Right (6b. ) : No effort against gravity Limb Ataxia (7. ): Absent Sensory (8. )  : Severe to total sensory loss, patient is not aware of being touched in the face, arm, and leg Best Language (9. )  : No aphasia (was able to say wifes name, "my lower back hurts" and follows verbal commands) Dysarthria (10. ): Severe dysarthria, patient's speech is so slurred as to be unintelligible in the absence of or out of proportion to any dysphasia, or is mute/anarthric Extinction/Inattention (11.)   : No Abnormality Complete NIHSS TOTAL: 22 Last date known well: (S) 01/28/23 Last time known well: (S) 1400 Neuro Assessment: Exceptions to WDL Neuro Checks:    Initial (01/28/23 1645)  Has TPA been given? No If patient is a Neuro Trauma and patient is going to OR before floor call report to 4N Charge nurse: [phone] or [phone]   R Recommendations: See Admitting Provider Note  Report given to:   Additional Notes:

## 2023-01-28 NOTE — Plan of Care (Addendum)
Went to bedside to assess patient.  Heart rate respiratory rate and O2 saturation all stable.  Still hypothermic.  Pupils are dilated and sluggish.  Patient is somnolent, responds with moans only to loud voice and sternal rubbing. Low threshold to consult CCM.   Family medicine teaching service will admit patient.  H&P note to come.

## 2023-01-28 NOTE — ED Notes (Signed)
Pt tx to MRI

## 2023-01-28 NOTE — Consult Note (Signed)
NEURO HOSPITALIST CONSULT NOTE   Requesting physician: Dr. Adela Lank  Reason for Consult: Acute onset of dysarthria, left sided weakness and left facial droop  History obtained from:  EMS, Patient and Chart     HPI:                                                                                                                                          Matthew Armstrong. is a 42 y.o. male with a stated PMHx of 3 strokes with last stroke stated to be in 2018 (no evidence for any old strokes on CT obtained in the ED today after arrival), bladder disorder, hypokalemia, stated prior back surgery, stated to be taking Eliquis for prior strokes with last dose stated to be at 11 AM today, who presents to the ED via EMS as a Code Stroke. LKN was 2:00 PM. He was in a parking lot when he called his wife stating that "something was wrong" and wife then called 911. Upon EMS arrival he was found sitting on a curb, leaning to the left. EMS states that no other people were in the parking lot when they arrive. On assessing him, EMS noted that his left side was flaccid, that he had left facial droop, and dysarthria. On communication with wife by EMS she stated that he was last seen normal at 1400. He had apparently called his wife at 3:50 PM for "feeling off". The patient's speech was too garbled for EMS to obtain much history from him. BP was 124/82. On arrival to the ED the patient endorsed that he was having lower back pain for the past few days and that it was getting worse.   He endorses 9/10 low back pain. He is hypothermic in the ED with dilated reactive pupils and frequent yawning and shivering. He denies opiate use. EtOH and cigarette use is listed in his Social History in Epic. He has listed allergies to naproxen and NSAIDs.   Home meds listed in Epic include ASA, atorvastatin, Eliquis, gabapentin 300 mg TID and Zoloft.    He was previously seen as a code stroke in October. Per Dr. Bess Harvest  consult note at that time: "42 y.o. male with past medical history of A fib on Eliquis, HTN, anxiety, GERD, spinal cord injury with neurogenic bladder, PTSD, prior CVA, functional neurological symptoms disorder with weakness, chronic pain who presents from home via EMS for acute onset of left side weakness and aphasia. NIHSS 18. Functional neurological disorder-with mixed symptoms of motor and sensory loss as well as speech disturbance."  Past Medical History:  Diagnosis Date   Bladder disorder    Hypokalemia 01/04/2023    Past Surgical History:  Procedure Laterality Date   BACK SURGERY      No  family history on file.            Social History:  reports that he has been smoking cigarettes. He has never used smokeless tobacco. He reports current alcohol use. He reports that he does not use drugs.  Allergies  Allergen Reactions   Ciprofloxacin    Clindamycin/Lincomycin    Ibuprofen    Naproxen    Nsaids    Tetracyclines & Related     MEDICATIONS:                                                                                                                      No current facility-administered medications on file prior to encounter.   Current Outpatient Medications on File Prior to Encounter  Medication Sig Dispense Refill   acetaminophen (TYLENOL) 325 MG tablet Take 650 mg by mouth once as needed for moderate pain (pain score 4-6).     apixaban (ELIQUIS) 5 MG TABS tablet Take 5 mg by mouth 2 (two) times daily.     aspirin EC 81 MG tablet Take 81 mg by mouth daily. Swallow whole.     atorvastatin (LIPITOR) 80 MG tablet Take 1 tablet by mouth at bedtime.     gabapentin (NEURONTIN) 300 MG capsule Take 1 capsule (300 mg total) by mouth 3 (three) times daily. 90 capsule 3   lisinopril (ZESTRIL) 10 MG tablet Take 10 mg by mouth daily.     prazosin (MINIPRESS) 2 MG capsule Take 2 mg by mouth at bedtime.     sertraline (ZOLOFT) 50 MG tablet Take 1 tablet (50 mg total) by mouth daily.  30 tablet 3     ROS:                                                                                                                                       As per HPI. Detailed ROS deferred in the context of acuity of presentation.    There were no vitals taken for this visit.   General Examination:  Physical Exam  HEENT:  [place]/AT. Conjunctivae appear normal without ongoing lacrimation.  Lungs: Respirations unlabored Extremities/Skin: No edema. Skin is warm to touch in the setting of his shivering, with no rash, piloerection or cyanosis. Nondiaphoretic.   Neurological Examination Mental Status: Awake and alert. Speech is severely dysarthric but fluent, with intact naming and comprehension. His dysarthria significantly improves transiently when asked if he uses opiates, exclaiming "no way, I have kids".  No hemineglect.  Cranial Nerves: II: Temporal visual fields intact to confrontation bilaterally, as well as when testing blink to threat. States that vision in his left eye is blurry. Pupils 7 mm and briskly reactive bilaterally.   III,IV, VI: No ptosis. EOMI. No nystagmus.  V: Subjectively insensate to left side of face. VII: When asked to smile, left side of face does not contract, but when distracted this improves but does not completely resolve. Upper quadrants of face with normal movement.  VIII: Hearing intact to conversation IX,X: No hoarseness or hypophonia XI: Symmetric, able to move head to left and right.  XII: Tongue deviates to RIGHT when extended.  Motor: RUE 5/5 RLE 5/5 LUE with no voluntary movement. Drops to bed immediately after passive elevation and release.  LLE with no voluntary movement to command, but tenses muscles, moves at thigh slightly to left and right, internally rotates at thigh and plantar flexes consistent with volitional movement upon sudden noxious  plantar stimulation.  Tone in BLE is normal and symmetric.  Tone in LUE alternates from increased to flaccid to normal on separate trials.  Positive Hoover's sign on the left when testing RLE movement against resistance.  Sensory: Subjectively insensate to temp, FT and pinch on the left, but moves and grimaces slightly with pinch to left thigh and left arm Deep Tendon Reflexes: 2+ and symmetric biceps, brachioradialis, patellar and achilles reflexes bilaterally. Toes downgoing.  Cerebellar: No ataxia with FNF on the right. Does not perform on the left.  Gait: Deferred    Lab Results: Basic Metabolic Panel: No results for input(s): "NA", "K", "CL", "CO2", "GLUCOSE", "BUN", "CREATININE", "CALCIUM", "MG", "PHOS" in the last 168 hours.  CBC: No results for input(s): "WBC", "NEUTROABS", "HGB", "HCT", "MCV", "PLT" in the last 168 hours.  Cardiac Enzymes: No results for input(s): "CKTOTAL", "CKMB", "CKMBINDEX", "TROPONINI" in the last 168 hours.  Lipid Panel: No results for input(s): "CHOL", "TRIG", "HDL", "CHOLHDL", "VLDL", "LDLCALC" in the last 168 hours.  Imaging: No results found.   Assessment  42 year old male presenting with acute onset of left sided weakness, dysarthria and subjective left hemisensory loss in conjunction with frequent yawning, chills, rigors, dilated reactive pupils, anxious affect and complaint of 9/10 low back pain. - Exam reveals fluctuating dysarthria, fluctuating left facial droop, left sided weakness with positive left Hoover's sign, and left hemisensory loss subjectively, but with reactions to noxious stimuli on the left suggestive of attempts to suppress involuntary withdrawal.  - CT head is normal. Of note, the patient endorses 3 prior strokes, but no evidence for such is visible on CT.  - CTA of head and neck:  No large vessel occlusion or hemodynamically significant stenosis in the head or neck. - CTP: No evidence of core infarct or ischemic penumbra on CT  perfusion. - Labs: - EtOH < 10 - TSH 0.319 - Coags are normal.  - CBC with elevated WBC of 10.6 and neutrophilic predominance.  - Transaminases are not elevated - Ionized Ca slightly low at 1.13 - Glucose 157 - BUN and  Cr are normal - DDx: - He looked like a stroke when he came in but has no LVO on CTA and negative CT perfusion. There are some functional or volitional/malingering features on his exam. - His pupils were dilated and reactive on presentation, but got smaller between arrival and about 45 minutes later on second exam. He denies opiate withdrawal, but he is yawning, shivering and hypothermic and again, his pupils were dilated. Opiate withdrawal is on the DDx.  - Illicit substance intoxication is on the DDx.  - No symptoms consistent with seizure. Has no history of seizure.  - Claims to have had 3 prior strokes in the past, but no evidence of such on CT. Malingering/factitious disorder may be an overlay on his current presentation - Complains of 9/10 back pain. Exam not suggestive of acute spinal cord or cauda equina pathology. If he requests opiates that may shift DDx in favor of drug seeking behavior, but that does not rule out other pathology.    Recommendations: - UDS (ordered) - STAT MRI brain (ordered) - Will need imaging of his lumbar spine to assess for possible L-spine pathology. DDx for such includes fracture, muscle spasm, disc extrusion, and epidural abscess.  - Blood cultures x 2 - Monitor for possible opiate withdrawal symptoms.  - Monitor for possible development of additional symptoms of infection - I have signed out to Dr. Wilford Corner.  - Case discussed with Family Medicine   Addendum: - UDS is positive for amphetamines and THC. No amphetamines are on his home meds list.  - U/A not consistent with infection - MRI brain completed. No abnormalities seen on preliminary review. Official Radiology report is pending.    Electronically signed: Dr. Caryl Pina 01/28/2023, 4:26 PM
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
## 2023-01-28 NOTE — Plan of Care (Addendum)
Arrived at patient's bedside after being notified by nurse about concerns of increasing somnolent after MRI.  On arrival nurse was at patient's bedside and patient was laying in bed comfortably.  Patient does not appear to be in any acute distress.  Was able to wake up to vocal stimuli and pain stimuli.  Was oriented to place, time and person.  Cardiopulmonary exam were unremarkable.  Overall exam unchanged from prior encounter couple hours ago.  Will continue close monitoring of patient with neurochecks.

## 2023-01-28 NOTE — Assessment & Plan Note (Signed)
Initially came in with AMS and difficulty speaking.  On my exam, patient was oriented to person, place, time, situation.  He did have some dysarthria, this waxed and waned.  Given his UDS being positive for amphetamines and he has no amphetamines on his medication list, I suspect his altered mental status could be due to drug use prior to arrival.  Also could be in the setting of hypothermia.  His brain imaging was negative for stroke, CTA head and neck was also negative. - Avoid CNS acting medications - Neurochecks every 4 hours - Fall precautions - N.p.o. until patient can pass bedside swallow

## 2023-01-28 NOTE — ED Triage Notes (Signed)
[facility] reports pt is coming from a parking lot. Pt called his wife and told her something was wrong, wife then called 911. Upon EMS arrival pt with left side flaccid, left facial droop, dysarthria. Wife states pt was last seen normal at 1400. Pt w/previous hx of strokes. Pt states he is having lower back pain for the past few days and getting worse.

## 2023-01-28 NOTE — ED Notes (Signed)
Bair hugger applied.

## 2023-01-28 NOTE — Assessment & Plan Note (Deleted)
Echo in Sept 2024 with EF 60-65%, mild mitral regurg. UDS positive for amphetamines, THC. --admit to FMTS, progressive care, Dr Lum Babe attending --narcan  --blood cultures --continuous telemetry --EEG --ammonia --RPR --COVID/flu test

## 2023-01-28 NOTE — ED Provider Notes (Signed)
[place] [facility] DE[facility]MENT AT [facility] Provider Note   CSN: [id number] Arrival date & time: 01/28/23  1617  An emergency department physician performed an initial assessment on this suspected stroke patient at 1632.  History  Chief Complaint  Patient presents with   Code Stroke    Matthew Armstrong. is a 42 y.o. male.  42 yo M with a chief complaints left-sided weakness.  This started acutely about 2:00 today.  Patient arrived as a code stroke.  Level 5 caveat acuity of condition.        Home Medications Prior to Admission medications   Medication Sig Start Date End Date Taking? Authorizing Provider  acetaminophen (TYLENOL) 325 MG tablet Take 650 mg by mouth once as needed for moderate pain (pain score 4-6).    [provider]  albuterol (VENTOLIN HFA) 108 (90 Base) MCG/ACT inhaler Inhale 1-2 puffs into the lungs every 6 (six) hours as needed for wheezing or shortness of breath. 05/17/19   [provider]  apixaban (ELIQUIS) 5 MG TABS tablet Take 5 mg by mouth 2 (two) times daily.    [provider]  aspirin EC 81 MG tablet Take 81 mg by mouth daily. Swallow whole.    [provider]  atorvastatin (LIPITOR) 80 MG tablet Take 1 tablet by mouth at bedtime.    [provider]  famotidine (PEPCID) 20 MG tablet Take 20 mg by mouth daily.    [provider]  gabapentin (NEURONTIN) 300 MG capsule Take 1 capsule (300 mg total) by mouth 3 (three) times daily. 01/07/23   Dameron, Nolberto Hanlon, DO  lisinopril (ZESTRIL) 10 MG tablet Take 10 mg by mouth daily.    [provider]  prazosin (MINIPRESS) 2 MG capsule Take 2 mg by mouth at bedtime. 05/10/21   [provider]  sertraline (ZOLOFT) 50 MG tablet Take 1 tablet (50 mg total) by mouth daily. 01/07/23   Dameron, Nolberto Hanlon, DO      Allergies    Ciprofloxacin, Clindamycin/lincomycin, Ibuprofen, Naproxen, Nsaids, and Tetracyclines & related    Review of  Systems   Review of Systems  Physical Exam Updated Vital Signs BP 99/63   Pulse 69   Temp (S) (!) 96 F (35.6 C) (Rectal)   Resp 13   SpO2 96%  Physical Exam Vitals and nursing note reviewed.  Constitutional:      Appearance: He is well-developed.  HENT:     Head: Normocephalic and atraumatic.  Eyes:     Pupils: Pupils are equal, round, and reactive to light.  Neck:     Vascular: No JVD.  Cardiovascular:     Rate and Rhythm: Normal rate and regular rhythm.     Heart sounds: No murmur heard.    No friction rub. No gallop.  Pulmonary:     Effort: No respiratory distress.     Breath sounds: No wheezing.  Abdominal:     General: There is no distension.     Tenderness: There is no abdominal tenderness. There is no guarding or rebound.  Musculoskeletal:        General: Normal range of motion.     Cervical back: Normal range of motion and neck supple.  Skin:    Coloration: Skin is not pale.     Findings: No rash.  Neurological:     Mental Status: He is alert and oriented to person, place, and time.  Psychiatric:        Behavior: Behavior normal.  ED Results / Procedures / Treatments   Labs (all labs ordered are listed, but only abnormal results are displayed) Labs Reviewed  CBC - Abnormal; Notable for the following components:      Result Value   WBC 10.6 (*)    RBC 4.00 (*)    Hemoglobin 12.2 (*)    HCT 36.8 (*)    All other components within normal limits  DIFFERENTIAL - Abnormal; Notable for the following components:   Neutro Abs 9.1 (*)    All other components within normal limits  COMPREHENSIVE METABOLIC PANEL - Abnormal; Notable for the following components:   Glucose, Bld 156 (*)    Albumin 3.4 (*)    AST 13 (*)    All other components within normal limits  RAPID URINE DRUG SCREEN, HOSP PERFORMED - Abnormal; Notable for the following components:   Amphetamines POSITIVE (*)    Tetrahydrocannabinol POSITIVE (*)    All other components within normal  limits  URINALYSIS, ROUTINE W REFLEX MICROSCOPIC - Abnormal; Notable for the following components:   Specific Gravity, Urine 1.033 (*)    All other components within normal limits  POCT I-STAT, CHEM 8 - Abnormal; Notable for the following components:   Glucose, Bld 157 (*)    Calcium, Ion 1.13 (*)    Hemoglobin 11.9 (*)    HCT 35.0 (*)    All other components within normal limits  ETHANOL  PROTIME-INR  APTT  TSH  T4, FREE  ETHANOL  COMPREHENSIVE METABOLIC PANEL  CBC  I-STAT CHEM 8, ED    EKG None  Radiology CT ANGIO HEAD NECK W WO CM W PERF (CODE STROKE)  Result Date: 01/28/2023 CLINICAL DATA:  Neuro deficit, acute, stroke suspected. Left-sided deficit and left-sided weakness. EXAM: CT ANGIOGRAPHY HEAD AND NECK CT PERFUSION BRAIN TECHNIQUE: Multidetector CT imaging of the head and neck was performed using the standard protocol during bolus administration of intravenous contrast. Multiplanar CT image reconstructions and MIPs were obtained to evaluate the vascular anatomy. Carotid stenosis measurements (when applicable) are obtained utilizing NASCET criteria, using the distal internal carotid diameter as the denominator. Multiphase CT imaging of the brain was performed following IV bolus contrast injection. Subsequent parametric perfusion maps were calculated using RAPID software. RADIATION DOSE REDUCTION: This exam was performed according to the departmental dose-optimization program which includes automated exposure control, adjustment of the mA and/or kV according to patient size and/or use of iterative reconstruction technique. CONTRAST:  OMNIPAQUE IOHEXOL 350 MG/ML SOLN COMPARISON:  Head CT 01/28/2023. FINDINGS: CTA NECK FINDINGS Aortic arch: Three-vessel arch configuration. Arch vessel origins are patent. Right carotid system: No evidence of dissection, stenosis (50% or greater) or occlusion. Left carotid system: No evidence of dissection, stenosis (50% or greater) or occlusion.  Vertebral arteries: Codominant. No evidence of dissection, stenosis (50% or greater) or occlusion. Skeleton: Unremarkable. Other neck: Unremarkable. Upper chest: Unremarkable. Review of the MIP images confirms the above findings CTA HEAD FINDINGS Anterior circulation: Intracranial ICAs are patent without stenosis or aneurysm. The proximal ACAs and MCAs are patent without stenosis or aneurysm. Distal branches are symmetric. Posterior circulation: Normal basilar artery. The SCAs, AICAs and PICAs are patent proximally. The PCAs are patent proximally without stenosis or aneurysm. Distal branches are symmetric. Venous sinuses: As permitted by contrast timing, patent. 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 Review of the MIP images confirms the above findings CT Brain Perfusion Findings: ASPECTS: 10 CBF (<30%) Volume: 0mL Perfusion (Tmax>6.0s)  volume: 0mL Mismatch Volume: 0mL Infarction Location:Not applicable. IMPRESSION: 1. No large vessel occlusion or hemodynamically significant stenosis in the head or neck. 2. No evidence of core infarct or ischemic penumbra on CT perfusion. Code stroke imaging results were communicated on 01/28/2023 at 4:44 pm to provider Dr. Otelia Limes via secure text paging. Electronically Signed   By: Orvan Falconer M.D.   On: 01/28/2023 16:48   CT HEAD CODE STROKE WO CONTRAST  Result Date: 01/28/2023 CLINICAL DATA:  Code stroke. Neuro deficit, acute, stroke suspected. Left-sided weakness. EXAM: CT HEAD WITHOUT CONTRAST TECHNIQUE: Contiguous axial images were obtained from the base of the skull through the vertex without intravenous contrast. RADIATION DOSE REDUCTION: This exam was performed according to the departmental dose-optimization program which includes automated exposure control, adjustment of the mA and/or kV according to patient size and/or use of iterative reconstruction technique. COMPARISON:  MRI 01/04/2023 FINDINGS: Brain:  Normal appearance without evidence of malformation, old or acute infarction, mass lesion, hemorrhage, hydrocephalus or extra-axial collection. Vascular: No abnormal vascular finding. Skull: Normal Sinuses/Orbits: Clear except for a retention cyst in the left maxillary sinus. Orbits negative. Other: None ASPECTS (Alberta Stroke Program Early CT Score) - Ganglionic level infarction (caudate, lentiform nuclei, internal capsule, insula, M1-M3 cortex): 7 - Supraganglionic infarction (M4-M6 cortex): 3 Total score (0-10 with 10 being normal): 10 IMPRESSION: 1. Normal head CT. 2. Aspects is 10. 3. These results were communicated to Dr. Otelia Limes at 4:30 pm on 01/28/2023 by text page via the [facility] messaging system. Electronically Signed   By: Paulina Fusi M.D.   On: 01/28/2023 16:31    Procedures .Critical Care  Performed by: Melene Plan, DO Authorized by: Melene Plan, DO   Critical care provider statement:    Critical care time (minutes):  35   Critical care time was exclusive of:  Separately billable procedures and treating other patients   Critical care was time spent personally by me on the following activities:  Development of treatment plan with patient or surrogate, discussions with consultants, evaluation of patient's response to treatment, examination of patient, ordering and review of laboratory studies, ordering and review of radiographic studies, ordering and performing treatments and interventions, pulse oximetry, re-evaluation of patient's condition and review of old charts   Care discussed with: admitting provider       Medications Ordered in ED Medications  iohexol (OMNIPAQUE) 350 MG/ML injection 100 mL (100 mLs Intravenous Contrast Given 01/28/23 1631)    ED Course/ Medical Decision Making/ A&P                                 Medical Decision Making Amount and/or Complexity of Data Reviewed Labs: ordered. Radiology: ordered.  Risk Decision regarding hospitalization.   42 yo M  with a chief complaints of acute onset left-sided weakness.  Patient was actually seen for something similar about a month ago.  At that time he had a negative MRI.  Airway cleared at the bridge.  Taken urgently CT.  CT negative, CTA negative.  Discussed with Dr Otelia Limes, thought to be too inconsistent to give thrombolytics.  Recommends MRI, medical admission.  UA negative for infection.  No significant electrolyte abnormalities.  Patient's core temperature is in the 90s.  I think this likely is environmental.  Will start on a Bair hugger.  Discussed with medicine for admission.  Thyroid studies sent.  The patients results and plan were reviewed and discussed.  Any x-rays performed were independently reviewed by myself.   Differential diagnosis were considered with the presenting HPI.  Medications  iohexol (OMNIPAQUE) 350 MG/ML injection 100 mL (100 mLs Intravenous Contrast Given 01/28/23 1631)    Vitals:   01/28/23 1745 01/28/23 1800 01/28/23 1830 01/28/23 1836  BP: 110/66 109/68 99/63   Pulse: 64 69 69   Resp: 10  13   Temp:    (S) (!) 96 F (35.6 C)  TempSrc:    (S) Rectal  SpO2: 96% 95% 96%     Final diagnoses:  Left-sided weakness  Hypothermia, initial encounter    Admission/ observation were discussed with the admitting physician, patient and/or family and they are comfortable with the plan.          Final Clinical Impression(s) / ED Diagnoses Final diagnoses:  Left-sided weakness  Hypothermia, initial encounter    Rx / DC Orders ED Discharge Orders     None         Melene Plan, DO 01/28/23 1849

## 2023-01-28 NOTE — Code Documentation (Signed)
Matthew Armstrong is a 42 yr old male with an unknown PMH presenting to [facility] via EMS on 01/28/2023. He is coming from a parking lot, where he was last known to be well at 1400. A few minutes before four, he was suddenly unable to move his left side. He called his wife, who called EMS. Pt states that he takes Eliquis.    Pt met at bridge by stroke team. CBG, labs obtained, airway cleared by EDP. Pt to CT with team. NIHSS 22. Pt is lethargic, unable to more left arm or leg, has sensory loss on left, and severe dysarthria. The following imaging was obtained: CT, CTA/P. Per Dr. Otelia Limes, CT negative for hemorrhage, and CTA negative for LVO or perfusion deficit.     Pt back to ED room 23 where his workup will continue. He will need q 2 hr VS and NIHSS for 12 hours then q 4. He will need stroke swallow screen before any po intake. Pt is ineligible for TNK due to Eliquis. He is not candidate for IR due to no LVO. Bedside handoff with ED RN complete.

## 2023-01-29 DIAGNOSIS — R531 Weakness: Principal | ICD-10-CM

## 2023-01-29 DIAGNOSIS — R4182 Altered mental status, unspecified: Secondary | ICD-10-CM | POA: Diagnosis not present

## 2023-01-29 DIAGNOSIS — F447 Conversion disorder with mixed symptom presentation: Secondary | ICD-10-CM

## 2023-01-29 DIAGNOSIS — T68XXXA Hypothermia, initial encounter: Secondary | ICD-10-CM | POA: Diagnosis not present

## 2023-01-29 DIAGNOSIS — M5127 Other intervertebral disc displacement, lumbosacral region: Secondary | ICD-10-CM | POA: Insufficient documentation

## 2023-01-29 LAB — RESPIRATORY PANEL BY PCR

## 2023-01-29 LAB — CBC
HCT: 35.5 % — ABNORMAL LOW (ref 39.0–52.0)
Hemoglobin: 11.6 g/dL — ABNORMAL LOW (ref 13.0–17.0)
MCH: 30.2 pg (ref 26.0–34.0)
MCHC: 32.7 g/dL (ref 30.0–36.0)
MCV: 92.4 fL (ref 80.0–100.0)
Platelets: 346 10*3/uL (ref 150–400)
RBC: 3.84 MIL/uL — ABNORMAL LOW (ref 4.22–5.81)
RDW: 12.5 % (ref 11.5–15.5)
WBC: 10.8 10*3/uL — ABNORMAL HIGH (ref 4.0–10.5)
nRBC: 0 % (ref 0.0–0.2)

## 2023-01-29 LAB — COMPREHENSIVE METABOLIC PANEL
ALT: 13 U/L (ref 0–44)
AST: 12 U/L — ABNORMAL LOW (ref 15–41)
Albumin: 3 g/dL — ABNORMAL LOW (ref 3.5–5.0)
Alkaline Phosphatase: 83 U/L (ref 38–126)
Anion gap: 6 (ref 5–15)
BUN: 14 mg/dL (ref 6–20)
CO2: 28 mmol/L (ref 22–32)
Calcium: 8.7 mg/dL — ABNORMAL LOW (ref 8.9–10.3)
Chloride: 101 mmol/L (ref 98–111)
Creatinine, Ser: 1.27 mg/dL — ABNORMAL HIGH (ref 0.61–1.24)
GFR, Estimated: >60 mL/min (ref >60)
Glucose, Bld: 99 mg/dL (ref 70–99)
Potassium: 3.7 mmol/L (ref 3.5–5.1)
Sodium: 135 mmol/L (ref 135–145)
Total Bilirubin: 0.4 mg/dL (ref <1.2)
Total Protein: 6.7 g/dL (ref 6.5–8.1)

## 2023-01-29 LAB — GLUCOSE, CAPILLARY: Glucose-Capillary: 172 mg/dL — ABNORMAL HIGH (ref 70–99)

## 2023-01-29 LAB — RPR: RPR Ser Ql: NONREACTIVE

## 2023-01-29 MED ORDER — ATORVASTATIN CALCIUM 80 MG PO TABS
80.0000 mg | ORAL_TABLET | Freq: Every day | ORAL | Status: DC
  Administered 2023-01-29: 80 mg via ORAL
  Filled 2023-01-29: qty 1

## 2023-01-29 MED ORDER — ACETAMINOPHEN 500 MG PO TABS
1000.0000 mg | ORAL_TABLET | Freq: Four times a day (QID) | ORAL | Status: DC | PRN
  Administered 2023-01-29: 1000 mg via ORAL
  Filled 2023-01-29: qty 2

## 2023-01-29 MED ORDER — LACTATED RINGERS IV SOLN: INTRAVENOUS | Status: DC

## 2023-01-29 NOTE — Assessment & Plan Note (Deleted)
Temperature 94.5 on admission, improved to 97.6 with Bair hugger.  Suspect hypothermia is likely environmental. -Continue routine vitals -Viral respiratory pathogen panel ordered

## 2023-01-29 NOTE — Assessment & Plan Note (Signed)
Left-sided weakness reported since 1400 today.  Reports headache but no loss of consciousness.  UDS positive for amphetamines and THC, no amphetamines on medication list. CT head and MRI brain unremarkable.  CTA unremarkable with no LVO.  EKG unremarkable.  UA negative.  EtOH negative. TSH 0.319 but normal free T4.  CMP unremarkable.  Mild leukocytosis at 10.6 with left shift. SPEP studies from last admission negative. Continue to monitor for signs of infections or opiate withdrawal symptoms. - Admitted to FMTS, Dr. Lum Babe attending - Neurology following, appreciate recommendations - MRI lumbar spine to assess for L-spine pathology - N.p.o. - Telemetry x 48 hours - RPR, respiratory viral panel, B1, B12, ammonia - Blood cultures - Avoid CNS acting medications - PT, OT, SLP eval and treat - Neurochecks every 4 hours - Fall precautions - Vitals per floor - AM CBC, BMP

## 2023-01-29 NOTE — Evaluation (Signed)
Occupational Therapy Evaluation Patient Details Name: Matthew Armstrong. MRN: [id number] DOB: May 13, 1980 Today's Date: 01/29/2023   History of Present Illness 42 Y/O M with PMHx of , PAF, TIA/CVA, MDD, and Bladder dysfunction presented 11/13 with left-sided numbness and weakness as well as difficulty with his speech. Similar presentation in the past 3 weeks prior.  MR Brain:No acute intracranial process. No evidence of acute or subacute  infarct.   Clinical Impression   Patient admitted for the diagnosis above.  PTA he lives at home with his spouse and MIL.  Patient stating he used a 4WRW in/out of the home for mobility, but wasn't using it this time.  In addition, he is Mod I for bathing and dressing, and will participate with iADL.  Currently he is presenting with inconsistent symptoms.  He was unable to participate with any MMT on the L upper and lower extremity, as he stated both were flaccid and numb, but the patient clearly lifted his L arm during bed mobility to prevent the IV from pulling.  In addition, he was able to actively assist moving his left leg off the bed.   Patient claims no sensation to L arm and leg.  Currently with inconsistent symptoms he is needing Mod A for bed mobility, Mod A for lower body ADL seated and Mod A for squat pivot transfers.  OT will follow in the acute setting to address deficits, and Patient will benefit from continued inpatient follow up therapy, <3 hours/day.        If plan is discharge home, recommend the following: Assist for transportation;Assistance with cooking/housework;A lot of help with bathing/dressing/bathroom;A lot of help with walking and/or transfers;Help with stairs or ramp for entrance    Functional Status Assessment  Patient has had a recent decline in their functional status and demonstrates the ability to make significant improvements in function in a reasonable and predictable amount of time.  Equipment Recommendations   BSC/3in1;Tub/shower bench    Recommendations for Other Services       Precautions / Restrictions Precautions Precautions: Fall Restrictions Weight Bearing Restrictions: No      Mobility Bed Mobility Overal bed mobility: Needs Assistance Bed Mobility: Supine to Sit     Supine to sit: Mod assist     General bed mobility comments: unable to move LLE, needing assist to scoot to the edge and elevate trunk    Transfers Overall transfer level: Needs assistance   Transfers: Bed to chair/wheelchair/BSC     Squat pivot transfers: Mod assist       General transfer comment: strong side      Balance Overall balance assessment: Needs assistance Sitting-balance support: Feet supported, Single extremity supported Sitting balance-Leahy Scale: Fair   Postural control: Left lateral lean Standing balance support: Bilateral upper extremity supported Standing balance-Leahy Scale: Poor                             ADL either performed or assessed with clinical judgement   ADL Overall ADL's : Needs assistance/impaired Eating/Feeding: Independent;Sitting   Grooming: Set up;Sitting   Upper Body Bathing: Sitting;Minimal assistance   Lower Body Bathing: Moderate assistance;Sitting/lateral leans   Upper Body Dressing : Minimal assistance;Sitting   Lower Body Dressing: Moderate assistance;Sitting/lateral leans   Toilet Transfer: Moderate assistance;Squat-pivot                   Vision Patient Visual Report: No change from baseline  Perception Perception: Within Functional Limits       Praxis Praxis: WFL       Pertinent Vitals/Pain Pain Assessment Pain Assessment: Faces Faces Pain Scale: Hurts even more Pain Location: low back - chronic Pain Descriptors / Indicators: Sharp, Shooting Pain Intervention(s): Monitored during session     Extremity/Trunk Assessment Upper Extremity Assessment Upper Extremity Assessment: Right hand dominant;RUE  deficits/detail;LUE deficits/detail RUE Deficits / Details: WNL LUE Deficits / Details: Patient stating no AROM and LUE is numb.  Inconsistent with testing - unable to participate with MMT due to no AROM, then assisting with transfer by lifting L UE to avoid IV from pulling. LUE Sensation: decreased light touch;decreased proprioception LUE Coordination: decreased fine motor;decreased gross motor   Lower Extremity Assessment Lower Extremity Assessment: Defer to PT evaluation   Cervical / Trunk Assessment Cervical / Trunk Assessment: Kyphotic   Communication Communication Communication: No apparent difficulties   Cognition Arousal: Alert Behavior During Therapy: Flat affect Overall Cognitive Status: Within Functional Limits for tasks assessed                                       General Comments   VSS on RA    Exercises     Shoulder Instructions      Home Living Family/patient expects to be discharged to:: Private residence Living Arrangements: Spouse/significant other;Children Available Help at Discharge: Family;Available 24 hours/day Type of Home: Mobile home Home Access: Stairs to enter Entrance Stairs-Number of Steps: 4-5 Entrance Stairs-Rails: Left;Right Home Layout: One level     Bathroom Shower/Tub: Chief Strategy Officer: Standard     Home Equipment: None          Prior Functioning/Environment Prior Level of Function : Independent/Modified Independent;History of Falls (last six months)             Mobility Comments: reports ind, with multiple falls from Lt side weakness.  Stating he uses a 4WRW ADLs Comments: Mod I for ADL, does not drive        OT Problem List: Decreased strength;Decreased range of motion;Impaired balance (sitting and/or standing);Impaired sensation;Impaired UE functional use;Pain      OT Treatment/Interventions: Therapeutic activities;Self-care/ADL training;Patient/family education;Balance  training;DME and/or AE instruction    OT Goals(Current goals can be found in the care plan section) Acute Rehab OT Goals Patient Stated Goal: Requesting rehab OT Goal Formulation: With patient Time For Goal Achievement: 02/12/23 Potential to Achieve Goals: Good ADL Goals Pt Will Perform Upper Body Dressing: sitting;with set-up Pt Will Perform Lower Body Dressing: with contact guard assist;sitting/lateral leans Pt Will Transfer to Toilet: with supervision;squat pivot transfer;bedside commode  OT Frequency: Min 1X/week    Co-evaluation              AM-PAC OT "6 Clicks" Daily Activity     Outcome Measure Help from another person eating meals?: None Help from another person taking care of personal grooming?: A Little Help from another person toileting, which includes using toliet, bedpan, or urinal?: A Lot Help from another person bathing (including washing, rinsing, drying)?: A Lot Help from another person to put on and taking off regular upper body clothing?: A Little Help from another person to put on and taking off regular lower body clothing?: A Lot 6 Click Score: 16   End of Session Nurse Communication: Other (comment) (no sensor alarms on the unit)  Activity Tolerance: Patient tolerated  treatment well Patient left: in chair;with call bell/phone within reach  OT Visit Diagnosis: Unsteadiness on feet (R26.81);Muscle weakness (generalized) (M62.81);History of falling (Z91.81);Hemiplegia and hemiparesis Hemiplegia - Right/Left: Left Hemiplegia - dominant/non-dominant: Non-Dominant Hemiplegia - caused by: Unspecified (Scans are clear)                Time: 3086-5784 OT Time Calculation (min): 22 min Charges:  OT General Charges $OT Visit: 1 Visit OT Evaluation $OT Eval Moderate Complexity: 1 Mod  01/29/2023  RP, OTR/L  Acute Rehabilitation Services  Office:  [phone]   Suzanna Obey 01/29/2023, 12:13 PM
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
## 2023-01-29 NOTE — Progress Notes (Addendum)
Pt back to baseline and also being admitted as per RN. EEG to be done after admission in AM

## 2023-01-29 NOTE — ED Notes (Signed)
ED TO INPATIENT HANDOFF REPORT  ED Nurse Name and Phone #: [phone]  S Name/Age/Gender Matthew Armstrong. 42 y.o. male Room/Bed: 002C/002C  Code Status   Code Status: Full Code  Home/SNF/Other Home Patient oriented to: self, place, time, and situation Is this baseline? Yes   Triage Complete: Triage complete  Chief Complaint AMS (altered mental status) [R41.82]  Triage Note [facility] reports pt is coming from a parking lot. Pt called his wife and told her something was wrong, wife then called 911. Upon EMS arrival pt with left side flaccid, left facial droop, dysarthria. Wife states pt was last seen normal at 1400. Pt w/previous hx of strokes. Pt states he is having lower back pain for the past few days and getting worse.   Allergies Allergies  Allergen Reactions   Acetaminophen Nausea Only   Ciprofloxacin    Clindamycin/Lincomycin    Ibuprofen    Naproxen    Nsaids    Tetracyclines & Related     Level of Care/Admitting Diagnosis ED Disposition     ED Disposition  Admit   Condition  --   Comment  Hospital Area: [facility] [[id number]]  Level of Care: Telemetry Medical [[id number]]  May admit patient to [name] or [facility] if equivalent level of care is available:: No  Covid Evaluation: Asymptomatic - no recent exposure (last 10 days) testing not required  Diagnosis: AMS (altered mental status) [[id number]]  Admitting Physician: Doreene Eland [[id number]]  Attending Physician: Doreene Eland [[id number]]  Certification:: I certify this patient will need inpatient services for at least 2 midnights  Expected Medical Readiness: 01/30/2023          B Medical/Surgery History Past Medical History:  Diagnosis Date   Bladder disorder    Hypokalemia 01/04/2023   Past Surgical History:  Procedure Laterality Date   BACK SURGERY       A IV Location/Drains/Wounds Patient Lines/Drains/Airways Status     Active Line/Drains/Airways     Name Placement date  Placement time Site Days   Peripheral IV 01/28/23 18 G 1" Anterior;Distal;Right;Upper Arm 01/28/23  1610  Arm  1   Peripheral IV 01/28/23 18 G 1" Anterior;Distal;Left;Upper Arm 01/28/23  1610  Arm  1            Intake/Output Last 24 hours No intake or output data in the 24 hours ending 01/29/23 7846  Labs/Imaging Results for orders placed or performed during the hospital encounter of 01/28/23 (from the past 48 hour(s))  Ethanol     Status: None   Collection Time: 01/28/23  4:19 PM  Result Value Ref Range   Alcohol, Ethyl (B) <10 <10 mg/dL    Comment: (NOTE) Lowest detectable limit for serum alcohol is 10 mg/dL.  For medical purposes only. Performed at [facility], [address], [place], [place] [address]   Protime-INR     Status: None   Collection Time: 01/28/23  4:19 PM  Result Value Ref Range   Prothrombin Time 14.2 11.4 - 15.2 seconds   INR 1.1 0.8 - 1.2    Comment: (NOTE) INR goal varies based on device and disease states. Performed at [facility], [address] [place], [place] [address]   APTT     Status: None   Collection Time: 01/28/23  4:19 PM  Result Value Ref Range   aPTT 30 24 - 36 seconds    Comment: Performed at [facility], [address], [place], [place] [address]  CBC     Status: Abnormal   Collection Time: 01/28/23  4:19 PM  Result Value Ref Range   WBC 10.6 (H) 4.0 - 10.5 K/uL   RBC 4.00 (L) 4.22 - 5.81 MIL/uL   Hemoglobin 12.2 (L) 13.0 - 17.0 g/dL   HCT 13.2 (L) 44.0 - 10.2 %   MCV 92.0 80.0 - 100.0 fL   MCH 30.5 26.0 - 34.0 pg   MCHC 33.2 30.0 - 36.0 g/dL   RDW 72.5 36.6 - 44.0 %   Platelets 343 150 - 400 K/uL   nRBC 0.0 0.0 - 0.2 %    Comment: Performed at [facility], [address] [place], [place] [address]  Differential     Status: Abnormal   Collection Time: 01/28/23  4:19 PM  Result Value Ref Range   Neutrophils Relative % 85 %   Neutro Abs 9.1 (H) 1.7 - 7.7 K/uL   Lymphocytes Relative 12 %    Lymphs Abs 1.3 0.7 - 4.0 K/uL   Monocytes Relative 2 %   Monocytes Absolute 0.2 0.1 - 1.0 K/uL   Eosinophils Relative 0 %   Eosinophils Absolute 0.0 0.0 - 0.5 K/uL   Basophils Relative 0 %   Basophils Absolute 0.0 0.0 - 0.1 K/uL   Immature Granulocytes 1 %   Abs Immature Granulocytes 0.05 0.00 - 0.07 K/uL    Comment: Performed at [facility], [address] [place], [place] [address]  Comprehensive metabolic panel     Status: Abnormal   Collection Time: 01/28/23  4:19 PM  Result Value Ref Range   Sodium 137 135 - 145 mmol/L   Potassium 4.2 3.5 - 5.1 mmol/L   Chloride 104 98 - 111 mmol/L   CO2 23 22 - 32 mmol/L   Glucose, Bld 156 (H) 70 - 99 mg/dL    Comment: Glucose reference range applies only to samples taken after fasting for at least 8 hours.   BUN 16 6 - 20 mg/dL   Creatinine, Ser 8.75 0.61 - 1.24 mg/dL   Calcium 9.2 8.9 - 64.3 mg/dL   Total Protein 7.1 6.5 - 8.1 g/dL   Albumin 3.4 (L) 3.5 - 5.0 g/dL   AST 13 (L) 15 - 41 U/L   ALT 15 0 - 44 U/L   Alkaline Phosphatase 85 38 - 126 U/L   Total Bilirubin 0.5 <1.2 mg/dL   GFR, Estimated >32 >95 mL/min    Comment: (NOTE) Calculated using the CKD-EPI Creatinine Equation (2021)    Anion gap 10 5 - 15    Comment: Performed at [facility], [address] [place], [place] [address]  Urine rapid drug screen (hosp performed)     Status: Abnormal   Collection Time: 01/28/23  4:19 PM  Result Value Ref Range   Opiates NONE DETECTED NONE DETECTED   Cocaine NONE DETECTED NONE DETECTED   Benzodiazepines NONE DETECTED NONE DETECTED   Amphetamines POSITIVE (A) NONE DETECTED   Tetrahydrocannabinol POSITIVE (A) NONE DETECTED   Barbiturates NONE DETECTED NONE DETECTED    Comment: (NOTE) DRUG SCREEN FOR MEDICAL PURPOSES ONLY.  IF CONFIRMATION IS NEEDED FOR ANY PURPOSE, NOTIFY LAB WITHIN 5 DAYS.  LOWEST DETECTABLE LIMITS FOR URINE DRUG SCREEN Drug Class                     Cutoff (ng/mL) Amphetamine and metabolites     1000 Barbiturate and metabolites  200 Benzodiazepine                 200 Opiates and metabolites        300 Cocaine and metabolites        300 THC                            50 Performed at [facility], [address], [place], [place] [address]   I-STAT, [place] 8     Status: Abnormal   Collection Time: 01/28/23  4:27 PM  Result Value Ref Range   Sodium 138 135 - 145 mmol/L   Potassium 4.2 3.5 - 5.1 mmol/L   Chloride 104 98 - 111 mmol/L   BUN 17 6 - 20 mg/dL   Creatinine, Ser 6.04 0.61 - 1.24 mg/dL   Glucose, Bld 540 (H) 70 - 99 mg/dL    Comment: Glucose reference range applies only to samples taken after fasting for at least 8 hours.   Calcium, Ion 1.13 (L) 1.15 - 1.40 mmol/L   TCO2 25 22 - 32 mmol/L   Hemoglobin 11.9 (L) 13.0 - 17.0 g/dL   HCT 98.1 (L) 19.1 - 47.8 %  TSH     Status: Abnormal   Collection Time: 01/28/23  5:47 PM  Result Value Ref Range   TSH 0.319 (L) 0.350 - 4.500 uIU/mL    Comment: Performed by a 3rd Generation assay with a functional sensitivity of <=0.01 uIU/mL. Performed at [facility], [address] [place], [place] [address]   T4, free     Status: None   Collection Time: 01/28/23  5:47 PM  Result Value Ref Range   Free T4 1.08 0.61 - 1.12 ng/dL    Comment: (NOTE) Biotin ingestion may interfere with free T4 tests. If the results are inconsistent with the TSH level, previous test results, or the clinical presentation, then consider biotin interference. If needed, order repeat testing after stopping biotin. Performed at [facility], [address], [place], [place] [address]   Urinalysis, Routine w reflex microscopic -Urine, Clean Catch     Status: Abnormal   Collection Time: 01/28/23  5:50 PM  Result Value Ref Range   Color, Urine YELLOW YELLOW   APPearance CLEAR CLEAR   Specific Gravity, Urine 1.033 (H) 1.005 - 1.030   pH 6.0 5.0 - 8.0   Glucose, UA NEGATIVE NEGATIVE mg/dL   Hgb urine dipstick NEGATIVE NEGATIVE    Bilirubin Urine NEGATIVE NEGATIVE   Ketones, ur NEGATIVE NEGATIVE mg/dL   Protein, ur NEGATIVE NEGATIVE mg/dL   Nitrite NEGATIVE NEGATIVE   Leukocytes,Ua NEGATIVE NEGATIVE    Comment: Performed at [facility], [address], [place], [place] [address]  Ethanol     Status: None   Collection Time: 01/28/23  8:15 PM  Result Value Ref Range   Alcohol, Ethyl (B) <10 <10 mg/dL    Comment: (NOTE) Lowest detectable limit for serum alcohol is 10 mg/dL.  For medical purposes only. Performed at [facility], [address] [place], [place] [address]   Ammonia     Status: None   Collection Time: 01/28/23  8:15 PM  Result Value Ref Range   Ammonia 27 9 - 35 umol/L    Comment: Performed at [facility], [address] [place], [place] [address]  Vitamin B12     Status: None   Collection Time: 01/28/23  8:15 PM  Result Value Ref Range   Vitamin B-12 210 180 - 914 pg/mL    Comment: (NOTE) This assay is not validated for testing neonatal or myeloproliferative syndrome specimens for Vitamin B12 levels. Performed at [facility], [address], [place], [place] [address]   SARS Coronavirus 2 by RT PCR (hospital order, performed in [facility] hospital lab) *cepheid single result test* Anterior Nasal Swab     Status: None   Collection Time: 01/28/23  8:22 PM   Specimen: Anterior Nasal Swab  Result Value Ref Range   SARS Coronavirus 2 by RT PCR NEGATIVE NEGATIVE    Comment: Performed at [facility], [address] [place], [place] [address]  Respiratory (~20 pathogens) panel by PCR     Status: None   Collection Time: 01/28/23  8:22 PM   Specimen: Anterior Nasal Swab; Respiratory  Result Value Ref Range   Adenovirus NOT DETECTED NOT DETECTED   Coronavirus 229E NOT DETECTED NOT DETECTED    Comment: (NOTE) The Coronavirus on the Respiratory Panel, DOES NOT test for the novel  Coronavirus (2019 nCoV)    Coronavirus HKU1 NOT DETECTED NOT DETECTED    Coronavirus NL63 NOT DETECTED NOT DETECTED   Coronavirus OC43 NOT DETECTED NOT DETECTED   Metapneumovirus NOT DETECTED NOT DETECTED   Rhinovirus / Enterovirus NOT DETECTED NOT DETECTED   Influenza A NOT DETECTED NOT DETECTED   Influenza B NOT DETECTED NOT DETECTED   Parainfluenza Virus 1 NOT DETECTED NOT DETECTED   Parainfluenza Virus 2 NOT DETECTED NOT DETECTED   Parainfluenza Virus 3 NOT DETECTED NOT DETECTED   Parainfluenza Virus 4 NOT DETECTED NOT DETECTED   Respiratory Syncytial Virus NOT DETECTED NOT DETECTED   Bordetella pertussis NOT DETECTED NOT DETECTED   Bordetella Parapertussis NOT DETECTED NOT DETECTED   Chlamydophila pneumoniae NOT DETECTED NOT DETECTED   Mycoplasma pneumoniae NOT DETECTED NOT DETECTED    Comment: Performed at [facility], [address], [place], [place] [address]  Comprehensive metabolic panel     Status: Abnormal   Collection Time: 01/29/23  5:22 AM  Result Value Ref Range   Sodium 135 135 - 145 mmol/L   Potassium 3.7 3.5 - 5.1 mmol/L   Chloride 101 98 - 111 mmol/L   CO2 28 22 - 32 mmol/L   Glucose, Bld 99 70 - 99 mg/dL    Comment: Glucose reference range applies only to samples taken after fasting for at least 8 hours.   BUN 14 6 - 20 mg/dL   Creatinine, Ser 2.35 (H) 0.61 - 1.24 mg/dL   Calcium 8.7 (L) 8.9 - 10.3 mg/dL   Total Protein 6.7 6.5 - 8.1 g/dL   Albumin 3.0 (L) 3.5 - 5.0 g/dL   AST 12 (L) 15 - 41 U/L   ALT 13 0 - 44 U/L   Alkaline Phosphatase 83 38 - 126 U/L   Total Bilirubin 0.4 <1.2 mg/dL   GFR, Estimated >57 >32 mL/min    Comment: (NOTE) Calculated using the CKD-EPI Creatinine Equation (2021)    Anion gap 6 5 - 15    Comment: Performed at [facility], [address] [place], [place] [address]  CBC     Status: Abnormal   Collection Time: 01/29/23  5:22 AM  Result Value Ref Range   WBC 10.8 (H) 4.0 - 10.5 K/uL   RBC 3.84 (L) 4.22 - 5.81 MIL/uL   Hemoglobin 11.6 (L) 13.0 - 17.0 g/dL  HCT 35.5 (L) 39.0 - 52.0 %    MCV 92.4 80.0 - 100.0 fL   MCH 30.2 26.0 - 34.0 pg   MCHC 32.7 30.0 - 36.0 g/dL   RDW 47.8 29.5 - 62.1 %   Platelets 346 150 - 400 K/uL   nRBC 0.0 0.0 - 0.2 %    Comment: Performed at [facility], [address], [place], [place] [address]   MR LUMBAR SPINE WO CONTRAST  Result Date: 01/28/2023 CLINICAL DATA:  Initial evaluation for low back pain. EXAM: MRI LUMBAR SPINE WITHOUT CONTRAST TECHNIQUE: Multiplanar, multisequence MR imaging of the lumbar spine was performed. No intravenous contrast was administered. COMPARISON:  Prior radiograph from 01/20/2005. FINDINGS: Segmentation: Standard. Lowest well-formed disc space labeled the L5-S1 level. Alignment: Physiologic with preservation of the normal lumbar lordosis. No listhesis. Vertebrae: Vertebral body height maintained without acute or chronic fracture. Bone marrow signal intensity within normal limits. No worrisome osseous lesions or abnormal marrow edema. Conus medullaris and cauda equina: Conus extends to the L1-2 level. Conus and cauda equina appear normal. Paraspinal and other soft tissues: Unremarkable. Disc levels: L1-2:  Unremarkable. L2-3: Disc desiccation. Small central disc extrusion with annular fissure and superior migration indents the ventral thecal sac. No spinal stenosis. Foramina remain patent. L3-4: Small left foraminal disc protrusion with annular fissure (series 3, image 11). Mild bilateral facet spurring. No spinal stenosis. Mild left L3 foraminal narrowing. Right neural foramen remains patent. L4-5: Disc desiccation with mild disc bulge. Associated central to left paracentral annular fissure. Minimal facet spurring. No spinal stenosis. Foramina remain patent. L5-S1: Small right subarticular to foraminal disc protrusion with annular fissure (series 6, image 42). No canal or lateral recess stenosis. Mild right L5 foraminal stenosis. Left neural foramina remains patent. IMPRESSION: 1. Small right subarticular to foraminal  disc protrusion at L5-S1 with resultant mild right L5 foraminal stenosis. 2. Small left foraminal disc protrusion at L3-4 with resultant mild left L3 foraminal stenosis. 3. Small central disc extrusion at L2-3 without stenosis or impingement. Electronically Signed   By: Rise Mu M.D.   On: 01/28/2023 22:19   MR BRAIN WO CONTRAST  Result Date: 01/28/2023 CLINICAL DATA:  Left-sided weakness, stroke suspected EXAM: MRI HEAD WITHOUT CONTRAST TECHNIQUE: Multiplanar, multiecho pulse sequences of the brain and surrounding structures were obtained without intravenous contrast. COMPARISON:  01/04/2023 MRI head FINDINGS: Brain: No restricted diffusion to suggest acute or subacute infarct. No acute hemorrhage, mass, mass effect, or midline shift. No hydrocephalus or extra-axial collection. Pituitary and craniocervical junction within normal limits. No hemosiderin deposition to suggest remote hemorrhage. Normal cerebral volume for age. No abnormal T2 hyperintense signal in the periventricular, juxtacortical, or infratentorial white matter. Vascular: Normal arterial flow voids. Skull and upper cervical spine: Normal marrow signal. Sinuses/Orbits: Left maxillary mucous retention cyst. No acute finding in the orbits. Other: Trace fluid in the mastoid air cells. IMPRESSION: No acute intracranial process. No evidence of acute or subacute infarct. Electronically Signed   By: Wiliam Ke M.D.   On: 01/28/2023 19:59   CT ANGIO HEAD NECK W WO CM W PERF (CODE STROKE)  Result Date: 01/28/2023 CLINICAL DATA:  Neuro deficit, acute, stroke suspected. Left-sided deficit and left-sided weakness. EXAM: CT ANGIOGRAPHY HEAD AND NECK CT PERFUSION BRAIN TECHNIQUE: Multidetector CT imaging of the head and neck was performed using the standard protocol during bolus administration of intravenous contrast. Multiplanar CT image reconstructions and MIPs were obtained to evaluate the vascular anatomy. Carotid stenosis measurements  (when  applicable) are obtained utilizing NASCET criteria, using the distal internal carotid diameter as the denominator. Multiphase CT imaging of the brain was performed following IV bolus contrast injection. Subsequent parametric perfusion maps were calculated using RAPID software. RADIATION DOSE REDUCTION: This exam was performed according to the departmental dose-optimization program which includes automated exposure control, adjustment of the mA and/or kV according to patient size and/or use of iterative reconstruction technique. CONTRAST:  OMNIPAQUE IOHEXOL 350 MG/ML SOLN COMPARISON:  Head CT 01/28/2023. FINDINGS: CTA NECK FINDINGS Aortic arch: Three-vessel arch configuration. Arch vessel origins are patent. Right carotid system: No evidence of dissection, stenosis (50% or greater) or occlusion. Left carotid system: No evidence of dissection, stenosis (50% or greater) or occlusion. Vertebral arteries: Codominant. No evidence of dissection, stenosis (50% or greater) or occlusion. Skeleton: Unremarkable. Other neck: Unremarkable. Upper chest: Unremarkable. Review of the MIP images confirms the above findings CTA HEAD FINDINGS Anterior circulation: Intracranial ICAs are patent without stenosis or aneurysm. The proximal ACAs and MCAs are patent without stenosis or aneurysm. Distal branches are symmetric. Posterior circulation: Normal basilar artery. The SCAs, AICAs and PICAs are patent proximally. The PCAs are patent proximally without stenosis or aneurysm. Distal branches are symmetric. Venous sinuses: As permitted by contrast timing, patent. 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 Review of the MIP images confirms the above findings CT Brain Perfusion Findings: ASPECTS: 10 CBF (<30%) Volume: 0mL Perfusion (Tmax>6.0s) volume: 0mL Mismatch Volume: 0mL Infarction Location:Not applicable. IMPRESSION: 1. No large vessel occlusion or hemodynamically  significant stenosis in the head or neck. 2. No evidence of core infarct or ischemic penumbra on CT perfusion. Code stroke imaging results were communicated on 01/28/2023 at 4:44 pm to provider Dr. Otelia Limes via secure text paging. Electronically Signed   By: Orvan Falconer M.D.   On: 01/28/2023 16:48   CT HEAD CODE STROKE WO CONTRAST  Result Date: 01/28/2023 CLINICAL DATA:  Code stroke. Neuro deficit, acute, stroke suspected. Left-sided weakness. EXAM: CT HEAD WITHOUT CONTRAST TECHNIQUE: Contiguous axial images were obtained from the base of the skull through the vertex without intravenous contrast. RADIATION DOSE REDUCTION: This exam was performed according to the departmental dose-optimization program which includes automated exposure control, adjustment of the mA and/or kV according to patient size and/or use of iterative reconstruction technique. COMPARISON:  MRI 01/04/2023 FINDINGS: Brain: Normal appearance without evidence of malformation, old or acute infarction, mass lesion, hemorrhage, hydrocephalus or extra-axial collection. Vascular: No abnormal vascular finding. Skull: Normal Sinuses/Orbits: Clear except for a retention cyst in the left maxillary sinus. Orbits negative. Other: None ASPECTS (Alberta Stroke Program Early CT Score) - Ganglionic level infarction (caudate, lentiform nuclei, internal capsule, insula, M1-M3 cortex): 7 - Supraganglionic infarction (M4-M6 cortex): 3 Total score (0-10 with 10 being normal): 10 IMPRESSION: 1. Normal head CT. 2. Aspects is 10. 3. These results were communicated to Dr. Otelia Limes at 4:30 pm on 01/28/2023 by text page via the [facility] messaging system. Electronically Signed   By: Paulina Fusi M.D.   On: 01/28/2023 16:31    Pending Labs Unresulted Labs (From admission, onward)     Start     Ordered   01/28/23 1859  Vitamin B1  Once,   R        01/28/23 1903   01/28/23 1858  Culture, blood (Routine X 2) w Reflex to ID Panel  BLOOD CULTURE X 2,   R (with TIMED  occurrences)      01/28/23 1903  01/28/23 1858  RPR  Once,   R        01/28/23 1903            Vitals/Pain Today's Vitals   01/29/23 0349 01/29/23 0530 01/29/23 0600 01/29/23 0630  BP:  (!) 94/55 94/62 102/61  Pulse: 80 71 79 86  Resp: 16 15 15 17   Temp: 98.5 F (36.9 C)     TempSrc: Oral     SpO2: 100% 95% 92% 93%  PainSc:        Isolation Precautions No active isolations  Medications Medications  enoxaparin (LOVENOX) injection 40 mg (40 mg Subcutaneous Given 01/29/23 0036)  lactated ringers infusion (has no administration in time range)  iohexol (OMNIPAQUE) 350 MG/ML injection 100 mL (100 mLs Intravenous Contrast Given 01/28/23 1631)    Mobility walks     Focused Assessments Neuro Assessment Handoff:  Swallow screen pass? Yes    NIH Stroke Scale  Dizziness Present: No Headache Present: No Interval: Other (Comment) (neuro check) Level of Consciousness (1a.)   : Alert, keenly responsive LOC Questions (1b. )   : Answers both questions correctly LOC Commands (1c. )   : Performs both tasks correctly Best Gaze (2. )  : Normal Visual (3. )  : No visual loss Facial Palsy (4. )    : Normal symmetrical movements Motor Arm, Left (5a. )   : Some effort against gravity Motor Arm, Right (5b. ) : No drift Motor Leg, Left (6a. )  : No effort against gravity Motor Leg, Right (6b. ) : No drift Limb Ataxia (7. ): Present in one limb Sensory (8. )  : Normal, no sensory loss Best Language (9. )  : No aphasia Dysarthria (10. ): Normal Extinction/Inattention (11.)   : No Abnormality Complete NIHSS TOTAL: 6 Last date known well: (S) 01/28/23 Last time known well: (S) 1400 Neuro Assessment: Exceptions to WDL Neuro Checks:   Initial (01/28/23 1645)  Has TPA been given? No If patient is a Neuro Trauma and patient is going to OR before floor call report to 4N Charge nurse: [phone] or [phone]   R Recommendations: See Admitting Provider Note  Report given to:    Additional Notes: .
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
## 2023-01-29 NOTE — Assessment & Plan Note (Deleted)
Initially came in with AMS and difficulty speaking.  On my exam, patient was oriented to person, place, time, situation.  He did have some dysarthria, this waxed and waned.  Given his UDS being positive for amphetamines and he has no amphetamines on his medication list, I suspect his altered mental status could be due to drug use prior to arrival.  Also could be in the setting of hypothermia.  His brain imaging was negative for stroke, CTA head and neck was also negative. - Avoid CNS acting medications - Neurochecks every 4 hours - Fall precautions - N.p.o. until patient can pass bedside swallow

## 2023-01-29 NOTE — Evaluation (Signed)
Physical Therapy Evaluation Patient Details Name: Matthew Armstrong. MRN: [id number] DOB: June 08, 1980 Today's Date: 01/29/2023  History of Present Illness  Patient is 42  y.o. male presented 11/13 with left-sided numbness and weakness as well as difficulty with his speech. MRI, CT, CTA negative. Similar presentation in the past 3 weeks prior.  MR Brain:No acute intracranial process. No evidence of acute or subacute  infarct. PMH significant for PAF, TIA/CVA, MDD, and Bladder dysfunction.   Clinical Impression  Matthew Armstrong. is 42 y.o. male admitted with above HPI and diagnosis. Patient is currently limited by functional impairments below (see PT problem list). Patient lives with spouse and is independent at baseline. Patient with slightly inconsistent testing functionally vs isolated for Lt LE. Pt was able to move supine<>sit EOB with Min assist and sit<>stand with min assist. No buckling noted on Lt side with stepping Rt LE but pt limited with Lt LE advancement. Pt inconsistent with Lt UE throughout visit as well. Pt c/o of significant back pain in lower back and when asked how long he has had back pain pt reporting yesterday and stating "I just found out about the slipped discs yesterday". Pt also noted to have slightly slurred speech at start of session but improved throughout when engaged in conversation about family and tattoos. Patient will benefit from continued skilled PT interventions to address impairments and progress independence with mobility. Patient will benefit from continued inpatient follow up therapy, <3 hours/day. Acute PT will follow and progress as able.         If plan is discharge home, recommend the following: A lot of help with walking and/or transfers;A little help with bathing/dressing/bathroom;Assistance with cooking/housework;Assist for transportation;Help with stairs or ramp for entrance   Can travel by private vehicle   No    Equipment Recommendations  (TBD)   Recommendations for Other Services       Functional Status Assessment Patient has had a recent decline in their functional status and demonstrates the ability to make significant improvements in function in a reasonable and predictable amount of time.     Precautions / Restrictions Precautions Precautions: Fall Restrictions Weight Bearing Restrictions: No      Mobility  Bed Mobility Overal bed mobility: Needs Assistance Bed Mobility: Supine to Sit, Sit to Supine     Supine to sit: Min assist, Used rails Sit to supine: Min assist   General bed mobility comments: pt using Rt Ue to assist Lt LE off EOB and pressing through Rt UE on bed rail and Lt UE with elbow. Pt made quick forceful movement pressing up through Lt arm extending elbow to rise. Light min assist to steady balance and sit EOB. pt able to sit with midline posture. EOS pt able to lower to side/supine and assisting Lt Le with Rt UE slighlty.    Transfers Overall transfer level: Needs assistance Equipment used: 1 person hand held assist Transfers: Sit to/from Stand Sit to Stand: Min assist           General transfer comment: Min assist to rise/lower from EOB. Pt using Rt UE and Rt LE to power up with cuing from therapist. completed 3x, pt returning to sit quickly on first attempt c/o back pain. Lt UE held in flexed posture of arm and tight to body. Lt LE maintained knee extension in standing with no manual assistnace from therapist. Pt able to take good lateral step Lt to [facility] initially, 3 more steps taken and Lt foot  dropped with poor activation to flex at hip and DF at ankle.    Ambulation/Gait                  Stairs            Wheelchair Mobility     Tilt Bed    Modified Rankin (Stroke Patients Only)       Balance Overall balance assessment: Needs assistance Sitting-balance support: Feet supported, Single extremity supported Sitting balance-Leahy Scale: Fair   Postural control: Left  lateral lean Standing balance support: Bilateral upper extremity supported Standing balance-Leahy Scale: Poor                               Pertinent Vitals/Pain Pain Assessment Pain Assessment: 0-10 Pain Location: back from my slipped disc "I just found out yesterday" Pain Descriptors / Indicators: Sharp, Shooting Pain Intervention(s): Limited activity within patient's tolerance, Monitored during session, Repositioned    Home Living Family/patient expects to be discharged to:: Private residence Living Arrangements: Spouse/significant other;Children (MIL; and 3 y.o.) Available Help at Discharge: Family;Available 24 hours/day Type of Home: Mobile home Home Access: Stairs to enter Entrance Stairs-Rails: Left;Right;Can reach both Entrance Stairs-Number of Steps: 4-5   Home Layout: One level Home Equipment: Rollator (4 wheels)      Prior Function Prior Level of Function : Independent/Modified Independent;History of Falls (last six months)             Mobility Comments: reports ind, with multiple falls from Lt side weakness.  Stating he uses a 4WRW ADLs Comments: Mod I for ADL, does not drive     Extremity/Trunk Assessment   Upper Extremity Assessment Upper Extremity Assessment: Defer to OT evaluation LUE Deficits / Details: noted pt able to make forceful effort to press up through Lt UE extending at elbow and shoulder to sit upright to EOB.    Lower Extremity Assessment Lower Extremity Assessment: RLE deficits/detail;LLE deficits/detail RLE Deficits / Details: grossly 4/5 throughout, pt giving some inconsistent testing with MMT but when reminded Rt LE is his strong side he improves effort. RLE Sensation: WNL RLE Coordination: WNL LLE Deficits / Details: pt making minimal movements for AROM to Lt LE, using Rt UE to move Lt LE when asked. pt able to extend Lt Le for breif moment at EOB with therapist testing knee ext/flex ROM (resistance noted both directions  but not tone). No clonus on Lt and negative Babinski with pt Dorsiflexing and withdrawing foot/leg slightly to stimulus. pt LLE Sensation: decreased light touch LLE Coordination: decreased gross motor;decreased fine motor    Cervical / Trunk Assessment Cervical / Trunk Assessment: Kyphotic  Communication   Communication Communication: No apparent difficulties Cueing Techniques: Verbal cues  Cognition Arousal: Alert Behavior During Therapy: Flat affect Overall Cognitive Status: Within Functional Limits for tasks assessed                                 General Comments: pt claiming to not know the date or day of week at start, with encouragement as pt had answered several questions appropriately and correctly regarding family and PLOF pt able to state correctly. pt followed all commands/ceus correctly.        General Comments      Exercises     Assessment/Plan    PT Assessment Patient needs continued PT services  PT Problem List Decreased  strength;Decreased range of motion;Decreased activity tolerance;Decreased balance;Decreased mobility;Decreased coordination;Decreased cognition;Decreased knowledge of use of DME;Decreased safety awareness;Decreased knowledge of precautions;Impaired sensation;Impaired tone;Pain       PT Treatment Interventions DME instruction;Neuromuscular re-education;Balance training;Therapeutic exercise;Therapeutic activities;Functional mobility training;Stair training;Gait training;Cognitive remediation;Patient/family education;Wheelchair mobility training;Manual techniques    PT Goals (Current goals can be found in the Care Plan section)  Acute Rehab PT Goals Patient Stated Goal: get better PT Goal Formulation: With patient Time For Goal Achievement: 02/12/23 Potential to Achieve Goals: Good    Frequency Min 1X/week     Co-evaluation               AM-PAC PT "6 Clicks" Mobility  Outcome Measure Help needed turning from your  back to your side while in a flat bed without using bedrails?: A Little Help needed moving from lying on your back to sitting on the side of a flat bed without using bedrails?: A Little         6 Click Score: 6    End of Session Equipment Utilized During Treatment: Gait belt Activity Tolerance: Patient tolerated treatment well Patient left: in bed;with call bell/phone within reach;with bed alarm set Nurse Communication: Mobility status PT Visit Diagnosis: Muscle weakness (generalized) (M62.81);Difficulty in walking, not elsewhere classified (R26.2);Hemiplegia and hemiparesis;Other symptoms and signs involving the nervous system (R29.898);Unsteadiness on feet (R26.81);Other abnormalities of gait and mobility (R26.89) Hemiplegia - Right/Left: Left Hemiplegia - dominant/non-dominant: Non-dominant Hemiplegia - caused by: Unspecified    Time: 6578-4696 PT Time Calculation (min) (ACUTE ONLY): 25 min   Charges:   PT Evaluation $PT Eval Moderate Complexity: 1 Mod PT Treatments $Therapeutic Activity: 8-22 mins PT General Charges $$ ACUTE PT VISIT: 1 Visit         Wynn Maudlin, DPT Acute Rehabilitation Services Office [phone]  01/29/23 5:34 PM

## 2023-01-29 NOTE — Assessment & Plan Note (Deleted)
Amphetamines on UDS.  MRI, CT, CTA negative. - Admitted to FMTS, Dr. Lum Babe attending - Neurology following, appreciate recommendations - MRI lumbar spine to assess for L-spine pathology - transitioned to normal diet - Telemetry x 48 hours - RPR, respiratory viral panel, B1, B12, ammonia - Blood cultures - Avoid CNS acting medications - PT, OT, SLP eval and treat - Neurochecks every 4 hours - Fall precautions - Vitals per floor - AM CBC, BMP

## 2023-01-29 NOTE — Progress Notes (Signed)
OT Cancellation Note  Patient Details Name: Matthew Armstrong. MRN: [id number] DOB: Apr 26, 1980   Cancelled Treatment:    Reason Eval/Treat Not Completed: Other (comment).  Currently getting moved upstairs, will follow up later.  Jerney Baksh D Radley Teston 01/29/2023, 8:55 AM 01/29/2023  RP, OTR/L  Acute Rehabilitation Services  Office:  [phone]

## 2023-01-29 NOTE — Assessment & Plan Note (Signed)
Temperature 94.5 on admission, improved to 97.6 with Bair hugger.  Suspect hypothermia is likely environmental. -Continue routine vitals -Viral respiratory pathogen panel ordered

## 2023-01-29 NOTE — Assessment & Plan Note (Addendum)
This morning patient is alert and oriented to person place and time.  Attention intact but short-term memory is impaired.Amphetamines on UDS.  MRI, CT, CTA negative.  Neurology recommended against doing EEG in secure chat yesterday.  Blood cultures showed no growth and no sources of infection.  UDS positive for amphetamines which patient denies using.  -- Follow-up neuro recommendations - Avoid CNS acting medications - Neurochecks every 4 hours - Fall precautions - Transition to normal diet

## 2023-01-29 NOTE — Plan of Care (Signed)

## 2023-01-29 NOTE — Assessment & Plan Note (Addendum)
Continue left-sided weakness.  Additionally left-sided sensation changes.  Positive Hoover sign.  Neurology does not think these are consistent with biologic neurologic cause and are more consistent with functional neurologic disorder. - Neurology following, appreciate recommendations --Establish follow-up for outpatient psychiatry - transitioned to normal diet - Telemetry x 48 hours - PT, OT, SLP eval and treat - Neurochecks every 4 hours - Fall precautions - Vitals per floor - AM CBC, BMP

## 2023-01-29 NOTE — Assessment & Plan Note (Addendum)
Resolved.  Etiology unclear but may have been substance-induced

## 2023-01-29 NOTE — ED Notes (Signed)
Patient more awake at this time.  Admit MD at bedside.  Swallow screen repeated.

## 2023-01-29 NOTE — Progress Notes (Signed)
SLP Cancellation Note  Patient Details Name: Matthew Armstrong. MRN: [id number] DOB: 1980-04-18   Cancelled treatment:       Reason Eval/Treat Not Completed: Other (comment). MRI negative for acute finding, no specific SLP needs at this time. Will defer evaluation   Bruno Leach, Riley Nearing 01/29/2023, 2:10 PM

## 2023-01-30 DIAGNOSIS — R4182 Altered mental status, unspecified: Secondary | ICD-10-CM | POA: Diagnosis not present

## 2023-01-30 DIAGNOSIS — R531 Weakness: Secondary | ICD-10-CM | POA: Diagnosis not present

## 2023-01-30 LAB — BASIC METABOLIC PANEL
Anion gap: 9 (ref 5–15)
BUN: 13 mg/dL (ref 6–20)
CO2: 24 mmol/L (ref 22–32)
Calcium: 8.5 mg/dL — ABNORMAL LOW (ref 8.9–10.3)
Chloride: 106 mmol/L (ref 98–111)
Creatinine, Ser: 0.66 mg/dL (ref 0.61–1.24)
GFR, Estimated: >60 mL/min (ref >60)
Glucose, Bld: 106 mg/dL — ABNORMAL HIGH (ref 70–99)
Potassium: 3.8 mmol/L (ref 3.5–5.1)
Sodium: 139 mmol/L (ref 135–145)

## 2023-01-30 NOTE — Assessment & Plan Note (Signed)
 This morning patient is alert and oriented to person place and time.  Attention intact but short-term memory is impaired.Amphetamines on UDS.  MRI, CT, CTA negative.  Neurology recommended against doing EEG in secure chat yesterday.  Blood cultures showed no growth and no sources of infection.  UDS positive for amphetamines which patient denies using.  -- Follow-up neuro recommendations - Avoid CNS acting medications - Neurochecks every 4 hours - Fall precautions - Transition to normal diet

## 2023-01-30 NOTE — TOC Transition Note (Signed)
Transition of Care [facility]) - CM/SW Discharge Note   Patient Details  Name: Matthew Armstrong. MRN: [id number] Date of Birth: 1980/04/30  Transition of Care [facility]) CM/SW Contact:  Kermit Balo, RN Phone Number: 01/30/2023, 10:54 AM   Clinical Narrative:     Pt is discharging home with outpatient therapy referral sent to [facility]. Pt refused SNF rehab.  Pt has PCP appt on AVS.  Pt has transportation home.  Final next level of care: OP Rehab Barriers to Discharge: No Barriers Identified   Patient Goals and CMS Choice      Discharge Placement                         Discharge Plan and Services Additional resources added to the After Visit Summary for                                       Social Determinants of Health (SDOH) Interventions SDOH Screenings   Food Insecurity: No Food Insecurity (01/28/2023)  Housing: Low Risk  (01/28/2023)  Transportation Needs: No Transportation Needs (01/28/2023)  Utilities: Not At Risk (01/28/2023)  Financial Resource Strain: Low Risk  (09/14/2022)   Received from [facility]  Social Connections: Unknown (09/05/2022)   Received from [facility]  Tobacco Use: High Risk (01/28/2023)     Readmission Risk Interventions     No data to display

## 2023-01-30 NOTE — Assessment & Plan Note (Signed)
Continue left-sided weakness.  Additionally left-sided sensation changes.  Positive Hoover sign.  Neurology does not think these are consistent with biologic neurologic cause and are more consistent with functional neurologic disorder.  PT OT recommending SNF.  TOC consulted and pending - Neurology following, appreciate recommendations --Establish follow-up for outpatient psychiatry - Telemetry x 48 hours - Neurochecks every 4 hours - Fall precautions - Vitals per floor - AM CBC, BMP

## 2023-01-30 NOTE — Progress Notes (Signed)
Physical Therapy Treatment Patient Details Name: Matthew Armstrong. MRN: [id number] DOB: December 03, 1980 Today's Date: 01/30/2023   History of Present Illness Patient is 42  y.o. male presented 11/13 with left-sided numbness and weakness as well as difficulty with his speech. MRI, CT, CTA negative. Similar presentation in the past 3 weeks prior.  MR Brain:No acute intracranial process. No evidence of acute or subacute  infarct. PMH significant for PAF, TIA/CVA, MDD, and Bladder dysfunction.    PT Comments  Increased independence with functional mobility tasks today. Patient was Mod I for bed mobility and transfers using rolling walker. Patient ambulated a short distance in the room with rolling walker with CGA for safety and was able to maintain L grip on the walker throughout. Left leg gait deficits present without knee buckling noted. Patient reports back pain with mobility efforts. The patient is adamant about returning home and has rollator in place. Consider home health PT or outpatient PT at this time.    If plan is discharge home, recommend the following: A little help with walking and/or transfers;A little help with bathing/dressing/bathroom;Supervision due to cognitive status;Help with stairs or ramp for entrance   Can travel by private vehicle        Equipment Recommendations  None recommended by PT    Recommendations for Other Services       Precautions / Restrictions Precautions Precautions: Fall Restrictions Weight Bearing Restrictions: No     Mobility  Bed Mobility Overal bed mobility: Modified Independent             General bed mobility comments: no physical assistance from therapist required. patient does pick up the left leg to return to bed with both hands    Transfers Overall transfer level: Modified independent Equipment used: Rolling walker (2 wheels) Transfers: Sit to/from Stand Sit to Stand: Modified independent (Device/Increase time)            General transfer comment: 2 standing bouts performed with no physical assistance required from therapist    Ambulation/Gait Ambulation/Gait assistance: Contact guard assist Gait Distance (Feet): 15 Feet Assistive device: Rolling walker (2 wheels) Gait Pattern/deviations: Step-to pattern, Decreased step length - left, Decreased stance time - left (L foot supinated throughout) Gait velocity: decrease     General Gait Details: gait deficits present with LLE that does not improve with cues. CGA provided for safety with reinforcement of continued use of assistive device for safety and fall prevention in the home setting   Stairs             Wheelchair Mobility     Tilt Bed    Modified Rankin (Stroke Patients Only)       Balance Overall balance assessment: Needs assistance Sitting-balance support: Feet supported Sitting balance-Leahy Scale: Good Sitting balance - Comments: patient is able to reach behind to pick up his phone from the table with the left arm (bedside table was on the opposite side of the bed) without loss of balance   Standing balance support: Bilateral upper extremity supported Standing balance-Leahy Scale: Poor Standing balance comment: patient relying heavily on rolling walker for spport in standing                            Cognition Arousal: Alert Behavior During Therapy: Flat affect, WFL for tasks assessed/performed Overall Cognitive Status: Within Functional Limits for tasks assessed  General Comments: patient is able to follow all directions. Initially patient unable to demonstrated active movement of the L hand but is able to grip the walker without difficulty        Exercises      General Comments General comments (skin integrity, edema, etc.): patient expressed he feels ready to go home and has a ride on the way to pick him up at 11:00 this morning. he reports he does not need or  want any additional DME and reports he has "plenty of people" that can help him at home if needed      Pertinent Vitals/Pain Pain Assessment Pain Assessment: Faces Faces Pain Scale: Hurts little more Pain Location: lower back from "slipped disc" Pain Descriptors / Indicators: Grimacing, Discomfort Pain Intervention(s): Limited activity within patient's tolerance, Monitored during session, Repositioned    Home Living                          Prior Function            PT Goals (current goals can now be found in the care plan section) Acute Rehab PT Goals Patient Stated Goal: get better PT Goal Formulation: With patient Time For Goal Achievement: 02/12/23 Potential to Achieve Goals: Good Progress towards PT goals: Progressing toward goals    Frequency    Min 1X/week      PT Plan      Co-evaluation              AM-PAC PT "6 Clicks" Mobility   Outcome Measure  Help needed turning from your back to your side while in a flat bed without using bedrails?: None Help needed moving from lying on your back to sitting on the side of a flat bed without using bedrails?: A Little Help needed moving to and from a bed to a chair (including a wheelchair)?: A Little Help needed standing up from a chair using your arms (e.g., wheelchair or bedside chair)?: A Little Help needed to walk in hospital room?: A Little Help needed climbing 3-5 steps with a railing? : A Little 6 Click Score: 19    End of Session   Activity Tolerance: Patient tolerated treatment well Patient left: in bed;with call bell/phone within reach;with bed alarm set Nurse Communication: Mobility status PT Visit Diagnosis: Muscle weakness (generalized) (M62.81);Difficulty in walking, not elsewhere classified (R26.2);Hemiplegia and hemiparesis;Other symptoms and signs involving the nervous system (R29.898);Unsteadiness on feet (R26.81);Other abnormalities of gait and mobility (R26.89)     Time:  1009-1020 PT Time Calculation (min) (ACUTE ONLY): 11 min  Charges:    $Therapeutic Activity: 8-22 mins PT General Charges $$ ACUTE PT VISIT: 1 Visit                    Donna Bernard, PT, MPT    Ina Homes 01/30/2023, 10:53 AM

## 2023-01-30 NOTE — Discharge Instructions (Addendum)
Dear Margaretha Seeds.,  Thank you for letting us participate in your care. You were hospitalized for altered mental status and left sided weakness and diagnosed with AMS (altered mental status). You were treated with thorough workup including .   POST-HOSPITAL & CARE INSTRUCTIONS For mental health service in [facility], contact [place] below.  Follow up with PCP for management of back pain.  Go to your follow up appointments (listed below)   DOCTOR'S APPOINTMENT   Future Appointments  Date Time Provider Department Center  02/26/2023  2:00 PM Bethanie Dicker, NP [facility]     Take care and be well!  Family Medicine Teaching Service Inpatient Team [place]  [facility]  [address] [place], [place] [address] [phone]   --------------------------------  [facility] Please have the patient or guardian call us at [phone] between 8am - 3pm Monday - Friday to complete initial registration and assessment virtuall through our open access process. The patient can also walk-in to our nearest behavioral health outpatient office if preferred. If walking in, note that our offices are closed for lunch from 12-1pm. Please share the following with the patient when instructing them to call/walk-in to [facility]  Have ID and insurance card available Must be physically located in [place] when receiving virtual services If the patient has a legal guardian (parent or court-appointed), the guardian must be present to sign consents to treat

## 2023-01-30 NOTE — Plan of Care (Signed)
Patient discharging home. Volunteer services transported patient to front entrance. AVS printed and reviewed.  Problem: Education: Goal: Knowledge of General Education information will improve Description: Including pain rating scale, medication(s)/side effects and non-pharmacologic comfort measures Outcome: Completed/Met   Problem: Health Behavior/Discharge Planning: Goal: Ability to manage health-related needs will improve Outcome: Completed/Met   Problem: Clinical Measurements: Goal: Ability to maintain clinical measurements within normal limits will improve Outcome: Completed/Met Goal: Will remain free from infection Outcome: Completed/Met Goal: Diagnostic test results will improve Outcome: Completed/Met Goal: Respiratory complications will improve Outcome: Completed/Met Goal: Cardiovascular complication will be avoided Outcome: Completed/Met   Problem: Activity: Goal: Risk for activity intolerance will decrease Outcome: Completed/Met   Problem: Nutrition: Goal: Adequate nutrition will be maintained Outcome: Completed/Met   Problem: Coping: Goal: Level of anxiety will decrease Outcome: Completed/Met   Problem: Elimination: Goal: Will not experience complications related to bowel motility Outcome: Completed/Met Goal: Will not experience complications related to urinary retention Outcome: Completed/Met   Problem: Pain Management: Goal: General experience of comfort will improve Outcome: Completed/Met   Problem: Safety: Goal: Ability to remain free from injury will improve Outcome: Completed/Met   Problem: Skin Integrity: Goal: Risk for impaired skin integrity will decrease Outcome: Completed/Met
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
## 2023-01-30 NOTE — Progress Notes (Signed)
FMTS ATTENDING ADMISSION NOTE Sada Mazzoni,MD I  have seen and examined this patient, reviewed their chart. I have discussed this patient with the resident.   I personally saw and examined this patient today. The patient denies any worsening of his symptoms. He still feels weak on his left UL, but he is now fully awake and alert.  No acute findings on his phsical exam done by me today.  PT/OT evaluated him, and they recommended SNF placement. However, the patient reached out to our team and declined SNF or home with Home health PT. He wants to be d/c home. Per the resident, they reached out to Dr. Otelia Limes from [facility], who recommended no additional neurologic workup inpatient and agreed with the d/c home. The neurology team does not plan on seeing him in the hospital. Outpatient f/u with PCP, Neurology, and Psychiatry is recommended May D/C home today as requested. I will cosign the resident's D/C summary and progress note once it is completed.

## 2023-01-30 NOTE — Discharge Summary (Cosign Needed Addendum)
Family Medicine Teaching [facility] Discharge Summary  Patient name: Matthew Armstrong. Medical record number: [id number] Date of birth: 09-30-1980 Age: 42 y.o. Gender: male Date of Admission: 01/28/2023  Date of Discharge: 01/30/23 Admitting Physician: Doreene Eland, MD  Primary Care Provider: Pcp, No Consultants: Neurology  Indication for Hospitalization: AMS, left hemiplegia, dysarthria  Discharge Diagnoses/Problem List:  Principal Problem for Admission: AMS, left hemiplegia, dysarthria Other Problems addressed during stay:  Principal Problem:   AMS (altered mental status) Active Problems:   Left-sided weakness   Lumbosacral disc herniation   Brief Hospital Course:  Matthew Armstrong. is a 42 y.o.male with a history of paroxysmal A-fib on Eliquis and neurogenic bladder who was admitted to the [facility] Medicine Teaching Service at [facility] for left-sided weakness. His hospital course is detailed below:  Left hemiplegia  dysarthria  altered mental status Initially presented with altered mentation, dysarthria, left hemiplegia.  Altered mentation resolved quickly in ED.  Dysarthria improved within a day.  Left hemiplegia was persistent and mildly improved by discharge.  CT head without contrast, CTA head, and MRI brain were negative for acute findings.  Negative labs include HIV, RPR, vitamin B12.  TSH mildly low.  Ammonia within normal limits.  UDS positive for amphetamine and THC, and it appears patient was taking amphetamine not prescribed. neurology was consulted who found inconsistencies in the exam (e.g. 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  PT OT saw the patient and recommended SNF, which patient refused in addition to home health PT/OT.  Recommended follow-up with PCP and psychiatry and given resources to establish new PCP and psychiatrist but is also established with both already.  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  Extensive psychiatric comorbidities may be playing a role for triggers with functional neurologic disorder.  Hypothermia Presented hypothermic for unclear reasons but potentially due to substance intoxication.  Ruled out sepsis with culture without growth.  Resolved quickly.  AKI Presumably due to poor p.o. intake prior to admission.  Resolved during admission.  Paroxysmal atrial fibrillation On Eliquis.  Cannot find any evidence in the chart to support this diagnosis.  Patient reports he was diagnosed while he was in prison.  Reports he does not follow with a cardiologist for this.  Shared decision making for continuing anticoagulation and patient reported wanting continue.  No evidence of A-fib and normal pulse during admission.  Would recommend stopping Eliquis in the future by PCP.   Other chronic conditions were medically managed with home medications and formulary alternatives as necessary (hyperlipidemia, hypertension, PTSD, functional neurologic disorder)  PCP Follow-up Recommendations: Outpatient neurology and/or psychiatry follow-up for functional neurologic disorder. Psych has been established.  Blood pressure management. Discuss continued risk benefit of Eliquis. Assess if diagnosis of pAfib is accurate.      Disposition: Home, refused SNF and home health PT  Discharge Condition: Stable  Discharge subjective:  PT OT evaluated patient and had recommended SNF placement.  Patient reported that he would not do SNF and wanted to be discharged, despite continued left hemiplegia, which was still significant.  Due to refusing SNF, we offered PT home health and patient refused this too.  Reach out to the neurology service (Dr. Otelia Limes) who is following the patient since admission.  They reported that they were signing off and did not need to see the patient.  Given that neurology  signed off and patient was  refusing SNF and he reported that he wanted to leave the hospital, we were agreeable to doing discharge this morning despite continued hemiplegia.  Patient reported that he has experienced this in the past and had ambulatory devices including electric chair and support from his partner with mobilizing.  Reported that he need to get back to his children and that was the primary reason for him returning home.  Follow-up with PCP and psychiatry were discussed (detailed in patient's discharge instructions) with the patient and he voiced understanding.  Discharge Objective:  Vitals:   01/30/23 0457 01/30/23 0750  BP: 126/73 119/74  Pulse: 82 80  Resp: 16 18  Temp: 99.1 F (37.3 C) 98.9 F (37.2 C)  SpO2: 97% 96%   Physical Exam Constitutional:      General: He is not in acute distress.    Appearance: He is not ill-appearing.  HENT:     Head: Normocephalic and atraumatic.  Cardiovascular:     Rate and Rhythm: Normal rate and regular rhythm.     Heart sounds: Normal heart sounds.  Pulmonary:     Effort: Pulmonary effort is normal.     Breath sounds: Normal breath sounds.  Abdominal:     General: Bowel sounds are normal.     Tenderness: There is no abdominal tenderness.  Musculoskeletal:     Cervical back: Neck supple.  Skin:    General: Skin is warm and dry.  Neurological:     Mental Status: He is alert.     Comments: Alert and oriented to person place and time.  Attention intact.  Short-term memory intact.  No facial weakness.  Extraocular movements intact.  Visual fields intact.  Tongue slight deviation to right.  Palate elevates symmetrically.  Right upper and lower extremities 5/5 strength.  Left upper and lower extremities 2 out of 5 strength (1/5 yesterday).  Sensation decreased on left upper and lower extremity compared to right.  Finger-to-nose intact for right upper extremity and could not be assessed on left upper extremity.  Gait deferred.  Psychiatric:        Mood and Affect:  Mood normal.        Behavior: Behavior normal.    Significant Procedures: None  Significant Labs and Imaging:  Recent Labs  Lab 01/29/23 0522  WBC 10.8*  HGB 11.6*  HCT 35.5*  PLT 346   Recent Labs  Lab 01/29/23 0522 01/30/23 0449  NA 135 139  K 3.7 3.8  CL 101 106  CO2 28 24  GLUCOSE 99 106*  BUN 14 13  CREATININE 1.27* 0.66  CALCIUM 8.7* 8.5*  ALKPHOS 83  --   AST 12*  --   ALT 13  --   ALBUMIN 3.0*  --     CT head WO Contrast 01/28/23 No acute abnormality, Aspects 10   CTA Head Neck W/WO 01/28/23 IMPRESSION: 1. No large vessel occlusion or hemodynamically significant stenosis in the head or neck. 2. No evidence of core infarct or ischemic penumbra on CT perfusion.   MRI Brain pending 01/28/23 IMPRESSION: No acute intracranial process. No evidence of acute or subacute infarct.  Results/Tests Pending at Time of Discharge: None  Discharge Medications:  Allergies as of 01/30/2023       Reactions   Acetaminophen Nausea Only   Ciprofloxacin    Clindamycin/lincomycin    Ibuprofen    Naproxen    Nsaids    Tetracyclines & Related  Medication List     STOP taking these medications    aspirin EC 81 MG tablet   lisinopril 10 MG tablet Commonly known as: ZESTRIL       TAKE these medications    acetaminophen 325 MG tablet Commonly known as: TYLENOL Take 650 mg by mouth once as needed for moderate pain (pain score 4-6).   albuterol 108 (90 Base) MCG/ACT inhaler Commonly known as: VENTOLIN HFA Inhale 1-2 puffs into the lungs as needed for wheezing or shortness of breath.   apixaban 5 MG Tabs tablet Commonly known as: ELIQUIS Take 5 mg by mouth 2 (two) times daily.   atorvastatin 80 MG tablet Commonly known as: LIPITOR Take 1 tablet by mouth at bedtime.   famotidine 20 MG tablet Commonly known as: PEPCID Take 20 mg by mouth daily.   gabapentin 300 MG capsule Commonly known as: NEURONTIN Take 1 capsule (300 mg total) by  mouth 3 (three) times daily. What changed: when to take this   prazosin 2 MG capsule Commonly known as: MINIPRESS Take 2 mg by mouth at bedtime.   sertraline 50 MG tablet Commonly known as: ZOLOFT Take 1 tablet (50 mg total) by mouth daily. What changed: when to take this        Discharge Instructions: Please refer to Patient Instructions section of EMR for full details.  Patient was counseled important signs and symptoms that should prompt return to medical care, changes in medications, dietary instructions, activity restrictions, and follow up appointments.   Follow-Up Appointments:  Follow-up Information     [facility] .   Contact information: [address]  [address] [place] [address] [phone]         [facility] Follow up.   Specialty: Family Medicine Why: Call here for PCP follow up if you cannot wait until Dec for PCP follow up. Contact information: [address] [place] [address] [phone]         Bethanie Dicker, NP Follow up on 02/26/2023.   Specialty: Nurse Practitioner Why: 12/12 @2p . PCP Contact information: [address] Laurell [address] [place] [address] [phone]         [facility]. Schedule an appointment as soon as possible for a visit.   Specialty: Rehabilitation Contact information: [address] [place] [address] [phone]                Meryl Dare, MD PGY-1 Psychiatry Resident 01/30/2023, 7:45 PM  I reviewed the medical decision making and verified the service and findings are accurately documented in the resident's note.  Erick Alley, DO 01/30/2023 7:45 PM

## 2023-01-30 NOTE — Plan of Care (Signed)

## 2023-02-01 LAB — VITAMIN B1: Vitamin B1 (Thiamine): 96.5 nmol/L (ref 66.5–200.0)

## 2023-02-02 LAB — CULTURE, BLOOD (ROUTINE X 2)
Culture: NO GROWTH
Culture: NO GROWTH

## 2023-02-03 ENCOUNTER — Emergency Department
Admission: EM | Admit: 2023-02-03 | Discharge: 2023-02-03 | Discharge status: Against Medical Advice | Payer: 59 | Attending: Emergency Medicine | Admitting: Emergency Medicine

## 2023-02-03 ENCOUNTER — Encounter: Payer: Self-pay | Admitting: *Deleted

## 2023-02-03 ENCOUNTER — Other Ambulatory Visit: Payer: Self-pay

## 2023-02-03 DIAGNOSIS — M545 Low back pain, unspecified: Secondary | ICD-10-CM | POA: Insufficient documentation

## 2023-02-03 DIAGNOSIS — Z5321 Procedure and treatment not carried out due to patient leaving prior to being seen by health care provider: Secondary | ICD-10-CM | POA: Diagnosis not present

## 2023-02-03 NOTE — ED Notes (Signed)
No answer when called several times from lobby 

## 2023-02-03 NOTE — ED Triage Notes (Signed)
Pt ambulatory to triage.  Pt has lower back pain for 4 days.  No recent injury.  Pt taking otc meds for pain.  Pt alert.

## 2023-02-26 ENCOUNTER — Ambulatory Visit: Payer: 59 | Admitting: Nurse Practitioner

## 2023-03-23 ENCOUNTER — Ambulatory Visit
Admission: EM | Admit: 2023-03-23 | Discharge: 2023-03-23 | Disposition: A | Discharge status: Home / Self Care | Payer: 59 | Attending: Family Medicine | Admitting: Family Medicine

## 2023-03-23 DIAGNOSIS — J029 Acute pharyngitis, unspecified: Secondary | ICD-10-CM | POA: Insufficient documentation

## 2023-03-23 DIAGNOSIS — J069 Acute upper respiratory infection, unspecified: Secondary | ICD-10-CM | POA: Insufficient documentation

## 2023-03-23 DIAGNOSIS — R051 Acute cough: Secondary | ICD-10-CM | POA: Insufficient documentation

## 2023-03-23 LAB — POCT INFLUENZA A/B
Influenza A, POC: NEGATIVE
Influenza B, POC: NEGATIVE

## 2023-03-23 LAB — POCT RAPID STREP A (OFFICE): Rapid Strep A Screen: NEGATIVE

## 2023-03-23 MED ORDER — BENZONATATE 200 MG PO CAPS: 200.0000 mg | ORAL_CAPSULE | Freq: Three times a day (TID) | ORAL | 0 refills | Status: DC | PRN

## 2023-03-23 NOTE — ED Triage Notes (Signed)
 Pt presents to UC for c/o sweats and body aches x2-3 days.

## 2023-03-23 NOTE — Discharge Instructions (Signed)
 The clinical contact you with results of the COVID test done today if positive.  You may take Tessalon  as needed for cough.  Lots of rest and fluids.  Please follow-up with your PCP if your symptoms do not improve.  Please go to the ER if you develop any worsening symptoms.  I hope you feel better soon!

## 2023-03-23 NOTE — ED Provider Notes (Signed)
 [facility]    CSN: [id number] Arrival date & time: 03/23/23  1136      History   Chief Complaint Chief Complaint  Patient presents with   Night Sweats    HPI Matthew Ohm. is a 43 y.o. male  presents for evaluation of URI symptoms for 2-3 days. Patient reports associated symptoms of cough, congestion, sore throat, body aches, chills, tactile fevers. Denies N/V/D, ear pain, shortness of breath. Patient does not have a hx of asthma. Patient is not an active smoker.   Pt has taken nothing OTC for symptoms. Pt has no other concerns at this time.   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 Authorizing Provider  benzonatate  (TESSALON ) 200 MG capsule Take 1 capsule (200 mg total) by mouth 3 (three) times daily as needed. 03/23/23  Yes Canesha Tesfaye, Jodi R, NP  acetaminophen  (TYLENOL ) 325 MG tablet Take 650 mg by mouth once as needed for moderate pain (pain score 4-6).    [provider]  albuterol  (VENTOLIN  HFA) 108 (90 Base) MCG/ACT inhaler Inhale 1-2 puffs into the lungs as needed for wheezing or shortness of breath. 05/17/19   [provider]  apixaban  (ELIQUIS ) 5 MG TABS tablet Take 5 mg by mouth 2 (two) times daily.    [provider]  atorvastatin  (LIPITOR ) 80 MG tablet Take 1 tablet by mouth at bedtime.    [provider]  famotidine  (PEPCID ) 20 MG tablet Take 20  mg by mouth daily.    [provider]  gabapentin  (NEURONTIN ) 300 MG capsule Take 1 capsule (300 mg total) by mouth 3 (three) times daily. Patient taking differently: Take 300 mg by mouth 2 (two) times daily. 01/07/23   Dameron, Marisa, DO  prazosin  (MINIPRESS ) 2 MG capsule Take 2 mg by mouth at bedtime. 05/10/21   [provider]  sertraline  (ZOLOFT ) 50 MG tablet Take 1 tablet (50 mg total) by mouth daily. Patient taking differently: Take 50 mg by mouth at bedtime. 01/07/23   Dameron, Marisa, DO    Family History History reviewed. No pertinent family history.  Social History Social History   Tobacco Use   Smoking status: Every Day    Current packs/day: 0.50    Types: Cigarettes   Smokeless tobacco: Never  Vaping Use   Vaping status: Never Used  Substance Use Topics   Alcohol use: Not Currently    Comment: occ   Drug use: No     Allergies   Acetaminophen , Ciprofloxacin, Clindamycin/lincomycin, Ibuprofen, Naproxen, Nsaids, and Tetracyclines & related   Review of Systems Review of Systems  Constitutional:  Positive for chills and fever.  HENT:  Positive for congestion and sore throat.   Respiratory:  Positive for cough.   Musculoskeletal:  Positive for myalgias.     Physical Exam Triage Vital Signs ED Triage Vitals  Encounter Vitals Group  BP 03/23/23 1222 116/78     Systolic BP Percentile --      Diastolic BP Percentile --      Pulse Rate 03/23/23 1222 87     Resp 03/23/23 1222 16     Temp 03/23/23 1222 97.9 F (36.6 C)     Temp Source 03/23/23 1222 Oral     SpO2 03/23/23 1222 96 %     Weight --      Height --      Head Circumference --      Peak Flow --      Pain Score 03/23/23 1221 0     Pain Loc --      Pain Education --      Exclude from Growth Chart --    No data found.  Updated Vital Signs BP 116/78 (BP Location: Left Arm)   Pulse 87   Temp 97.9 F (36.6 C) (Oral)   Resp 16   SpO2 96%   Visual Acuity Right Eye  Distance:   Left Eye Distance:   Bilateral Distance:    Right Eye Near:   Left Eye Near:    Bilateral Near:     Physical Exam Vitals and nursing note reviewed.  Constitutional:      General: He is not in acute distress.    Appearance: Normal appearance. He is not ill-appearing or toxic-appearing.  HENT:     Head: Normocephalic and atraumatic.     Right Ear: Tympanic membrane and ear canal normal.     Left Ear: Tympanic membrane and ear canal normal.     Nose: Congestion present.     Mouth/Throat:     Mouth: Mucous membranes are moist.     Pharynx: Posterior oropharyngeal erythema present.  Eyes:     Pupils: Pupils are equal, round, and reactive to light.  Cardiovascular:     Rate and Rhythm: Normal rate and regular rhythm.     Heart sounds: Normal heart sounds.  Pulmonary:     Effort: Pulmonary effort is normal.     Breath sounds: Normal breath sounds.  Musculoskeletal:     Cervical back: Normal range of motion and neck supple.  Lymphadenopathy:     Cervical: No cervical adenopathy.  Skin:    General: Skin is warm and dry.  Neurological:     General: No focal deficit present.     Mental Status: He is alert and oriented to person, place, and time.  Psychiatric:        Mood and Affect: Mood normal.        Behavior: Behavior normal.      UC Treatments / Results  Labs (all labs ordered are listed, but only abnormal results are displayed) Labs Reviewed  SARS CORONAVIRUS 2 (TAT 6-24 HRS)  CULTURE, GROUP A STREP [facility])  POCT RAPID STREP A (OFFICE)  POCT INFLUENZA A/B    EKG   Radiology No results found.  Procedures Procedures (including critical care time)  Medications Ordered in UC Medications - No data to display  Initial Impression / Assessment and Plan / UC Course  I have reviewed the triage vital signs and the nursing notes.  Pertinent labs & imaging results that were available during my care of the patient were reviewed by me and considered in my  medical decision making (see chart for details).     Reviewed exam and symptoms with patient.  No red flags.  Negative rapid flu and strep, will send COVID PCR and  strep throat culture and contact for any positive results.  Discussed viral illness and symptomatic treatment.  Tessalon  as needed for cough.  PCP follow-up if symptoms do not improve.  ER precautions reviewed. Final Clinical Impressions(s) / UC Diagnoses   Final diagnoses:  Sore throat  Acute cough  Viral upper respiratory illness     Discharge Instructions      The clinical contact you with results of the COVID test done today if positive.  You may take Tessalon  as needed for cough.  Lots of rest and fluids.  Please follow-up with your PCP if your symptoms do not improve.  Please go to the ER if you develop any worsening symptoms.  I hope you feel better soon!     ED Prescriptions     Medication Sig Dispense Auth. Provider   benzonatate  (TESSALON ) 200 MG capsule Take 1 capsule (200 mg total) by mouth 3 (three) times daily as needed. 20 capsule Katrin Grabel, Jodi R, NP      PDMP not reviewed this encounter.   Loreda Myla SAUNDERS, NP 03/23/23 1327

## 2023-03-24 LAB — SARS CORONAVIRUS 2 (TAT 6-24 HRS): SARS Coronavirus 2: NEGATIVE

## 2023-03-26 LAB — CULTURE, GROUP A STREP (THRC)

## 2023-04-24 ENCOUNTER — Emergency Department (HOSPITAL_COMMUNITY): Payer: 59

## 2023-04-24 ENCOUNTER — Inpatient Hospital Stay (HOSPITAL_COMMUNITY)
Admission: EM | Admit: 2023-04-24 | Discharge: 2023-04-26 | DRG: 880 | Disposition: A | Discharge status: Home Health | Payer: 59 | Attending: Internal Medicine | Admitting: Internal Medicine

## 2023-04-24 ENCOUNTER — Encounter (HOSPITAL_COMMUNITY): Payer: Self-pay | Admitting: Neurology

## 2023-04-24 ENCOUNTER — Inpatient Hospital Stay (HOSPITAL_COMMUNITY): Payer: 59

## 2023-04-24 DIAGNOSIS — R29718 NIHSS score 18: Secondary | ICD-10-CM | POA: Diagnosis present

## 2023-04-24 DIAGNOSIS — R2990 Unspecified symptoms and signs involving the nervous system: Principal | ICD-10-CM

## 2023-04-24 DIAGNOSIS — Z888 Allergy status to other drugs, medicaments and biological substances status: Secondary | ICD-10-CM | POA: Diagnosis not present

## 2023-04-24 DIAGNOSIS — Z7901 Long term (current) use of anticoagulants: Secondary | ICD-10-CM

## 2023-04-24 DIAGNOSIS — R531 Weakness: Secondary | ICD-10-CM

## 2023-04-24 DIAGNOSIS — F4310 Post-traumatic stress disorder, unspecified: Secondary | ICD-10-CM | POA: Diagnosis present

## 2023-04-24 DIAGNOSIS — R471 Dysarthria and anarthria: Secondary | ICD-10-CM | POA: Diagnosis present

## 2023-04-24 DIAGNOSIS — G8929 Other chronic pain: Secondary | ICD-10-CM | POA: Diagnosis present

## 2023-04-24 DIAGNOSIS — E785 Hyperlipidemia, unspecified: Secondary | ICD-10-CM | POA: Diagnosis present

## 2023-04-24 DIAGNOSIS — F419 Anxiety disorder, unspecified: Secondary | ICD-10-CM | POA: Diagnosis present

## 2023-04-24 DIAGNOSIS — G8194 Hemiplegia, unspecified affecting left nondominant side: Secondary | ICD-10-CM | POA: Diagnosis present

## 2023-04-24 DIAGNOSIS — Z881 Allergy status to other antibiotic agents status: Secondary | ICD-10-CM

## 2023-04-24 DIAGNOSIS — Z7982 Long term (current) use of aspirin: Secondary | ICD-10-CM | POA: Diagnosis not present

## 2023-04-24 DIAGNOSIS — F17210 Nicotine dependence, cigarettes, uncomplicated: Secondary | ICD-10-CM | POA: Diagnosis present

## 2023-04-24 DIAGNOSIS — F1210 Cannabis abuse, uncomplicated: Secondary | ICD-10-CM | POA: Diagnosis present

## 2023-04-24 DIAGNOSIS — F444 Conversion disorder with motor symptom or deficit: Principal | ICD-10-CM | POA: Diagnosis present

## 2023-04-24 DIAGNOSIS — Z79899 Other long term (current) drug therapy: Secondary | ICD-10-CM | POA: Diagnosis not present

## 2023-04-24 DIAGNOSIS — R4701 Aphasia: Secondary | ICD-10-CM | POA: Diagnosis present

## 2023-04-24 DIAGNOSIS — R29810 Facial weakness: Secondary | ICD-10-CM | POA: Diagnosis present

## 2023-04-24 DIAGNOSIS — I10 Essential (primary) hypertension: Secondary | ICD-10-CM | POA: Diagnosis present

## 2023-04-24 DIAGNOSIS — I6389 Other cerebral infarction: Secondary | ICD-10-CM | POA: Diagnosis present

## 2023-04-24 DIAGNOSIS — I639 Cerebral infarction, unspecified: Secondary | ICD-10-CM | POA: Diagnosis not present

## 2023-04-24 DIAGNOSIS — Z886 Allergy status to analgesic agent status: Secondary | ICD-10-CM | POA: Diagnosis not present

## 2023-04-24 DIAGNOSIS — N319 Neuromuscular dysfunction of bladder, unspecified: Secondary | ICD-10-CM | POA: Diagnosis present

## 2023-04-24 DIAGNOSIS — I480 Paroxysmal atrial fibrillation: Secondary | ICD-10-CM | POA: Diagnosis present

## 2023-04-24 DIAGNOSIS — Z8673 Personal history of transient ischemic attack (TIA), and cerebral infarction without residual deficits: Secondary | ICD-10-CM | POA: Diagnosis not present

## 2023-04-24 DIAGNOSIS — I4891 Unspecified atrial fibrillation: Secondary | ICD-10-CM | POA: Diagnosis not present

## 2023-04-24 HISTORY — DX: Unspecified atrial fibrillation: I48.91

## 2023-04-24 LAB — COMPREHENSIVE METABOLIC PANEL
ALT: 19 U/L (ref 0–44)
AST: 21 U/L (ref 15–41)
Albumin: 4.3 g/dL (ref 3.5–5.0)
Alkaline Phosphatase: 78 U/L (ref 38–126)
Anion gap: 11 (ref 5–15)
BUN: 12 mg/dL (ref 6–20)
CO2: 24 mmol/L (ref 22–32)
Calcium: 9.5 mg/dL (ref 8.9–10.3)
Chloride: 103 mmol/L (ref 98–111)
Creatinine, Ser: 1.05 mg/dL (ref 0.61–1.24)
GFR, Estimated: >60 mL/min (ref >60)
Glucose, Bld: 105 mg/dL — ABNORMAL HIGH (ref 70–99)
Potassium: 3.8 mmol/L (ref 3.5–5.1)
Sodium: 138 mmol/L (ref 135–145)
Total Bilirubin: 0.9 mg/dL (ref 0.0–1.2)
Total Protein: 7.3 g/dL (ref 6.5–8.1)

## 2023-04-24 LAB — CBC
HCT: 46.9 % (ref 39.0–52.0)
Hemoglobin: 16.2 g/dL (ref 13.0–17.0)
MCH: 30.7 pg (ref 26.0–34.0)
MCHC: 34.5 g/dL (ref 30.0–36.0)
MCV: 89 fL (ref 80.0–100.0)
Platelets: 299 10*3/uL (ref 150–400)
RBC: 5.27 MIL/uL (ref 4.22–5.81)
RDW: 13 % (ref 11.5–15.5)
WBC: 8.6 10*3/uL (ref 4.0–10.5)
nRBC: 0 % (ref 0.0–0.2)

## 2023-04-24 LAB — I-STAT CHEM 8, ED
BUN: 14 mg/dL (ref 6–20)
Calcium, Ion: 1.12 mmol/L — ABNORMAL LOW (ref 1.15–1.40)
Chloride: 103 mmol/L (ref 98–111)
Creatinine, Ser: 1.2 mg/dL (ref 0.61–1.24)
Glucose, Bld: 103 mg/dL — ABNORMAL HIGH (ref 70–99)
HCT: 46 % (ref 39.0–52.0)
Hemoglobin: 15.6 g/dL (ref 13.0–17.0)
Potassium: 3.8 mmol/L (ref 3.5–5.1)
Sodium: 140 mmol/L (ref 135–145)
TCO2: 28 mmol/L (ref 22–32)

## 2023-04-24 LAB — DIFFERENTIAL
Abs Immature Granulocytes: 0.02 10*3/uL (ref 0.00–0.07)
Basophils Absolute: 0 10*3/uL (ref 0.0–0.1)
Basophils Relative: 1 %
Eosinophils Absolute: 0.1 10*3/uL (ref 0.0–0.5)
Eosinophils Relative: 1 %
Immature Granulocytes: 0 %
Lymphocytes Relative: 28 %
Lymphs Abs: 2.4 10*3/uL (ref 0.7–4.0)
Monocytes Absolute: 0.5 10*3/uL (ref 0.1–1.0)
Monocytes Relative: 5 %
Neutro Abs: 5.6 10*3/uL (ref 1.7–7.7)
Neutrophils Relative %: 65 %

## 2023-04-24 LAB — ETHANOL: Alcohol, Ethyl (B): <10 mg/dL (ref <10)

## 2023-04-24 LAB — PROTIME-INR
INR: 1 (ref 0.8–1.2)
Prothrombin Time: 13.8 s (ref 11.4–15.2)

## 2023-04-24 LAB — HEMOGLOBIN A1C
Hgb A1c MFr Bld: 5.2 % (ref 4.8–5.6)
Mean Plasma Glucose: 102.54 mg/dL

## 2023-04-24 LAB — APTT: aPTT: 32 s (ref 24–36)

## 2023-04-24 MED ORDER — TRAMADOL HCL 50 MG PO TABS
50.0000 mg | ORAL_TABLET | Freq: Once | ORAL | Status: AC
  Administered 2023-04-24: 50 mg via ORAL
  Filled 2023-04-24: qty 1

## 2023-04-24 MED ORDER — CHLORHEXIDINE GLUCONATE CLOTH 2 % EX PADS
6.0000 | MEDICATED_PAD | Freq: Every day | CUTANEOUS | Status: DC
  Administered 2023-04-24 – 2023-04-25 (×2): 6 via TOPICAL

## 2023-04-24 MED ORDER — ORAL CARE MOUTH RINSE: 15.0000 mL | OROMUCOSAL | Status: DC | PRN

## 2023-04-24 MED ORDER — TENECTEPLASE FOR STROKE
0.2500 mg/kg | PACK | Freq: Once | INTRAVENOUS | Status: AC
  Administered 2023-04-24: 20 mg via INTRAVENOUS
  Filled 2023-04-24: qty 10

## 2023-04-24 MED ORDER — STROKE: EARLY STAGES OF RECOVERY BOOK
Freq: Once | Status: AC
  Administered 2023-04-25: 1
  Filled 2023-04-24: qty 1

## 2023-04-24 MED ORDER — SENNOSIDES-DOCUSATE SODIUM 8.6-50 MG PO TABS: 1.0000 | ORAL_TABLET | Freq: Every evening | ORAL | Status: DC | PRN

## 2023-04-24 MED ORDER — SODIUM CHLORIDE 0.9% FLUSH: 3.0000 mL | INTRAVENOUS | Status: DC | PRN

## 2023-04-24 MED ORDER — SODIUM CHLORIDE 0.9% FLUSH
3.0000 mL | Freq: Two times a day (BID) | INTRAVENOUS | Status: DC
  Administered 2023-04-24: 5 mL via INTRAVENOUS
  Administered 2023-04-25 – 2023-04-26 (×3): 10 mL via INTRAVENOUS

## 2023-04-24 MED ORDER — CLEVIDIPINE BUTYRATE 0.5 MG/ML IV EMUL
0.0000 mg/h | INTRAVENOUS | Status: DC
  Administered 2023-04-24: 2 mg/h via INTRAVENOUS
  Filled 2023-04-24: qty 100

## 2023-04-24 MED ORDER — IOHEXOL 350 MG/ML SOLN
100.0000 mL | Freq: Once | INTRAVENOUS | Status: AC | PRN
  Administered 2023-04-24: 100 mL via INTRAVENOUS

## 2023-04-24 MED ORDER — ACETAMINOPHEN 325 MG PO TABS
650.0000 mg | ORAL_TABLET | Freq: Four times a day (QID) | ORAL | Status: DC | PRN
  Administered 2023-04-25 – 2023-04-26 (×6): 650 mg via ORAL
  Filled 2023-04-24 (×6): qty 2

## 2023-04-24 MED ORDER — PANTOPRAZOLE SODIUM 40 MG IV SOLR
40.0000 mg | Freq: Every day | INTRAVENOUS | Status: DC
  Administered 2023-04-24: 40 mg via INTRAVENOUS
  Filled 2023-04-24: qty 10

## 2023-04-24 NOTE — Progress Notes (Signed)
 eLink Physician-Brief Progress Note Patient Name: Matthew Armstrong. DOB: [birth year] MRN: [id number]   Date of Service  04/24/2023  HPI/Events of Note  Patient admitted as a Code Stroke, he received TNK, work up in progress.  eICU Interventions  New Patient Evaluation.        Letrice Pollok U Lionell Matuszak 04/24/2023, 9:15 PM

## 2023-04-24 NOTE — Progress Notes (Addendum)
 Overnight on-call note  CT head completed due to complaints of headache-no bleed. Still complaining of significant headache Will treat with p.o. meds and reassess. Allergy listed to acetaminophen -says he has not had any allergic reaction.  Will order acetaminophen  as needed and one-time tramadol .  ADDENDUM Called agaiin at 0313 hrs Headache 6/10 - giving one more dose of Tramdaol. Seems like he has had a diagnosis of Functional Neurological Disorder and I am presuming this is likely part of the same. Will not add more tramadol  and work with Tylenol .      -- Eligio Lav, MD Neurologist [facility]

## 2023-04-24 NOTE — Code Documentation (Signed)
 Stroke Response Nurse Documentation Code Documentation  Matthew Armstrong. is a 43 y.o. male arriving to [place]  via [facility] on 04/24/2023 with past medical hx of afib, prior strokes, functional neurological disorder, PTSD. On No antithrombotic. Code stroke was activated by EMS.   Patient from home where he was LKW at 1530 and now complaining of slurred speech, facial droop, dizziness, aphasia. Per patients partner, he was verbally unresponsive with slurred speech, left sided facial droop, and left sided weakness. Patient stated he could not feel his left side at all.   Stroke team at the bedside on patient arrival. Labs drawn and patient cleared for CT by Dr. Elnor. Patient to CT with team. NIHSS 18, see documentation for details and code stroke times. Patient with disoriented, left facial droop, left arm weakness, bilateral leg weakness, left decreased sensation, Expressive aphasia , dysarthria , and Sensory  neglect on exam. The following imaging was completed:  CT Head, CTA, and CTP. Patient received TNK as ordered by MD Lindzen. Patient is not a candidate for IR due to no LVO noted on imaging.   Care Plan: q4min x2hr, q14min x6hr, then q1hr; BP Goal <180/105.   Bedside handoff with ED RN Camie.    [name]  Stroke Response RN

## 2023-04-24 NOTE — ED Provider Notes (Signed)
 [place] EMERGENCY DEPARTMENT AT [facility] Provider Note   CSN: [id number] Arrival date & time: 04/24/23  8194  An emergency department physician performed an initial assessment on this suspected stroke patient at 93.  History  No chief complaint on file.   Matthew Armstrong. is a 43 y.o. male.  Patient is a 43 year old with past medical history of A-fib, prior strokes, functional neurological disorder, PTSD presenting for complaints of strokelike symptoms.  Patient's partner is at the bedside and states he was found at the home verbally unresponsive with left-sided facial droop, left upper extremity weakness, left lower extremity weakness.  Patient also admits to sensation loss on the left side.  He was seen at the bridge and taken to directly to CT for concerns for stroke.  CT head demonstrated no acute process. TNKase  given by neurology.  On my evaluation of the patient, he is talking at this time.  Able to answer questions appropriately.  Admits to left-sided sensation loss as well as motor dysfunction.  The history is provided by the patient. No language interpreter was used.       Home Medications Prior to Admission medications   Medication Sig Start Date End Date Taking? Authorizing Provider  acetaminophen  (TYLENOL ) 325 MG tablet Take 650 mg by mouth once as needed for moderate pain (pain score 4-6).    [provider]  albuterol  (VENTOLIN  HFA) 108 (90 Base) MCG/ACT inhaler Inhale 1-2 puffs into the lungs as needed for wheezing or shortness of breath. 05/17/19   [provider]  apixaban  (ELIQUIS ) 5 MG TABS tablet Take 5 mg by mouth 2 (two) times daily.    [provider]  atorvastatin  (LIPITOR ) 80 MG tablet Take 1 tablet by mouth at bedtime.    [provider]  benzonatate  (TESSALON ) 200 MG capsule Take 1 capsule (200 mg total) by mouth 3 (three) times daily as needed. 03/23/23   Mayer, Jodi R, NP  famotidine  (PEPCID ) 20 MG tablet  Take 20 mg by mouth daily.    [provider]  gabapentin  (NEURONTIN ) 300 MG capsule Take 1 capsule (300 mg total) by mouth 3 (three) times daily. Patient taking differently: Take 300 mg by mouth 2 (two) times daily. 01/07/23   Dameron, Marisa, DO  prazosin  (MINIPRESS ) 2 MG capsule Take 2 mg by mouth at bedtime. 05/10/21   [provider]  sertraline  (ZOLOFT ) 50 MG tablet Take 1 tablet (50 mg total) by mouth daily. Patient taking differently: Take 50 mg by mouth at bedtime. 01/07/23   Dameron, Marisa, DO      Allergies    Acetaminophen , Ciprofloxacin, Clindamycin/lincomycin, Ibuprofen, Naproxen, Nsaids, and Tetracyclines & related    Review of Systems   Review of Systems  Constitutional:  Negative for chills and fever.  HENT:  Negative for ear pain and sore throat.   Eyes:  Negative for pain and visual disturbance.  Respiratory:  Negative for cough and shortness of breath.   Cardiovascular:  Negative for chest pain and palpitations.  Gastrointestinal:  Negative for abdominal pain and vomiting.  Genitourinary:  Negative for dysuria and hematuria.  Musculoskeletal:  Negative for arthralgias and back pain.  Skin:  Negative for color change and rash.  Neurological:  Positive for weakness and numbness. Negative for seizures and syncope.  All other systems reviewed and are negative.   Physical Exam Updated Vital Signs BP 137/82 (BP Location: Right Arm)   Pulse 66   Wt 80.4 kg   BMI  25.43 kg/m  Physical Exam Vitals and nursing note reviewed.  Constitutional:      General: He is not in acute distress.    Appearance: He is well-developed.  HENT:     Head: Normocephalic and atraumatic.  Eyes:     Conjunctiva/sclera: Conjunctivae normal.  Cardiovascular:     Rate and Rhythm: Normal rate and regular rhythm.     Heart sounds: No murmur heard. Pulmonary:     Effort: Pulmonary effort is normal. No respiratory distress.     Breath sounds: Normal breath sounds.   Abdominal:     Palpations: Abdomen is soft.     Tenderness: There is no abdominal tenderness.  Musculoskeletal:        General: No swelling.     Cervical back: Neck supple.  Skin:    General: Skin is warm and dry.     Capillary Refill: Capillary refill takes less than 2 seconds.  Neurological:     Mental Status: He is alert.     GCS: GCS eye subscore is 4. GCS verbal subscore is 5.     Comments: Left-sided facial droop present.  Patient awake and alert at this time.  Answering questions appropriately.  Left-sided facial droop present.  Unable to detect sensation in the left upper and lower extremity.  No movement in the left or lower extremity.  Psychiatric:        Mood and Affect: Mood normal.     ED Results / Procedures / Treatments   Labs (all labs ordered are listed, but only abnormal results are displayed) Labs Reviewed  COMPREHENSIVE METABOLIC PANEL - Abnormal; Notable for the following components:      Result Value   Glucose, Bld 105 (*)    All other components within normal limits  I-STAT CHEM 8, ED - Abnormal; Notable for the following components:   Glucose, Bld 103 (*)    Calcium , Ion 1.12 (*)    All other components within normal limits  PROTIME-INR  APTT  CBC  DIFFERENTIAL  ETHANOL  LIPID PANEL  HEMOGLOBIN A1C  RAPID URINE DRUG SCREEN, HOSP PERFORMED  CBC  BASIC METABOLIC PANEL  MAGNESIUM   PHOSPHORUS  CBG MONITORING, ED    EKG None  Radiology CT ANGIO HEAD NECK W WO CM W PERF (CODE STROKE) Result Date: 04/24/2023 CLINICAL DATA:  Neuro deficit, acute, stroke suspected. Left-sided deficits. EXAM: CT ANGIOGRAPHY HEAD AND NECK CT PERFUSION BRAIN TECHNIQUE: Multidetector CT imaging of the head and neck was performed using the standard protocol during bolus administration of intravenous contrast. Multiplanar CT image reconstructions and MIPs were obtained to evaluate the vascular anatomy. Carotid stenosis measurements (when applicable) are obtained utilizing  NASCET criteria, using the distal internal carotid diameter as the denominator. Multiphase CT imaging of the brain was performed following IV bolus contrast injection. Subsequent parametric perfusion maps were calculated using RAPID software. RADIATION DOSE REDUCTION: This exam was performed according to the departmental dose-optimization program which includes automated exposure control, adjustment of the mA and/or kV according to patient size and/or use of iterative reconstruction technique. CONTRAST:  OMNIPAQUE  IOHEXOL  350 MG/ML SOLN COMPARISON:  MR head without contrast 01/28/2023. CT angio head and neck 01/28/2023. FINDINGS: CTA NECK FINDINGS Aortic arch: A 3 vessel arch configuration is present. No atherosclerotic change, stenosis or aneurysm is present. No dissection is present. Right carotid system: The right common carotid artery is normal. Bifurcation is unremarkable. The cervical right ICA is normal. Left carotid system: The left common carotid artery  is within normal limits. Bifurcation is unremarkable. The cervical left ICA is normal. Vertebral arteries: The vertebral arteries are codominant. Both vertebral arteries originate from the subclavian arteries without significant stenosis. No significant stenosis is present in either vertebral artery in the neck. Skeleton: Vertebral body heights and alignment are normal. Mild straightening of the normal cervical lordosis is present. No focal osseous lesions are present Other neck: Soft tissues the neck are otherwise unremarkable. Salivary glands are within normal limits. Thyroid  is normal. No significant adenopathy is present. No focal mucosal or submucosal lesions are present. Upper chest: The lung apices are clear. The thoracic inlet is within normal limits. Review of the MIP images confirms the above findings CTA HEAD FINDINGS Anterior circulation: Internal carotid arteries are within normal limits the skull base to the ICA termini. The A1 and M1  segments are normal. The MCA bifurcations are within normal limits bilaterally. The ACA and MCA branch vessels are normal. No aneurysm is present. Posterior circulation: The PICA origins are visualized and normal. Vertebrobasilar junction basilar artery are normal. The superior cerebellar arteries are patent. The left posterior cerebral artery originates from the basilar tip. The right posterior cerebral artery is of fetal type. The PCA branch vessels are normal bilaterally. Venous sinuses: The dural sinuses are patent. The straight sinus and deep cerebral veins are intact. Cortical veins are within normal limits. No significant vascular malformation is evident. Anatomic variants: None Review of the MIP images confirms the above findings CT Brain Perfusion Findings: ASPECTS: 10/10 CBF (<30%) Volume: 0mL Perfusion (Tmax>6.0s) volume: 0mL Mismatch Volume: 0mL IMPRESSION: 1. Normal CTA of the head and neck. No significant proximal stenosis, aneurysm, or branch vessel occlusion. 2. Normal CT perfusion. Electronically Signed   By: Lonni Necessary M.D.   On: 04/24/2023 18:46   CT HEAD CODE STROKE WO CONTRAST Result Date: 04/24/2023 CLINICAL DATA:  Code stroke. Neuro deficit, acute, stroke suspected. Aphasia, slurred speech and facial droop. Dizziness. Last known well at 5:15 p.m. EXAM: CT HEAD WITHOUT CONTRAST TECHNIQUE: Contiguous axial images were obtained from the base of the skull through the vertex without intravenous contrast. RADIATION DOSE REDUCTION: This exam was performed according to the departmental dose-optimization program which includes automated exposure control, adjustment of the mA and/or kV according to patient size and/or use of iterative reconstruction technique. COMPARISON:  MR head without contrast 01/28/2023 FINDINGS: Brain: No acute infarct, hemorrhage, or mass lesion is present. No significant white matter lesions are present. Deep brain nuclei are within normal limits. The ventricles are  of normal size. No significant extraaxial fluid collection is present. The brainstem and cerebellum are within normal limits. Midline structures are within normal limits. Vascular: No hyperdense vessel or unexpected calcification. Skull: Insert normal skull No significant extracranial soft tissue lesion is present. Sinuses/Orbits: The left maxillary sinus is opacified. Secretions are present in the left sphenoid sinus. The paranasal sinuses and mastoid air cells are otherwise clear. ASPECTS [facility] Stroke Program Early CT Score) - Ganglionic level infarction (caudate, lentiform nuclei, internal capsule, insula, M1-M3 cortex): 7/7 - Supraganglionic infarction (M4-M6 cortex): 3/3 Total score (0-10 with 10 being normal): 10/10 IMPRESSION: 1. Normal CT appearance of the brain. 2. Aspects is 10/10. 3. Left maxillary and sphenoid sinus disease. Electronically Signed   By: Lonni Necessary M.D.   On: 04/24/2023 18:23    Procedures .Critical Care  Performed by: Elnor Bernarda SQUIBB, DO Authorized by: Elnor Bernarda SQUIBB, DO   Critical care provider statement:    Critical care time (  minutes):  30   Critical care was time spent personally by me on the following activities:  Development of treatment plan with patient or surrogate, discussions with consultants, evaluation of patient's response to treatment, examination of patient, ordering and review of laboratory studies, ordering and review of radiographic studies, ordering and performing treatments and interventions, pulse oximetry, re-evaluation of patient's condition and review of old charts   Care discussed with: admitting provider       Medications Ordered in ED Medications   stroke: early stages of recovery book (has no administration in time range)  senna-docusate (Senokot-S) tablet 1 tablet (has no administration in time range)  pantoprazole  (PROTONIX ) injection 40 mg (has no administration in time range)  sodium chloride  flush (NS) 0.9 % injection 3-10  mL (has no administration in time range)  sodium chloride  flush (NS) 0.9 % injection 3-10 mL (has no administration in time range)  clevidipine  (CLEVIPREX ) infusion 0.5 mg/mL (2 mg/hr Intravenous New Bag/Given 04/24/23 1850)  tenecteplase  (TNKASE ) injection for Stroke 20 mg (20 mg Intravenous Given 04/24/23 1824)  iohexol  (OMNIPAQUE ) 350 MG/ML injection 100 mL (100 mLs Intravenous Contrast Given 04/24/23 1833)    ED Course/ Medical Decision Making/ A&P                                 Medical Decision Making Risk Decision regarding hospitalization.   43 year old with past medical history of A-fib, prior strokes, functional neurological disorder, PTSD presenting for complaints of strokelike symptoms.    On arrival patient was nonverbal with left facial droop left upper extremity weakness and sensation loss, left lower extremity we send sensation loss.  Given TNKase  neurology with Dr. Lindzen on.  Neurology will be primary for admitting.  Currently patient is verbal but still has left-sided facial droop, lower extremity and upper extremity sensation and motor dysfunction.  Does are stable.        Final Clinical Impression(s) / ED Diagnoses Final diagnoses:  Stroke-like symptoms    Rx / DC Orders ED Discharge Orders     None         Elnor Bernarda SQUIBB, DO 04/24/23 1854

## 2023-04-24 NOTE — H&P (Signed)
 NEUROLOGY H&P NOTE   Date of service: April 24, 2023 Patient Name: Matthew Armstrong. MRN:  [id number] DOB:  05-May-1980 Chief Complaint: slurred speech, facial droop, dizziness and aphasia, left sided weakness  History of Present Illness  Matthew Armstrong. is a 43 y.o. male with hx of a fib (has not taken his Eliquis  in 3 weeks), HTN, anxiety, spinal cord injury with neurogenic bladder, prior strokes, functional neurological symptoms disorder, PTSD and chronic pain who presents from home via EMS for acute onset of slurred speech, facial droop, dizziness, left side weakness and aphasia. Code stroke was activated by EMS. Per patient he was LKW @ 1530 when he acutely developed above symptoms. NIHSS 18. CT head with no acute process, aspects 10. CTA head and neck and perfusion normal. TNK was administered at 1824. Patient will be admitted to the Neuro ICU for further management.    Last known well: 1530 Modified rankin score: 1-No significant post stroke disability and can perform usual duties with stroke symptoms IV Thrombolysis: yes 1824 Thrombectomy:no LVO   NIHSS components Score: Comment  1a Level of Conscious 0[x]  1[]  2[]  3[]      1b LOC Questions 0[]  1 2[x]       1c LOC Commands 0[x]  1[]  2[]       2 Best Gaze 0[x]  1[]  2[]       3 Visual 0[x]  1[]  2[]  3[]      4 Facial Palsy 0[]  1[]  2[x]  3[]      5a Motor Arm - left 0[]  1[]  2[]  3[x]  4[]  UN[]    5b Motor Arm - Right 0[x]  1[]  2[]  3[]  4[]  UN[]    6a Motor Leg - Left 0[]  1[]  2[]  3[]  4[x]  UN[]    6b Motor Leg - Right 0[]  1[x]  2[]  3[]  4[]  UN[]    7 Limb Ataxia 0[x]  1[]  2[]  3[]  UN[]     8 Sensory 0[]  1[]  2[x]  UN[]      9 Best Language 0[]  1[x]  2[]  3[]      10 Dysarthria 0[]  1[x]  2[]  UN[]      11 Extinct. and Inattention 0[]  1[]  2[x]       TOTAL:  18      ROS  Comprehensive ROS performed and pertinent positives documented in the HPI   Past History   Past Medical History:  Diagnosis Date   Bladder disorder    Hypokalemia 01/04/2023   Past  Surgical History:  Procedure Laterality Date   BACK SURGERY     No family history on file. Social History   Socioeconomic History   Marital status: Married    Spouse name: Not on file   Number of children: Not on file   Years of education: Not on file   Highest education level: Not on file  Occupational History   Not on file  Tobacco Use   Smoking status: Every Day    Current packs/day: 0.50    Types: Cigarettes   Smokeless tobacco: Never  Vaping Use   Vaping status: Never Used  Substance and Sexual Activity   Alcohol use: Not Currently    Comment: occ   Drug use: No   Sexual activity: Not on file  Other Topics Concern   Not on file  Social History Narrative   Not on file   Social Drivers of Health   Financial Resource Strain: Low Risk  (09/14/2022)   Received [facility]   Overall Financial Resource Strain (CARDIA)    Difficulty of Paying Living Expenses: Not hard at all  Food Insecurity:  No Food Insecurity (01/28/2023)   Hunger Vital Sign    Worried About Running Out of Food in the Last Year: Never true    Ran Out of Food in the Last Year: Never true  Transportation Needs: No Transportation Needs (01/28/2023)   PRAPARE - Administrator, Civil Service (Medical): No    Lack of Transportation (Non-Medical): No  Physical Activity: Not on file  Stress: Not on file  Social Connections: Unknown (09/05/2022)   Received from [facility]   Social Network    Social Network: Not on file   Allergies  Allergen Reactions   Acetaminophen  Nausea Only   Ciprofloxacin    Clindamycin/Lincomycin    Ibuprofen    Naproxen    Nsaids    Tetracyclines & Related     Medications   Current Facility-Administered Medications:    tenecteplase  (TNKASE ) injection for Stroke 20 mg, 0.25 mg/kg, Intravenous, Once, Merrianne Locus, MD  Current Outpatient Medications:    acetaminophen  (TYLENOL ) 325 MG tablet, Take 650 mg by mouth once as needed for moderate pain  (pain score 4-6)., Disp: , Rfl:    albuterol  (VENTOLIN  HFA) 108 (90 Base) MCG/ACT inhaler, Inhale 1-2 puffs into the lungs as needed for wheezing or shortness of breath., Disp: , Rfl:    apixaban  (ELIQUIS ) 5 MG TABS tablet, Take 5 mg by mouth 2 (two) times daily., Disp: , Rfl:    atorvastatin  (LIPITOR ) 80 MG tablet, Take 1 tablet by mouth at bedtime., Disp: , Rfl:    benzonatate  (TESSALON ) 200 MG capsule, Take 1 capsule (200 mg total) by mouth 3 (three) times daily as needed., Disp: 20 capsule, Rfl: 0   famotidine  (PEPCID ) 20 MG tablet, Take 20 mg by mouth daily., Disp: , Rfl:    gabapentin  (NEURONTIN ) 300 MG capsule, Take 1 capsule (300 mg total) by mouth 3 (three) times daily. (Patient taking differently: Take 300 mg by mouth 2 (two) times daily.), Disp: 90 capsule, Rfl: 3   prazosin  (MINIPRESS ) 2 MG capsule, Take 2 mg by mouth at bedtime., Disp: , Rfl:    sertraline  (ZOLOFT ) 50 MG tablet, Take 1 tablet (50 mg total) by mouth daily. (Patient taking differently: Take 50 mg by mouth at bedtime.), Disp: 30 tablet, Rfl: 3  Note: He has not taken his Eliquis  for the past 3 weeks. States that he was in jail and that it was not given to him there. States that he has not restarted this medication since release from jail.   Vitals   Vitals:   04/24/23 1811  Weight: 80.4 kg     Body mass index is 25.43 kg/m.  Physical Exam   Constitutional: Appears well-developed and well-nourished.  Psych: Affect appropriate to situation.  Eyes: No scleral injection.  HENT: No OP obstruction.  Head: Normocephalic.  Cardiovascular: Normal rate and regular rhythm.  Respiratory: Effort normal, non-labored breathing.  GI: Soft.  No distension. There is no tenderness.  Skin: WDI.   Neurologic Examination   Mental Status -  He is awake and alert. Oriented to self, year and state. Otherwise poorly oriented. Thought process is linear based on multiple questions with answers. He is aware of his left sided  weakness. Able to provide a description of his recent symptoms using short answers to detailed questions. Speech is halting, with expressive aphasia and dysarthria. No receptive aphasia noted.  Cranial Nerves II - XII - II - Visual fields intact OU. PERRL.  III, IV, VI - Extraocular movements intact. V -  Facial sensation intact bilaterally. VII  Left facial droop. VIII - Hearing & vestibular intact bilaterally. X - Palate elevates symmetrically. XI - Chin turning & shoulder shrug intact bilaterally. XII - Tongue deviated to the right   Motor Strength - Right arm 5/5, right leg 4/5, left arm 2/5, left leg 1-2/5 .   Motor Tone - Muscle tone was assessed at the neck and appendages and was normal. Sensory - Insensate to LUE and LLE Coordination - No ataxia with FNF on the right. Unable to perform on the left. Tremor was absent. Gait and Station - Deferred.  Labs   CBC:  Recent Labs  Lab 04/24/23 1815  HGB 15.6  HCT 46.0   Basic Metabolic Panel:  Lab Results  Component Value Date   NA 140 04/24/2023   K 3.8 04/24/2023   CO2 24 01/30/2023   GLUCOSE 103 (H) 04/24/2023   BUN 14 04/24/2023   CREATININE 1.20 04/24/2023   CALCIUM  8.5 (L) 01/30/2023   GFRNONAA >60 01/30/2023   GFRAA >60 10/14/2019   Lipid Panel: No results found for: LDLCALC HgbA1c: No results found for: HGBA1C Urine Drug Screen:     Component Value Date/Time   LABOPIA NONE DETECTED 01/28/2023 1619   COCAINSCRNUR NONE DETECTED 01/28/2023 1619   LABBENZ NONE DETECTED 01/28/2023 1619   AMPHETMU POSITIVE (A) 01/28/2023 1619   THCU POSITIVE (A) 01/28/2023 1619   LABBARB NONE DETECTED 01/28/2023 1619    Alcohol Level     Component Value Date/Time   ETH <10 01/28/2023 2015   INR  Lab Results  Component Value Date   INR 1.1 01/28/2023   APTT  Lab Results  Component Value Date   APTT 30 01/28/2023     CT Head without contrast(Personally reviewed): No acute process, aspects 10   CT angio Head  and Neck with contrast (Personally reviewed): 1. Normal CTA of the head and neck. No significant proximal stenosis, aneurysm, or branch vessel occlusion. 2. Normal CT perfusion.    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 - Neurological exam reveals findings best referable to the right MCA territory. NIHSS 18. - CT head negative. - After comprehensive review of possible contraindications, he has no absolute contraindications to TNK administration. - Patient is a TNK candidate. Discussed extensively the risks/benefits of TNK treatment vs. no treatment with the patient, including risks of hemorrhage and death with TNK administration versus worse overall outcomes on average in patients within the IV thrombolytic time window who are not administered TNK. Overall benefits of TNK regarding long-term prognosis are felt to outweigh risks. The patient expressed understanding and wish to proceed with TNK.   - CTA obtained after TNK reveals no LVO or perfusion deficit.   Primary Diagnosis:  Acute right hemisphere ischemic stroke secondary to atrial fibrillation off anticoagulation  Secondary Diagnosis: Essential (primary) hypertension and Paroxysmal atrial fibrillation  Plan  - Admit to the neuro ICU  - HgbA1c, fasting lipid panel - MRI of the brain without contrast - Frequent neuro checks - Echocardiogram - Hold Antiplatelet meds or anticoagulants for 24 hrs post TNK  DVT prophylaxis with SCDs - 24 hr brain imaging post TNK. Can restart Eliquis  if CT head at 24 hours is negative for hemorrhagic conversion - Statin - BP goal <180/105 - Cleviprex  drip to maintain bp  goal  - Risk factor modification - Telemetry monitoring - PT consult, OT consult, Speech consult - Stroke team to follow in the  morning ______________________________________________________________________  Karna Geralds DNP, ACNPC-AG  [facility]  I have seen and examined the patient. I have formulated the assessment and recommendations. 43 y.o. male who presents to [facility] ED via EMS as a code stroke for acute onset of facial droop, slurred speech, left sided sensory loss and left sided weakness. Neurological exam reveals findings best referable to the right MCA territory. NIHSS 18. CT head negative. No absolute contraindications to TNK. TNK administered after obtaining informed consent. Stroke work up and [employer] as documented above.  Electronically signed: Dr. Toney Lizaola

## 2023-04-24 NOTE — Plan of Care (Signed)
 Overnight on call note  Called by RN.  Pt c/o headache. Significant left hemiparesis incl facial weakness on left. Asking for PRN pain meds. Will first get a CTH to r/o HT.  Once [facility] is done, will make decisions on pain meds.  -- Eligio Lav, MD Neurologist

## 2023-04-24 NOTE — Progress Notes (Signed)
 PHARMACIST CODE STROKE RESPONSE  Notified to mix TNK at 1822 by Dr. Merrianne TNK preparation completed at 1824 (given at same time).  TNK dose = 20 mg IV over 5 seconds  Issues/delays encountered (if applicable): N/a  Matthew Armstrong 04/24/23 6:26 PM

## 2023-04-25 ENCOUNTER — Inpatient Hospital Stay (HOSPITAL_COMMUNITY): Payer: 59

## 2023-04-25 DIAGNOSIS — I639 Cerebral infarction, unspecified: Secondary | ICD-10-CM | POA: Diagnosis not present

## 2023-04-25 DIAGNOSIS — I4891 Unspecified atrial fibrillation: Secondary | ICD-10-CM

## 2023-04-25 LAB — RAPID URINE DRUG SCREEN, HOSP PERFORMED
Amphetamines: NOT DETECTED
Barbiturates: NOT DETECTED
Benzodiazepines: NOT DETECTED
Cocaine: NOT DETECTED
Opiates: NOT DETECTED
Tetrahydrocannabinol: POSITIVE — AB

## 2023-04-25 LAB — LIPID PANEL
Cholesterol: 175 mg/dL (ref 0–200)
HDL: 37 mg/dL — ABNORMAL LOW (ref >40)
LDL Cholesterol: 116 mg/dL — ABNORMAL HIGH (ref 0–99)
Total CHOL/HDL Ratio: 4.7 {ratio}
Triglycerides: 108 mg/dL (ref <150)
VLDL: 22 mg/dL (ref 0–40)

## 2023-04-25 LAB — ECHOCARDIOGRAM COMPLETE
AR max vel: 2.65 cm2
AV Area VTI: 2.95 cm2
AV Area mean vel: 3.05 cm2
AV Mean grad: 3 mm[Hg]
AV Peak grad: 4.8 mm[Hg]
Ao pk vel: 1.09 m/s
Area-P 1/2: 4.86 cm2
Height: 70 in
S' Lateral: 2.7 cm
Weight: 2740.76 [oz_av]

## 2023-04-25 LAB — BASIC METABOLIC PANEL
Anion gap: 15 (ref 5–15)
BUN: 10 mg/dL (ref 6–20)
CO2: 27 mmol/L (ref 22–32)
Calcium: 9.6 mg/dL (ref 8.9–10.3)
Chloride: 97 mmol/L — ABNORMAL LOW (ref 98–111)
Creatinine, Ser: 1.24 mg/dL (ref 0.61–1.24)
GFR, Estimated: >60 mL/min (ref >60)
Glucose, Bld: 85 mg/dL (ref 70–99)
Potassium: 3.6 mmol/L (ref 3.5–5.1)
Sodium: 139 mmol/L (ref 135–145)

## 2023-04-25 LAB — PHOSPHORUS: Phosphorus: 5.8 mg/dL — ABNORMAL HIGH (ref 2.5–4.6)

## 2023-04-25 LAB — CBC
HCT: 44.2 % (ref 39.0–52.0)
Hemoglobin: 15.2 g/dL (ref 13.0–17.0)
MCH: 30.9 pg (ref 26.0–34.0)
MCHC: 34.4 g/dL (ref 30.0–36.0)
MCV: 89.8 fL (ref 80.0–100.0)
Platelets: 283 10*3/uL (ref 150–400)
RBC: 4.92 MIL/uL (ref 4.22–5.81)
RDW: 13.2 % (ref 11.5–15.5)
WBC: 6.9 10*3/uL (ref 4.0–10.5)
nRBC: 0 % (ref 0.0–0.2)

## 2023-04-25 LAB — MAGNESIUM: Magnesium: 2.1 mg/dL (ref 1.7–2.4)

## 2023-04-25 LAB — MRSA NEXT GEN BY PCR, NASAL: MRSA by PCR Next Gen: DETECTED — AB

## 2023-04-25 MED ORDER — ASPIRIN 300 MG RE SUPP: 300.0000 mg | Freq: Every day | RECTAL | Status: DC

## 2023-04-25 MED ORDER — PRAZOSIN HCL 2 MG PO CAPS
2.0000 mg | ORAL_CAPSULE | Freq: Every day | ORAL | Status: DC
  Administered 2023-04-25: 2 mg via ORAL
  Filled 2023-04-25 (×2): qty 1

## 2023-04-25 MED ORDER — ASPIRIN 81 MG PO CHEW
81.0000 mg | CHEWABLE_TABLET | Freq: Every day | ORAL | Status: DC
  Administered 2023-04-25 – 2023-04-26 (×2): 81 mg via ORAL
  Filled 2023-04-25 (×2): qty 1

## 2023-04-25 MED ORDER — MUPIROCIN 2 % EX OINT
1.0000 | TOPICAL_OINTMENT | Freq: Two times a day (BID) | CUTANEOUS | Status: DC
  Administered 2023-04-25 – 2023-04-26 (×4): 1 via NASAL
  Filled 2023-04-25: qty 22

## 2023-04-25 MED ORDER — TRAMADOL HCL 50 MG PO TABS
50.0000 mg | ORAL_TABLET | Freq: Once | ORAL | Status: AC
  Administered 2023-04-25: 50 mg via ORAL
  Filled 2023-04-25: qty 1

## 2023-04-25 MED ORDER — SERTRALINE HCL 50 MG PO TABS
50.0000 mg | ORAL_TABLET | Freq: Every day | ORAL | Status: DC
  Administered 2023-04-25 – 2023-04-26 (×2): 50 mg via ORAL
  Filled 2023-04-25 (×2): qty 1

## 2023-04-25 MED ORDER — FAMOTIDINE 20 MG PO TABS
20.0000 mg | ORAL_TABLET | Freq: Every day | ORAL | Status: DC
  Administered 2023-04-25 – 2023-04-26 (×2): 20 mg via ORAL
  Filled 2023-04-25 (×2): qty 1

## 2023-04-25 MED ORDER — GABAPENTIN 300 MG PO CAPS
300.0000 mg | ORAL_CAPSULE | Freq: Three times a day (TID) | ORAL | Status: DC
  Administered 2023-04-25 – 2023-04-26 (×4): 300 mg via ORAL
  Filled 2023-04-25 (×4): qty 1

## 2023-04-25 MED ORDER — TIZANIDINE HCL 2 MG PO TABS
2.0000 mg | ORAL_TABLET | Freq: Three times a day (TID) | ORAL | Status: DC | PRN
  Administered 2023-04-25 – 2023-04-26 (×3): 2 mg via ORAL
  Filled 2023-04-25 (×4): qty 1

## 2023-04-25 MED ORDER — ATORVASTATIN CALCIUM 80 MG PO TABS
80.0000 mg | ORAL_TABLET | Freq: Every day | ORAL | Status: DC
  Administered 2023-04-25 – 2023-04-26 (×2): 80 mg via ORAL
  Filled 2023-04-25 (×2): qty 1

## 2023-04-25 NOTE — Progress Notes (Addendum)
 STROKE TEAM PROGRESS NOTE   BRIEF HPI [name]. is a 43 y.o. male with history of AF not on Eliquis  for 3 weeks PTA, HTN, anxiety, spinal cord injury with neurogenic bladder, reported prior strokes, with similar presentation in July 2021 with left-sided weakness, facial droop, and dysarthria, functional neurologic symptims disorder, PTDS, and chronic pain presenting with acute onset of slurred speech, facial droop, dizziness, left-sided weakness, and aphasia s/p TNK administration.   SIGNIFICANT HOSPITAL EVENTS 2/7:  - Presented with dysarthria, facial droop, dizziness, left-sided weakness, and aphasia s/p TNK @ 18:24 - CTH negative - CTA and CTP unremarkable, no acute findings -Repeat CT head due to headache complaint without acute findings.  INTERIM HISTORY/SUBJECTIVE MRI brain pending Patient neurologic exam is unchanged.  Patient [place] [facility] [place] [facility] [place] [facility] and they did not appropriately administer his medications while incarcerated.  No further complaints of headache on today's examination.  OBJECTIVE CBC    Component Value Date/Time   WBC 6.9 04/25/2023 0419   RBC 4.92 04/25/2023 0419   HGB 15.2 04/25/2023 0419   HCT 44.2 04/25/2023 0419   PLT 283 04/25/2023 0419   MCV 89.8 04/25/2023 0419   MCH 30.9 04/25/2023 0419   MCHC 34.4 04/25/2023 0419   RDW 13.2 04/25/2023 0419   LYMPHSABS 2.4 04/24/2023 1808   MONOABS 0.5 04/24/2023 1808   EOSABS 0.1 04/24/2023 1808   BASOSABS 0.0 04/24/2023 1808   BMET    Component Value Date/Time   NA 139 04/25/2023 0419   K 3.6 04/25/2023 0419   CL 97 (L) 04/25/2023 0419   CO2 27 04/25/2023 0419   GLUCOSE 85 04/25/2023 0419   BUN 10 04/25/2023 0419   CREATININE 1.24 04/25/2023 0419   CALCIUM  9.6 04/25/2023 0419   GFRNONAA >60 04/25/2023 0419   Drugs of Abuse     Component Value Date/Time   LABOPIA NONE DETECTED 04/24/2023 2054   COCAINSCRNUR NONE DETECTED 04/24/2023 2054   LABBENZ NONE DETECTED  04/24/2023 2054   AMPHETMU NONE DETECTED 04/24/2023 2054   THCU POSITIVE (A) 04/24/2023 2054   LABBARB NONE DETECTED 04/24/2023 2054   Lab Results  Component Value Date   CHOL 175 04/25/2023   HDL 37 (L) 04/25/2023   LDLCALC 116 (H) 04/25/2023   TRIG 108 04/25/2023   CHOLHDL 4.7 04/25/2023   Lab Results  Component Value Date   HGBA1C 5.2 04/24/2023   IMAGING past 24 hours CT HEAD WO CONTRAST ( ) Result Date: 04/24/2023 CLINICAL DATA:  Follow-up examination for stroke, headache status post TNK. EXAM: CT HEAD WITHOUT CONTRAST TECHNIQUE: Contiguous axial images were obtained from the base of the skull through the vertex without intravenous contrast. RADIATION DOSE REDUCTION: This exam was performed according to the departmental dose-optimization program which includes automated exposure control, adjustment of the mA and/or kV according to patient size and/or use of iterative reconstruction technique. COMPARISON:  Prior CT and CTA from earlier the same day. FINDINGS: Brain: Cerebral volume within normal limits. No acute intracranial hemorrhage seen status post TNK. No convincing acute or evolving large vessel territory infarct. No mass lesion or midline shift. No hydrocephalus or extra-axial fluid collection. Vascular: Contrast material remains on board from prior CTA. Skull: Scalp soft tissues demonstrate no acute abnormality. Calvarium intact. Sinuses/Orbits: Globes and orbital soft tissues within normal limits. Chronic left sphenoid and maxillary sinusitis again noted. Mastoid air cells remain clear. Other: None. IMPRESSION: 1. No acute intracranial hemorrhage or other l abnormality  status post TNK. No convincing acute or evolving large vessel territory infarct evident at this time. 2. Chronic left sphenoid and maxillary sinusitis. Electronically Signed   By: Morene Hoard M.D.   On: 04/24/2023 20:23   CT ANGIO HEAD NECK W WO CM W PERF (CODE STROKE) Result Date: 04/24/2023 CLINICAL DATA:   Neuro deficit, acute, stroke suspected. Left-sided deficits. EXAM: CT ANGIOGRAPHY HEAD AND NECK CT PERFUSION BRAIN TECHNIQUE: Multidetector CT imaging of the head and neck was performed using the standard protocol during bolus administration of intravenous contrast. Multiplanar CT image reconstructions and MIPs were obtained to evaluate the vascular anatomy. Carotid stenosis measurements (when applicable) are obtained utilizing NASCET criteria, using the distal internal carotid diameter as the denominator. Multiphase CT imaging of the brain was performed following IV bolus contrast injection. Subsequent parametric perfusion maps were calculated using RAPID software. RADIATION DOSE REDUCTION: This exam was performed according to the departmental dose-optimization program which includes automated exposure control, adjustment of the mA and/or kV according to patient size and/or use of iterative reconstruction technique. CONTRAST:  OMNIPAQUE  IOHEXOL  350 MG/ML SOLN COMPARISON:  MR head without contrast 01/28/2023. CT angio head and neck 01/28/2023. FINDINGS: CTA NECK FINDINGS Aortic arch: A 3 vessel arch configuration is present. No atherosclerotic change, stenosis or aneurysm is present. No dissection is present. Right carotid system: The right common carotid artery is normal. Bifurcation is unremarkable. The cervical right ICA is normal. Left carotid system: The left common carotid artery is within normal limits. Bifurcation is unremarkable. The cervical left ICA is normal. Vertebral arteries: The vertebral arteries are codominant. Both vertebral arteries originate from the subclavian arteries without significant stenosis. No significant stenosis is present in either vertebral artery in the neck. Skeleton: Vertebral body heights and alignment are normal. Mild straightening of the normal cervical lordosis is present. No focal osseous lesions are present Other neck: Soft tissues the neck are otherwise unremarkable.  Salivary glands are within normal limits. Thyroid  is normal. No significant adenopathy is present. No focal mucosal or submucosal lesions are present. Upper chest: The lung apices are clear. The thoracic inlet is within normal limits. Review of the MIP images confirms the above findings CTA HEAD FINDINGS Anterior circulation: Internal carotid arteries are within normal limits the skull base to the ICA termini. The A1 and M1 segments are normal. The MCA bifurcations are within normal limits bilaterally. The ACA and MCA branch vessels are normal. No aneurysm is present. Posterior circulation: The PICA origins are visualized and normal. Vertebrobasilar junction basilar artery are normal. The superior cerebellar arteries are patent. The left posterior cerebral artery originates from the basilar tip. The right posterior cerebral artery is of fetal type. The PCA branch vessels are normal bilaterally. Venous sinuses: The dural sinuses are patent. The straight sinus and deep cerebral veins are intact. Cortical veins are within normal limits. No significant vascular malformation is evident. Anatomic variants: None Review of the MIP images confirms the above findings CT Brain Perfusion Findings: ASPECTS: 10/10 CBF (<30%) Volume: 0mL Perfusion (Tmax>6.0s) volume: 0mL Mismatch Volume: 0mL IMPRESSION: 1. Normal CTA of the head and neck. No significant proximal stenosis, aneurysm, or branch vessel occlusion. 2. Normal CT perfusion. Electronically Signed   By: Lonni Necessary M.D.   On: 04/24/2023 18:46   CT HEAD CODE STROKE WO CONTRAST Result Date: 04/24/2023 CLINICAL DATA:  Code stroke. Neuro deficit, acute, stroke suspected. Aphasia, slurred speech and facial droop. Dizziness. Last known well at 5:15 p.m. EXAM: CT HEAD WITHOUT  CONTRAST TECHNIQUE: Contiguous axial images were obtained from the base of the skull through the vertex without intravenous contrast. RADIATION DOSE REDUCTION: This exam was performed according to  the departmental dose-optimization program which includes automated exposure control, adjustment of the mA and/or kV according to patient size and/or use of iterative reconstruction technique. COMPARISON:  MR head without contrast 01/28/2023 FINDINGS: Brain: No acute infarct, hemorrhage, or mass lesion is present. No significant white matter lesions are present. Deep brain nuclei are within normal limits. The ventricles are of normal size. No significant extraaxial fluid collection is present. The brainstem and cerebellum are within normal limits. Midline structures are within normal limits. Vascular: No hyperdense vessel or unexpected calcification. Skull: Insert normal skull No significant extracranial soft tissue lesion is present. Sinuses/Orbits: The left maxillary sinus is opacified. Secretions are present in the left sphenoid sinus. The paranasal sinuses and mastoid air cells are otherwise clear. ASPECTS [facility] Stroke Program Early CT Score) - Ganglionic level infarction (caudate, lentiform nuclei, internal capsule, insula, M1-M3 cortex): 7/7 - Supraganglionic infarction (M4-M6 cortex): 3/3 Total score (0-10 with 10 being normal): 10/10 IMPRESSION: 1. Normal CT appearance of the brain. 2. Aspects is 10/10. 3. Left maxillary and sphenoid sinus disease. Electronically Signed   By: Lonni Necessary M.D.   On: 04/24/2023 18:23    Vitals:   04/25/23 0430 04/25/23 0500 04/25/23 0600 04/25/23 0700  BP: 111/61 107/65 109/85 138/72  Pulse: (!) 46 (!) 49 (!) 53 74  Resp: 13 13 12  (!) 24  Temp:      TempSrc:      SpO2: 94% 94% 93% 96%  Weight:      Height:       PHYSICAL EXAM General:  Alert, well-nourished, well-developed Caucasian male in no acute distress Psych:  Mood and affect appropriate for situation CV: Regular rate and rhythm on monitor, sinus rhythm,  rate in the 60s Respiratory:  Regular, unlabored respirations on room air GI: Abdomen soft and nontender  NEURO:  Mental Status:  AA&Ox3, patient is able to give clear and coherent history Speech/Language: speech is mildly dysarthric in the setting of poor dentition.  No aphasia.  Naming, repetition, fluency, and comprehension intact.  Cranial Nerves:  II: PERRL. Visual fields full.  III, IV, VI: EOMI. Eyelids elevate symmetrically.  V: Patient reports of pins-and-needles tingling sensation on the left face. VII: Left mouth asymmetry, volitional appearing weakness.  Symmetric puffing of cheeks to command. VIII: Hearing intact to voice. IX, X: Palate elevates symmetrically. Phonation is normal.  XI: Head is grossly midline. XII: Tongue protrudes rightward Motor: 5/5 strength on the right upper and lower extremity.  Patient states that he is unable to move the left upper and lower extremity as he cannot feel them.  Though while talking, patient's left shoulder tends to elevate with emphasis.  Patient does not move them to command. Tone: is normal and bulk is normal Sensation: Complete numbness to the left upper and lower extremity reported with left facial pins-and-needles tingling sensation. Coordination: No ataxia on the right, unable to perform on the left due to weakness. Gait: Deferred for patient safety  ASSESSMENT/PLAN Acute ischemic stroke versus possible functional neurologic disorder as some findings appear non-organic on exam.  Etiology: Workup pending CT head: Normal CT appearance of the brain.  Aspects 10.  Left maxillary and sphenoid sinus disease. Repeat CT head no acute findings s/p TNK. CTA head & neck with CT perfusion: Normal CTA of the head and neck.  No  significant proximal stenosis, aneurysm, or branch vessel occlusion.  Normal CT perfusion. MRI  pending 2D Echo pending LDL 116 HgbA1c 5.2 VTE prophylaxis - SCDs due to TNK Eliquis  (apixaban ) daily (not taken for 3 weeks PTA due to incarceration, not given his medications in jail) prior to admission, now on No antithrombotic for 24 hours post-TNK.    Therapy recommendations:  Pending Disposition:  pending   Hx of Stroke/TIA Repeated presentations with transient left flaccid hemiparesis and dysarthria felt to be psychogenic weakness versus complex migraine headache Patient reported history of strokes, no imaging evidence of chronic strokes  Presentation in July 2021 with consistent symptoms of left flaccid hemiparesis, dysarthria, and left facial weakness that was noted to be volitional  MRI negative 10/14/2019 and patient presentation was felt to be psychogenic weakness versus complex migraine headache Presented to [facility] with left-sided weakness and aphasia 01/04/2023 and MRI negative for acute process again with volitional facial weakness and left hemiparesis Presentation to [facility] with left-sided weakness, left facial droop, and dysarthria again 01/28/2023 with non-organic findings including Hoover's sign, subjective hemisensory loss with reaction to noxious stimuli, and fluctuating dysarthria and facial weakness. MRI brain negative at this time.  November 2020 presented to [facility] with left-sided weakness and numbness and was given TPA. Documentation during this admission concerning for non-organic etiology/malingering Repeat presentations to [facility] 02/20/2019, 03/04/2019, and 09/30/2020 all with fluctuating symptoms and negative imaging. Presented again 05/09/21 for [facility] with left hemibody weakness and numbness with findings suggestive of functional etiology of presentation.  Presented to [facility] 6/21 with complaints and findings consistent with previous presentations.  [facility] health August 2024, September 2024, [place] June 2024 with similar reported symptoms. Previously unable to obtain MRI imaging due to bladder stimulator though all CTH and CT angiography imaging has been unremarkable during previous evaluations.   Atrial fibrillation  Home Meds: Eliquis  Eliquis  initiated between August and September of 2024 without documentation for review from  prescribing provider on [facility] Continue telemetry monitoring No anticoagulation for 24 hours post-TNK  Hypertension Home meds:  Prazosin  (neurogenic bladder) Stable Blood Pressure Goal: SBP 120-160 for first 24 hours then less than 180   Hyperlipidemia Home meds:  atorvastatin  80 mg, resumed in hospital LDL 116, goal < 70 Add Zetia  daily Continue statin at discharge  Tobacco Abuse Patient smokes 0.5 packs per day for many years      Ready to quit? No  Substance Abuse Patient uses [facility] UDS positive for  [facility]       Ready to quit? N/A TOC consult for cessation placed  Other Stroke Risk Factors   Other Active Problems PTSD/anxiety on home sertraline  Spinal cord injury with neurogenic bladder on home Prazosin  Chronic pain, on home gabapentin  Recurring headache Tramadol  given overnight with resolution of headache Patient complaining of right posterior pulsating headache again in the afternoon of 2/8 not relieved by Tylenol  One time dose of Zanaflex  ordered   Hospital day # 1  Mimi Ny, AGACNP-BC [facility] Pager: [phone]  ATTENDING ATTESTATION:  8-year-old with left-sided weakness which appears to be pretty dense on exam and apical portion.  He says he has a history of atrial fibrillation at some point put on Eliquis  by the emergency department.  He has had several visits to ED for strokelike symptoms and weakness.  Subjective history of atrial fibrillation not a clear documentation.  Currently he is off Eliquis .  Will review his MRI before deciding on antiplatelet or anticoagulation.  Discussed with MRI will  be definitive given his level of weakness on exam but he is a stroke or not.  He also complains of pain was given Ultram  overnight however this would not be given further.  Tylenol  did not help we will do trial of Zanaflex  but would not escalate any further as he appears to be comfortable during exam today.  Will transfer to the ICU if MRI is  unremarkable.  Dr. Nichola evaluated pt independently, reviewed imaging, chart, labs. Discussed and formulated plan with the Resident/APP. Changes were made to the note where appropriate. Please see APP/resident note above for details.      This patient is critically ill due to stroke s/p tPA and at significant risk of neurological worsening, death form heart failure, respiratory failure, recurrent stroke, bleeding from [facility], seizure, sepsis. This patient's care requires constant monitoring of vital signs, hemodynamics, respiratory and cardiac monitoring, review of multiple databases, neurological assessment, discussion with family, other specialists and medical decision making of high complexity. I spent 35 minutes of neurocritical care time in the care of this patient.   Miner Koral,MD    To contact Stroke Continuity provider, please refer to [email or web address]. After hours, contact General Neurology

## 2023-04-25 NOTE — Progress Notes (Signed)
 PT Cancellation Note  Patient Details Name: Matthew Armstrong. MRN: [id number] DOB: [birth year]   Cancelled Treatment:    Reason Eval/Treat Not Completed: Active bedrest order. Pt with a strict bedrest order after TNK administration yesterday. PT will follow up once off bedrest.   Bernardino JINNY Ruth 04/25/2023, 1:28 PM

## 2023-04-25 NOTE — Plan of Care (Signed)
  Problem: Ischemic Stroke/TIA Tissue Perfusion: Goal: Complications of ischemic stroke/TIA will be minimized Outcome: Progressing   Problem: Coping: Goal: Will verbalize positive feelings about self Outcome: Progressing Goal: Will identify appropriate support needs Outcome: Progressing   Problem: Health Behavior/Discharge Planning: Goal: Goals will be collaboratively established with patient/family Outcome: Progressing   Problem: Self-Care: Goal: Ability to participate in self-care as condition permits will improve Outcome: Progressing Goal: Ability to communicate needs accurately will improve Outcome: Progressing   Problem: Nutrition: Goal: Risk of aspiration will decrease Outcome: Progressing Goal: Dietary intake will improve Outcome: Progressing   Problem: Clinical Measurements: Goal: Ability to maintain clinical measurements within normal limits will improve Outcome: Progressing Goal: Cardiovascular complication will be avoided Outcome: Progressing   Problem: Nutrition: Goal: Adequate nutrition will be maintained Outcome: Progressing   Problem: Coping: Goal: Level of anxiety will decrease Outcome: Progressing   Problem: Elimination: Goal: Will not experience complications related to urinary retention Outcome: Progressing   Problem: Pain Managment: Goal: General experience of comfort will improve and/or be controlled Outcome: Progressing

## 2023-04-25 NOTE — Evaluation (Signed)
 Speech Language Pathology Evaluation Patient Details Name: Matthew Armstrong. 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 male with PMH: a-fib, HTN, anxiety, spinal cord injury with neurogenic bladder, prior CVA's, functional neurological symptoms disorder, PTSD, chronic pain. He presented to the hospital via EMS for acute onset of slurred speech, aphasia, facial droop, dizziness and left sided weakness. CT head negative for acute process, CTA head and neck normal, TNK administered and patient transferred to Neuro ICU. MRI pending.   Assessment / Plan / Recommendation Clinical Impression  Patient presents with a mild neurocognitive disorder and a mild-moderate dysarthria as per this evaluation. Although patient denies prior speech impairment, SLP reviewed his medical chart and speech impairment/dysarthria  and left sided weakness were documented as far back as 2022. Patient participated in completing the SLUMS examination and his score of 26 out of 30 places him in the scoring category of Mild Neurocognitive Disorder (scores 21-26). He also exhibits a dysarthria although some aspects of his speech impairment do not seem consistent with a dysarthria primarily  from left sided facial, bilabial weakness and decreased ROM. He did not exhibit any movement or sensation left side of face, no left sided bilabial movement. When asked to protrude tongue, he did not extend tongue tip much beyond his lips. There was slight deviation of tongue to the right but lingual ROM was only mildly reduced. At word level and during connected speech, patient with speech articulation errors such as with vocalic /r/ which did reduce overall intelligiblity when context was not known. SLP will follow patient while admitted and focus on continued intervention and assessment of his speech production and higher level cognition.    SLP Assessment  SLP Recommendation/Assessment: Patient needs continued Speech Lanaguage Pathology Services SLP Visit Diagnosis: Dysarthria and anarthria (R47.1);Cognitive communication deficit (R41.841)    Recommendations for follow up therapy are one component of a multi-disciplinary discharge planning process, led by the attending physician.  Recommendations may be updated based on patient status, additional functional criteria and insurance authorization.    Follow Up Recommendations  Other (comment) (TBD)    Assistance Recommended at Discharge  PRN  Functional Status Assessment Patient has had a recent decline in their functional status and demonstrates the ability to make significant improvements in function in a reasonable and predictable amount of time.  Frequency and Duration min 1 x/week  1 week      SLP Evaluation Cognition  Overall Cognitive Status: No family/caregiver present to determine baseline cognitive functioning Arousal/Alertness: Awake/alert Orientation Level: Oriented X4 Year: 2025 Month: February Day of Week: Correct Attention: Sustained Sustained Attention: Appears intact Memory: Appears intact Awareness: Impaired Awareness Impairment: Emergent impairment Problem Solving: Appears intact Safety/Judgment: Appears intact  Comprehension  Auditory Comprehension Overall Auditory Comprehension: Appears within functional limits for tasks assessed Yes/No Questions: Within Functional Limits Commands: Within Functional Limits    Expression Expression Primary Mode of Expression: Verbal Verbal Expression Overall Verbal Expression: Appears within functional limits for tasks assessed Initiation: No impairment Repetition: No impairment Naming: No impairment   Oral / Motor  Oral Motor/Sensory Function Overall Oral Motor/Sensory Function: Moderate impairment Facial ROM: Reduced left Facial Symmetry: Abnormal symmetry left Facial Strength: Reduced left Facial Sensation: Reduced left Lingual ROM: Reduced left Lingual Symmetry: Abnormal symmetry right Mandible: Impaired Motor Speech Overall Motor Speech: Impaired Respiration: Within functional limits Phonation: Normal Resonance: Within functional limits Articulation: Impaired Level of Impairment: Phrase Intelligibility: Intelligibility reduced Word: 75-100% accurate Phrase: 50-74% accurate Sentence: 50-74% accurate Conversation: 50-74% accurate Motor Planning: Witnin functional limits Motor Speech Errors: Not applicable Interfering Components: Premorbid status Effective Techniques: Slow rate;Increased vocal intensity;Over-articulate           Norleen IVAR Blase, MA, CCC-SLP Speech Therapy

## 2023-04-26 DIAGNOSIS — R531 Weakness: Secondary | ICD-10-CM | POA: Diagnosis not present

## 2023-04-26 MED ORDER — ASPIRIN 81 MG PO CHEW: 81.0000 mg | CHEWABLE_TABLET | Freq: Every day | ORAL | 1 refills | Status: DC

## 2023-04-26 MED ORDER — PRAZOSIN HCL 2 MG PO CAPS: 2.0000 mg | ORAL_CAPSULE | Freq: Every day | ORAL | 1 refills | Status: DC

## 2023-04-26 MED ORDER — SERTRALINE HCL 50 MG PO TABS: 50.0000 mg | ORAL_TABLET | Freq: Every day | ORAL | 1 refills | Status: DC

## 2023-04-26 MED ORDER — ALBUTEROL SULFATE HFA 108 (90 BASE) MCG/ACT IN AERS: 1.0000 | INHALATION_SPRAY | RESPIRATORY_TRACT | 1 refills | Status: DC | PRN

## 2023-04-26 MED ORDER — GABAPENTIN 300 MG PO CAPS: 300.0000 mg | ORAL_CAPSULE | Freq: Three times a day (TID) | ORAL | 1 refills | Status: DC

## 2023-04-26 MED ORDER — FAMOTIDINE 20 MG PO TABS: 20.0000 mg | ORAL_TABLET | Freq: Every day | ORAL | 1 refills | Status: DC

## 2023-04-26 MED ORDER — ATORVASTATIN CALCIUM 80 MG PO TABS: 80.0000 mg | ORAL_TABLET | Freq: Every day | ORAL | 1 refills | Status: DC

## 2023-04-26 NOTE — Evaluation (Signed)
 Occupational Therapy Evaluation Patient Details Name: Matthew Armstrong. MRN: [id number] DOB: 1980-06-28 Today's Date: 04/26/2023   History of Present Illness 43 y.o. male presents to [facility] hospital on 04/24/2023 with slurred speech, facial droop, dizziness, L weakness and aphasia. CT and CTA negative. Pt received TNK. PMH includes afib, HTN, anxiety, SCI with neurogenic bladder, CVA, PTSD, chronic pain.   Clinical Impression   PT admitted with L side weakness. Pt currently with functional limitiations due to the deficits listed below (see OT problem list). Pt without any speech deficits, oriented x4 and inconsistent L side weakness throughout session. Functional task to evaluate L UE weakness.  Pt will benefit from skilled OT to increase their independence and safety with adls and balance to allow discharge HHOT. Pt has stairs to enter the house and a garden tub. Recommendation for [facility] to stand and sit to transfer into the house. Pt reports the house is too tight and small for w/c inside.         If plan is discharge home, recommend the following: A little help with walking and/or transfers;A little help with bathing/dressing/bathroom    Functional Status Assessment  Patient has had a recent decline in their functional status and demonstrates the ability to make significant improvements in function in a reasonable and predictable amount of time.  Equipment Recommendations  Wheelchair (measurements OT);Wheelchair cushion (measurements OT);BSC/3in1 (likely to need to chair bump into the house)    Recommendations for Other Services       Precautions / Restrictions Precautions Precautions: Fall Restrictions Weight Bearing Restrictions Per Provider Order: No      Mobility Bed Mobility                    Transfers Overall transfer level: Needs assistance Equipment used: 2 person hand held assist Transfers: Sit to/from Stand, Bed to chair/wheelchair/BSC Sit to Stand: +2 physical  assistance, Min assist Stand pivot transfers: +2 physical assistance, Min assist         General transfer comment: pt with therapist positioning L LE to neutral position to prevent weight bearing on outside of foot      Balance Overall balance assessment: Needs assistance Sitting-balance support: Feet supported Sitting balance-Leahy Scale: Normal     Standing balance support: Bilateral upper extremity supported, During functional activity, Reliant on assistive device for balance Standing balance-Leahy Scale: Poor                             ADL either performed or assessed with clinical judgement   ADL Overall ADL's : Needs assistance/impaired Eating/Feeding: Modified independent   Grooming: Wash/dry face;Modified independent                   Toilet Transfer: +2 for physical assistance;Minimal assistance                   Vision Baseline Vision/History: 1 Wears glasses Vision Assessment?:  (glasses for distance)     Perception         Praxis         Pertinent Vitals/Pain Pain Assessment Pain Assessment: 0-10 Pain Score: 7  Pain Location: back R side of head Pain Descriptors / Indicators: Headache Pain Intervention(s): Monitored during session, Repositioned     Extremity/Trunk Assessment Upper Extremity Assessment Upper Extremity Assessment: LUE deficits/detail;Right hand dominant LUE Deficits / Details: 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 pt reports that fingers are just drawing up. Pt with  activation of shoulder flexion noted with positioning for pillow. pt noted to have activation of digit extension and flexion with positioning of hand for funcation contracture management. OT verbalized no we have to have your fingers straight and pt initiated extension wihtout use of R UE in that moment then responded with R UE. pt observed with first stand only to have a full  L UE tremor that resolved and did not occur at any point of time for the remainder for session LUE Sensation: decreased light touch LUE Coordination: decreased fine motor;decreased gross motor   Lower Extremity Assessment Lower Extremity Assessment: Defer to PT evaluation;LLE deficits/detail LLE Deficits / Details: pt observed at EOB with quad activation and leg in full extension off the eOB with foot supination and toes curled. pt reports numbness in the foot   Cervical / Trunk Assessment Cervical / Trunk Assessment: Normal;Other exceptions (pt noted to have L shoulder depression with L UE no moving . pt asked to shrug and noted to have bil shoulder elevation. pt asked to sit tall and noted to have scapula retraction)   Communication Communication Communication: No apparent difficulties   Cognition Arousal: Alert Behavior During Therapy: WFL for tasks assessed/performed Overall Cognitive Status: Within Functional Limits for tasks assessed                                       General Comments  VSS on RA    Exercises     Shoulder Instructions      Home Living Family/patient expects to be discharged to:: Private residence Living Arrangements: Spouse/significant other;Children Available Help at Discharge: Family;Available 24 hours/day (3 yo,mother in law,  mother in law boyfriend and nephew) Type of Home: Mobile home Home Access: Stairs to enter Secretary/administrator of Steps: 4-5 Entrance Stairs-Rails: Left;Right;Can reach both Home Layout: One level     Bathroom Shower/Tub: Chief Strategy Officer: Standard     Home Equipment: Rollator (4 wheels)          Prior Functioning/Environment Prior Level of Function : Independent/Modified Independent             Mobility Comments: walks in the house indep, out in community uses rollator ADLs Comments: wife helps patient dress, help transfer into garden tub, is able to complete indep self  cath when needed        OT Problem List: Impaired balance (sitting and/or standing);Decreased range of motion;Decreased knowledge of use of DME or AE;Decreased knowledge of precautions;Decreased safety awareness;Impaired UE functional use      OT Treatment/Interventions:      OT Goals(Current goals can be found in the care plan section) Acute Rehab OT Goals Patient Stated Goal: to get a shower in the shower OT Goal Formulation: With patient Time For Goal Achievement: 05/10/23 Potential to Achieve Goals: Good  OT Frequency:      Co-evaluation PT/OT/SLP Co-Evaluation/Treatment: Yes Reason for Co-Treatment: For patient/therapist safety;To address functional/ADL transfers   OT goals addressed during session: ADL's and self-care;Strengthening/ROM      AM-PAC OT 6 Clicks Daily Activity     Outcome Measure Help from another person eating meals?: A Little Help from another person taking care of personal grooming?: A Little Help from another person toileting, which includes using toliet, bedpan, or urinal?:  A Little Help from another person bathing (including washing, rinsing, drying)?: A Little Help from another person to put on and taking off regular upper body clothing?: A Little Help from another person to put on and taking off regular lower body clothing?: A Little 6 Click Score: 18   End of Session Equipment Utilized During Treatment: Gait belt Nurse Communication: Mobility status;Precautions  Activity Tolerance: Patient tolerated treatment well Patient left: in chair;with call bell/phone within reach;with bed alarm set  OT Visit Diagnosis: Unsteadiness on feet (R26.81)                Time: 9144-9080 OT Time Calculation (min): 24 min Charges:  OT General Charges $OT Visit: 1 Visit OT Evaluation $OT Eval Moderate Complexity: 1 Mod   Brynn, OTR/L  Acute Rehabilitation Services Office: [phone] .   Ely Molt 04/26/2023, 9:35 AM

## 2023-04-26 NOTE — Discharge Summary (Addendum)
 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 on exam   Patient Active Problem List   Diagnosis Date Noted   Stroke (cerebrum) (HCC) 04/24/2023   Left-sided weakness 01/29/2023   Lumbosacral disc herniation 01/29/2023   AMS (altered mental status) 01/28/2023   Major depressive disorder with current active episode 01/07/2023   Poor social situation 01/06/2023   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   albuterol  108 (90 Base) MCG/ACT inhaler Commonly known as: VENTOLIN  HFA Inhale 1-2 puffs into the lungs as needed for wheezing or shortness of breath.   aspirin  81 MG chewable tablet Chew 1 tablet (81 mg total) by mouth daily.   atorvastatin  80 MG tablet Commonly known as: LIPITOR  Take 1 tablet (80 mg total) by mouth daily. What changed: when to take this   famotidine  20 MG tablet Commonly known as: PEPCID  Take 1 tablet (20 mg total) by mouth daily.   gabapentin  300 MG capsule Commonly known as: NEURONTIN  Take 1 capsule (300 mg total) by mouth 3 (three) times daily.   prazosin  2 MG capsule Commonly known as: MINIPRESS  Take 1 capsule (2 mg total) by mouth  at bedtime.   sertraline  50 MG tablet Commonly known as: ZOLOFT  Take 1 tablet (50 mg total) by mouth daily.        LABORATORY STUDIES CBC    Component Value Date/Time   WBC 6.9 04/25/2023 0419   RBC 4.92 04/25/2023 0419   HGB 15.2 04/25/2023 0419   HCT 44.2 04/25/2023 0419   PLT 283 04/25/2023 0419   MCV 89.8 04/25/2023 0419   MCH 30.9 04/25/2023 0419   MCHC 34.4 04/25/2023 0419   RDW 13.2 04/25/2023 0419   LYMPHSABS 2.4 04/24/2023 1808   MONOABS 0.5 04/24/2023 1808   EOSABS 0.1 04/24/2023 1808   BASOSABS 0.0 04/24/2023 1808   CMP    Component Value Date/Time   NA 139 04/25/2023 0419   K 3.6 04/25/2023 0419   CL 97 (L) 04/25/2023 0419   CO2 27 04/25/2023 0419   GLUCOSE 85 04/25/2023 0419   BUN 10 04/25/2023 0419   CREATININE 1.24 04/25/2023 0419   CALCIUM  9.6 04/25/2023 0419   PROT 7.3 04/24/2023 1808   ALBUMIN 4.3 04/24/2023 1808   AST 21 04/24/2023 1808   ALT 19 04/24/2023 1808   ALKPHOS 78 04/24/2023 1808   BILITOT 0.9 04/24/2023 1808   GFRNONAA >60 04/25/2023 0419   GFRAA >60 10/14/2019 2215   COAGS Lab Results  Component Value Date  INR 1.0 04/24/2023   INR 1.1 01/28/2023   INR 0.9 01/04/2023   Lipid Panel    Component Value Date/Time   CHOL 175 04/25/2023 0419   TRIG 108 04/25/2023 0419   HDL 37 (L) 04/25/2023 0419   CHOLHDL 4.7 04/25/2023 0419   VLDL 22 04/25/2023 0419   LDLCALC 116 (H) 04/25/2023 0419   HgbA1C  Lab Results  Component Value Date   HGBA1C 5.2 04/24/2023   Alcohol Level    Component Value Date/Time   ETH <10 04/24/2023 1808     SIGNIFICANT DIAGNOSTIC STUDIES MR BRAIN WO CONTRAST Result Date: 04/25/2023 CLINICAL DATA:  Stroke, follow-up. Aphasia, slurred speech and facial droop. Dizziness. Left-sided weakness. EXAM: MRI HEAD WITHOUT CONTRAST TECHNIQUE: Multiplanar, multiecho pulse sequences of the brain and surrounding structures were obtained without intravenous contrast. COMPARISON:  CT head without contrast  04/24/2023. MR head without contrast 01/28/2023 FINDINGS: Brain: No acute infarct, hemorrhage, or mass lesion is present. No significant white matter lesions are present. Deep brain nuclei are within normal limits. The ventricles are of normal size. No significant extraaxial fluid collection is present. Vascular: Flow is present in the major intracranial arteries. Skull and upper cervical spine: The craniocervical junction is normal. Upper cervical spine is within normal limits. Marrow signal is unremarkable. Sinuses/Orbits: The left sphenoid sinus and maxillary sinuses are opacified. The paranasal sinuses and mastoid air cells are otherwise clear. The globes and orbits are within normal limits. IMPRESSION: 1. Negative MRI appearance of the brain. No acute or focal lesion to explain the patient's symptoms. 2. Left sphenoid and maxillary sinus disease. Electronically Signed   By: Lonni Necessary M.D.   On: 04/25/2023 18:46   ECHOCARDIOGRAM COMPLETE Result Date: 04/25/2023    ECHOCARDIOGRAM REPORT   Patient Name:   Matthew Armstrong. Date of Exam: 04/25/2023 Medical Rec #:  [id number]          Height:       70.0 in Accession #:    [id number]         Weight:       171.3 lb Date of Birth:  09-07-1980         BSA:          1.954 m Patient Age:    43 years           BP:           138/72 mmHg Patient Gender: M                  HR:           58 bpm. Exam Location:  Inpatient Procedure: 2D Echo, Cardiac Doppler and Color Doppler Indications:    Stroke  History:        Patient has no prior history of Echocardiogram examinations.                 Arrythmias:Atrial Fibrillation.  Sonographer:    Jayson Gaskins Referring Phys: KARNA LABOR WOLFE IMPRESSIONS  1. Left ventricular ejection fraction, by estimation, is 60 to 65%. The left ventricle has normal function. The left ventricle has no regional wall motion abnormalities. Left ventricular diastolic parameters were normal.  2. Right ventricular systolic function is normal. The  right ventricular size is normal. Tricuspid regurgitation signal is inadequate for assessing PA pressure.  3. The mitral valve is grossly normal. Mild mitral valve regurgitation.  4. The aortic valve was not well visualized. Aortic valve regurgitation is not visualized. Aortic valve  mean gradient measures 3.0 mmHg.  5. Unable to estimate CVP. Comparison(s): No prior Echocardiogram. FINDINGS  Left Ventricle: Left ventricular ejection fraction, by estimation, is 60 to 65%. The left ventricle has normal function. The left ventricle has no regional wall motion abnormalities. The left ventricular internal cavity size was normal in size. There is  no left ventricular hypertrophy. Left ventricular diastolic parameters were normal. Right Ventricle: The right ventricular size is normal. No increase in right ventricular wall thickness. Right ventricular systolic function is normal. Tricuspid regurgitation signal is inadequate for assessing PA pressure. Left Atrium: Left atrial size was normal in size. Right Atrium: Right atrial size was normal in size. Pericardium: There is no evidence of pericardial effusion. Presence of epicardial fat layer. Mitral Valve: The mitral valve is grossly normal. Mild mitral valve regurgitation. Tricuspid Valve: The tricuspid valve is grossly normal. Tricuspid valve regurgitation is trivial. Aortic Valve: The aortic valve was not well visualized. Aortic valve regurgitation is not visualized. Aortic valve mean gradient measures 3.0 mmHg. Aortic valve peak gradient measures 4.8 mmHg. Aortic valve area, by VTI measures 2.95 cm. Pulmonic Valve: The pulmonic valve was grossly normal. Pulmonic valve regurgitation is trivial. Aorta: The aortic root and ascending aorta are structurally normal, with no evidence of dilitation. Venous: Unable to estimate CVP. The inferior vena cava was not well visualized. IAS/Shunts: No atrial level shunt detected by color flow Doppler.  LEFT VENTRICLE PLAX 2D LVIDd:          4.40 cm   Diastology LVIDs:         2.70 cm   LV e' medial:    13.10 cm/s LV PW:         0.90 cm   LV E/e' medial:  4.7 LV IVS:        0.70 cm   LV e' lateral:   19.30 cm/s LVOT diam:     2.00 cm   LV E/e' lateral: 3.2 LV SV:         67 LV SV Index:   34 LVOT Area:     3.14 cm  RIGHT VENTRICLE RV S prime:     14.80 cm/s TAPSE (M-mode): 2.1 cm LEFT ATRIUM           Index        RIGHT ATRIUM           Index LA Vol (A2C): 26.5 ml 13.56 ml/m  RA Area:     17.50 cm LA Vol (A4C): 26.1 ml 13.36 ml/m  RA Volume:   46.40 ml  23.74 ml/m  AORTIC VALVE AV Area (Vmax):    2.65 cm AV Area (Vmean):   3.05 cm AV Area (VTI):     2.95 cm AV Vmax:           109.00 cm/s AV Vmean:          77.100 cm/s AV VTI:            0.227 m AV Peak Grad:      4.8 mmHg AV Mean Grad:      3.0 mmHg LVOT Vmax:         91.80 cm/s LVOT Vmean:        74.900 cm/s LVOT VTI:          0.213 m LVOT/AV VTI ratio: 0.94  AORTA Ao Root diam: 3.10 cm MITRAL VALVE MV Area (PHT): 4.86 cm    SHUNTS MV Decel Time: 156 msec    Systemic VTI:  0.21 m MV E velocity: 61.80 cm/s  Systemic Diam: 2.00 cm MV A velocity: 36.70 cm/s MV E/A ratio:  1.68 Jayson Sierras MD Electronically signed by Jayson Sierras MD Signature Date/Time: 04/25/2023/2:58:01 PM    Final    CT HEAD WO CONTRAST ( ) Result Date: 04/24/2023 CLINICAL DATA:  Follow-up examination for stroke, headache status post TNK. EXAM: CT HEAD WITHOUT CONTRAST TECHNIQUE: Contiguous axial images were obtained from the base of the skull through the vertex without intravenous contrast. RADIATION DOSE REDUCTION: This exam was performed according to the departmental dose-optimization program which includes automated exposure control, adjustment of the mA and/or kV according to patient size and/or use of iterative reconstruction technique. COMPARISON:  Prior CT and CTA from earlier the same day. FINDINGS: Brain: Cerebral volume within normal limits. No acute intracranial hemorrhage seen status post TNK. No  convincing acute or evolving large vessel territory infarct. No mass lesion or midline shift. No hydrocephalus or extra-axial fluid collection. Vascular: Contrast material remains on board from prior CTA. Skull: Scalp soft tissues demonstrate no acute abnormality. Calvarium intact. Sinuses/Orbits: Globes and orbital soft tissues within normal limits. Chronic left sphenoid and maxillary sinusitis again noted. Mastoid air cells remain clear. Other: None. IMPRESSION: 1. No acute intracranial hemorrhage or other l abnormality status post TNK. No convincing acute or evolving large vessel territory infarct evident at this time. 2. Chronic left sphenoid and maxillary sinusitis. Electronically Signed   By: Morene Hoard M.D.   On: 04/24/2023 20:23   CT ANGIO HEAD NECK W WO CM W PERF (CODE STROKE) Result Date: 04/24/2023 CLINICAL DATA:  Neuro deficit, acute, stroke suspected. Left-sided deficits. EXAM: CT ANGIOGRAPHY HEAD AND NECK CT PERFUSION BRAIN TECHNIQUE: Multidetector CT imaging of the head and neck was performed using the standard protocol during bolus administration of intravenous contrast. Multiplanar CT image reconstructions and MIPs were obtained to evaluate the vascular anatomy. Carotid stenosis measurements (when applicable) are obtained utilizing NASCET criteria, using the distal internal carotid diameter as the denominator. Multiphase CT imaging of the brain was performed following IV bolus contrast injection. Subsequent parametric perfusion maps were calculated using RAPID software. RADIATION DOSE REDUCTION: This exam was performed according to the departmental dose-optimization program which includes automated exposure control, adjustment of the mA and/or kV according to patient size and/or use of iterative reconstruction technique. CONTRAST:  100mL OMNIPAQUE  IOHEXOL  350 MG/ML SOLN COMPARISON:  MR head without contrast 01/28/2023. CT angio head and neck 01/28/2023. FINDINGS: CTA NECK FINDINGS Aortic  arch: A 3 vessel arch configuration is present. No atherosclerotic change, stenosis or aneurysm is present. No dissection is present. Right carotid system: The right common carotid artery is normal. Bifurcation is unremarkable. The cervical right ICA is normal. Left carotid system: The left common carotid artery is within normal limits. Bifurcation is unremarkable. The cervical left ICA is normal. Vertebral arteries: The vertebral arteries are codominant. Both vertebral arteries originate from the subclavian arteries without significant stenosis. No significant stenosis is present in either vertebral artery in the neck. Skeleton: Vertebral body heights and alignment are normal. Mild straightening of the normal cervical lordosis is present. No focal osseous lesions are present Other neck: Soft tissues the neck are otherwise unremarkable. Salivary glands are within normal limits. Thyroid  is normal. No significant adenopathy is present. No focal mucosal or submucosal lesions are present. Upper chest: The lung apices are clear. The thoracic inlet is within normal limits. Review of the MIP images confirms the above findings CTA HEAD FINDINGS Anterior circulation: Internal  carotid arteries are within normal limits the skull base to the ICA termini. The A1 and M1 segments are normal. The MCA bifurcations are within normal limits bilaterally. The ACA and MCA branch vessels are normal. No aneurysm is present. Posterior circulation: The PICA origins are visualized and normal. Vertebrobasilar junction basilar artery are normal. The superior cerebellar arteries are patent. The left posterior cerebral artery originates from the basilar tip. The right posterior cerebral artery is of fetal type. The PCA branch vessels are normal bilaterally. Venous sinuses: The dural sinuses are patent. The straight sinus and deep cerebral veins are intact. Cortical veins are within normal limits. No significant vascular malformation is evident.  Anatomic variants: None Review of the MIP images confirms the above findings CT Brain Perfusion Findings: ASPECTS: 10/10 CBF (<30%) Volume: 0mL Perfusion (Tmax>6.0s) volume: 0mL Mismatch Volume: 0mL IMPRESSION: 1. Normal CTA of the head and neck. No significant proximal stenosis, aneurysm, or branch vessel occlusion. 2. Normal CT perfusion. Electronically Signed   By: Lonni Necessary M.D.   On: 04/24/2023 18:46   CT HEAD CODE STROKE WO CONTRAST Result Date: 04/24/2023 CLINICAL DATA:  Code stroke. Neuro deficit, acute, stroke suspected. Aphasia, slurred speech and facial droop. Dizziness. Last known well at 5:15 p.m. EXAM: CT HEAD WITHOUT CONTRAST TECHNIQUE: Contiguous axial images were obtained from the base of the skull through the vertex without intravenous contrast. RADIATION DOSE REDUCTION: This exam was performed according to the departmental dose-optimization program which includes automated exposure control, adjustment of the mA and/or kV according to patient size and/or use of iterative reconstruction technique. COMPARISON:  MR head without contrast 01/28/2023 FINDINGS: Brain: No acute infarct, hemorrhage, or mass lesion is present. No significant white matter lesions are present. Deep brain nuclei are within normal limits. The ventricles are of normal size. No significant extraaxial fluid collection is present. The brainstem and cerebellum are within normal limits. Midline structures are within normal limits. Vascular: No hyperdense vessel or unexpected calcification. Skull: Insert normal skull No significant extracranial soft tissue lesion is present. Sinuses/Orbits: The left maxillary sinus is opacified. Secretions are present in the left sphenoid sinus. The paranasal sinuses and mastoid air cells are otherwise clear. ASPECTS [facility] Stroke Program Early CT Score) - Ganglionic level infarction (caudate, lentiform nuclei, internal capsule, insula, M1-M3 cortex): 7/7 - Supraganglionic infarction  (M4-M6 cortex): 3/3 Total score (0-10 with 10 being normal): 10/10 IMPRESSION: 1. Normal CT appearance of the brain. 2. Aspects is 10/10. 3. Left maxillary and sphenoid sinus disease. Electronically Signed   By: Lonni Necessary M.D.   On: 04/24/2023 18:23       HISTORY OF PRESENT ILLNESS Mr. Doctor Sheahan. is a 43 y.o. male with history of AF not on Eliquis  for 3 weeks PTA, HTN, anxiety, spinal cord injury with neurogenic bladder, reported prior strokes, with similar presentation in July 2021 with left-sided weakness, facial droop, and dysarthria, functional neurologic symptims disorder, PTDS, and chronic pain presenting with acute onset of slurred speech, facial droop, dizziness, left-sided weakness, and aphasia s/p TNK administration.     HOSPITAL COURSE functional neurologic disorder  CT head: Normal CT appearance of the brain.  Aspects 10.  Left maxillary and sphenoid sinus disease. Repeat CT head no acute findings s/p TNK. CTA head & neck with CT perfusion: Normal CTA of the head and neck.  No significant proximal stenosis, aneurysm, or branch vessel occlusion.  Normal CT perfusion. MRI  Negative MRI appearance of the brain. No acute or focal lesion  to explain the patient's symptoms. Left sphenoid and maxillary sinus disease. 2D Echo EF 60-65% LDL 116 HgbA1c 5.2 VTE prophylaxis - SCDs due to TNK Eliquis  (apixaban ) daily (not taken for 3 weeks PTA due to incarceration, not given his medications in jail) prior to admission, now on No antithrombotic for 24 hours post-TNK.   Therapy recommendations:  Home health Disposition:  Home   Hx of Stroke/TIA Repeated presentations with transient left flaccid hemiparesis and dysarthria felt to be psychogenic weakness versus complex migraine headache Patient reported history of strokes, no imaging evidence of chronic strokes  Presentation in July 2021 with consistent symptoms of left flaccid hemiparesis, dysarthria, and left facial weakness  that was noted to be volitional  MRI negative 10/14/2019 and patient presentation was felt to be psychogenic weakness versus complex migraine headache Presented to [facility] with left-sided weakness and aphasia 01/04/2023 and MRI negative for acute process again with volitional facial weakness and left hemiparesis Presentation to [facility] with left-sided weakness, left facial droop, and dysarthria again 01/28/2023 with non-organic findings including Hoover's sign, subjective hemisensory loss with reaction to noxious stimuli, and fluctuating dysarthria and facial weakness. MRI brain negative at this time.  November 2020 presented to [facility] with left-sided weakness and numbness and was given TPA. Documentation during this admission concerning for non-organic etiology/malingering Repeat presentations to [facility] 02/20/2019, 03/04/2019, and 09/30/2020 all with fluctuating symptoms and negative imaging. Presented again 05/09/21 for [facility] with left hemibody weakness and numbness with findings suggestive of functional etiology of presentation.  Presented to [facility] 6/21 with complaints and findings consistent with previous presentations.  [facility] health August 2024, September 2024, [place] June 2024 with similar reported symptoms. Previously unable to obtain MRI imaging due to bladder stimulator though all CTH and CT angiography imaging has been unremarkable during previous evaluations.    Atrial fibrillation?  Home Meds: Eliquis  noncompliant  Eliquis  initiated between August and September of 2024 without documentation for review from prescribing provider on [facility]; no documented afib in [facility] or [place] chart review. We will NOT resume eliquis  at discharge  Continue telemetry monitoring   Hypertension Home meds:  Prazosin  (neurogenic bladder) Stable Blood Pressure Goal: SBP 120-160 for first 24 hours then less than 180    Hyperlipidemia Home meds:  atorvastatin  80 mg, resumed in hospital LDL 116, goal < 70 Add Zetia   daily Continue statin at discharge   Tobacco Abuse Patient smokes 0.5 packs per day for many years      Ready to quit? No   Substance Abuse Patient uses [facility] UDS positive for  [facility]       Ready to quit? N/A TOC consult for cessation placed   Other Active Problems PTSD/anxiety on home sertraline  Spinal cord injury with neurogenic bladder on home Prazosin  Chronic pain, on home gabapentin  Recurring headache Tramadol  given overnight with resolution of headache Patient complaining of right posterior pulsating headache again in the afternoon of 2/8 not relieved by Tylenol  One time dose of Zanaflex  ordered    DISCHARGE EXAM  PHYSICAL EXAM General:  Alert, well-nourished, well-developed patient in no acute distress Psych:  Mood and affect appropriate for situation CV: Regular rate and rhythm on monitor Respiratory:  Regular, unlabored respirations on room air GI: Abdomen soft and nontender  NEURO:  Mental Status: AA&Ox3  Speech/Language: speech is without dysarthria or aphasia.  Naming, repetition, fluency, and comprehension intact.  Cranial Nerves:  II: PERRL. Visual fields full.  III, IV, VI: EOMI. Eyelids elevate symmetrically.  V: Patient reports  of pins-and-needles tingling sensation on the left face. VII: Left mouth asymmetry, volitional appearing weakness.  Symmetric puffing of cheeks to command. VIII: Hearing intact to voice. IX, X: Palate elevates symmetrically. Phonation is normal.  XI: Head is grossly midline. XII: Tongue protrudes rightward Motor: 5/5 strength on the right upper and lower extremity.  Patient states that he is unable to move the left upper and lower extremity as he cannot feel them.  Though while talking, patient's left shoulder tends to elevate with emphasis.  Patient does not move them to command. Seen mvoing both left upper and lower extremity when alone in the room.  Tone: is normal and bulk is normal Sensation: Complete numbness to the left upper  and lower extremity reported with left facial pins-and-needles tingling sensation. Coordination: No ataxia on the right, unable to perform on the left due to weakness. Gait: Deferred for patient safety  1a Level of Conscious.: 0 1b LOC Questions: 0 1c LOC Commands: 0 2 Best Gaze: 0 3 Visual: 0 4 Facial Palsy: 1 5a Motor Arm - left: 2 5b Motor Arm - Right: 0 6a Motor Leg - Left: 3 6b Motor Leg - Right: 0 7 Limb Ataxia: 0 8 Sensory: 1 9 Best Language: 0 10 Dysarthria: 0 11 Extinct. and Inatten.: 1 TOTAL: 8   Discharge Diet       Diet   Diet regular Room service appropriate? Yes; Fluid consistency: Thin   liquids  DISCHARGE PLAN Disposition: Home with Home Health aspirin  81 mg daily for secondary stroke prevention  Ongoing stroke risk factor control by Primary Care Physician at time of discharge Follow-up PCP Pcp, No in 2 weeks.  30 minutes were spent preparing discharge.   Jorene Last, DNP, FNP-BC [facility] Pager: [phone]  ATTENDING ATTESTATION:  MRI was negative for stroke.  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  There was some question of wanting this to the prison for not giving him his Eliquis .  On chart review it appears there is no clear reason for him to be on Eliquis  and likely the multiple presentations to the ED somehow resulted in him getting Eliquis .  MRI again is negative no indication for Eliquis .  Cardiac monitoring during this hospitalization shows normal sinus rhythm.  If there is a concern this can be further explored outpatient however patient was started on aspirin  daily there is a note of allergies to aspirin  in the chart however this is not correct  since he tolerated well in the hospital here.  On exam he is able to move his left upper and lower extremity when he is distracted.  Yesterday he had a  significant facial droop that appeared for send today it is resolved.  Pharmacy and social work were consulted to ensure he gets his medications on time.  He is in agreement going home and family will come to pick him up in the afternoon.  Discussed with physical therapy and appear to have a functional exam that fluctuated.  He can follow-up with his primary care outpatient.  Dr. Nichola evaluated pt independently, reviewed imaging, chart, labs. Discussed and formulated plan with the Resident/APP. Changes were made to the note where appropriate. Please see APP/resident note above for details.   Total 36 minutes spent on counseling patient and coordinating care, writing notes and reviewing chart.    Varnell Donate,MD

## 2023-04-26 NOTE — TOC Transition Note (Signed)
 Transition of Care [facility]) - Discharge Note   Patient Details  Name: Matthew Armstrong. MRN: [id number] Date of Birth: August 24, 1980  Transition of Care [facility]) CM/SW Contact:  Robynn Eileen Hoose, RN Phone Number: 04/26/2023, 12:34 PM   Clinical Narrative:   Patient is being discharged today. Shelia with [facility] able to accept patient for [facility] PT/OT.    Final next level of care: Home w Home Health Services Barriers to Discharge: No Barriers Identified   Patient Goals and CMS Choice            Discharge Placement                       Discharge Plan and Services Additional resources added to the After Visit Summary for                            HH Arranged: PT, OT [facility] Agency: [facility] Date [facility] Agency Contacted: 04/26/23 Time HH Agency Contacted: 1107 Representative spoke with at [facility] Agency: Dorothe  Social Drivers of Health (SDOH) Interventions SDOH Screenings   Food Insecurity: No Food Insecurity (04/25/2023)  Housing: Low Risk  (04/25/2023)  Transportation Needs: No Transportation Needs (04/25/2023)  Utilities: Not At Risk (04/25/2023)  Financial Resource Strain: Low Risk  (09/14/2022)   Received from [facility]  Social Connections: Unknown (09/05/2022)   Received from [facility]  Tobacco Use: High Risk (04/24/2023)     Readmission Risk Interventions     No data to display

## 2023-04-26 NOTE — Evaluation (Signed)
 Physical Therapy Evaluation Patient Details Name: Matthew Armstrong. MRN: [id number] DOB: 11/04/1980 Today's Date: 04/26/2023  History of Present Illness  43 y.o. male presents to [facility] hospital on 04/24/2023 with slurred speech, facial droop, dizziness, L weakness and aphasia. CT and CTA negative. Pt received TNK. PMH includes afib, HTN, anxiety, SCI with neurogenic bladder, CVA, PTSD, chronic pain.   Clinical Impression  Pt admitted with above diagnosis. PTA pt lived at home with family, I/mod I mobility and ADLs, using rollator in community. Pt currently with functional limitations due to the deficits listed below (see PT Problem List). On eval, pt required supervision bed mobility, +2 min assist sit to stand, and +2 min assist SPT. Inconsistencies noted in LUE/LE. Pt reporting flaccidity at rest, and increased tone/spasms with mobility. Active movement noted, however, during functional mobility. Normal tone with PROM LLE. Pt will benefit from acute skilled PT to increase their independence and safety with mobility to allow discharge. Upon d/c, recommend HHPT and w/c.          If plan is discharge home, recommend the following: Assist for transportation;Assistance with cooking/housework;A little help with bathing/dressing/bathroom;A little help with walking and/or transfers;Help with stairs or ramp for entrance   Can travel by private vehicle        Equipment Recommendations Wheelchair (measurements PT);Wheelchair cushion (measurements PT)  Recommendations for Other Services       Functional Status Assessment Patient has had a recent decline in their functional status and demonstrates the ability to make significant improvements in function in a reasonable and predictable amount of time.     Precautions / Restrictions Precautions Precautions: Fall Restrictions Weight Bearing Restrictions Per Provider Order: No      Mobility  Bed Mobility Overal bed mobility: Needs Assistance Bed  Mobility: Supine to Sit     Supine to sit: Supervision, HOB elevated, Used rails     General bed mobility comments: increased time, no physical assist    Transfers Overall transfer level: Needs assistance Equipment used: 2 person hand held assist Transfers: Sit to/from Stand, Bed to chair/wheelchair/BSC Sit to Stand: +2 physical assistance, Min assist Stand pivot transfers: +2 physical assistance, Min assist         General transfer comment: STS from EOB x 2 trials. SPT bed to recliner toward R. No LOB or knee buckling noted.    Ambulation/Gait                  Stairs            Wheelchair Mobility     Tilt Bed    Modified Rankin (Stroke Patients Only)       Balance Overall balance assessment: Needs assistance Sitting-balance support: Feet supported, No upper extremity supported Sitting balance-Leahy Scale: Normal     Standing balance support: Single extremity supported, During functional activity Standing balance-Leahy Scale: Poor Standing balance comment: reliant on external support                             Pertinent Vitals/Pain Pain Assessment Pain Assessment: 0-10 Pain Score: 7  Pain Location: back R side of head Pain Descriptors / Indicators: Headache Pain Intervention(s): Monitored during session, Repositioned    Home Living Family/patient expects to be discharged to:: Private residence Living Arrangements: Spouse/significant other;Children Available Help at Discharge: Family;Available 24 hours/day (3 yo,mother in law, mother in law boyfriend and nephew) Type of Home: Mobile home Home Access: Stairs  to enter Entrance Stairs-Rails: Left;Right;Can reach both Entrance Stairs-Number of Steps: 4-5   Home Layout: One level Home Equipment: Rollator (4 wheels)      Prior Function Prior Level of Function : Independent/Modified Independent             Mobility Comments: walks in the house indep, out in community uses  rollator ADLs Comments: wife helps patient dress, help transfer into garden tub, is able to complete indep self cath when needed     Extremity/Trunk Assessment   Upper Extremity Assessment Upper Extremity Assessment: Defer to OT evaluation;Right hand dominant LUE Deficits / Details: 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 pt reports that fingers are just drawing up. Pt with  activation of shoulder flexion noted with positioning for pillow. pt noted to have activation of digit extension and flexion with positioning of hand for funcation contracture management. OT verbalized no we have to have your fingers straight and pt initiated extension wihtout use of R UE in that moment then responded with R UE. pt observed with first stand only to have a full L UE tremor that resolved and did not occur at any point of time for the remainder for session LUE Sensation: decreased light touch LUE Coordination: decreased fine motor;decreased gross motor    Lower Extremity Assessment Lower Extremity Assessment: LLE deficits/detail LLE Deficits / Details: inconsistencies noted with pt's strength report/ability to complete MMT and movement noted during functional mobility. Pt holding L foot in supination and toes flexed during stand. Therapist easily able to move back to neutral position with no abnormal tone noted. LLE Sensation: decreased light touch (reports pins and needles in thigh and calf. Reports numbness in foot.)    Cervical / Trunk Assessment Cervical / Trunk Assessment: Normal  Communication   Communication Communication: No apparent difficulties  Cognition Arousal: Alert Behavior During Therapy: WFL for tasks assessed/performed Overall Cognitive Status: Within Functional Limits for tasks assessed                                          General Comments General comments (skin integrity, edema, etc.): VSS  on RA    Exercises     Assessment/Plan    PT Assessment Patient needs continued PT services  PT Problem List Decreased mobility;Decreased balance;Decreased knowledge of use of DME       PT Treatment Interventions DME instruction;Functional mobility training;Balance training;Patient/family education;Gait training;Therapeutic activities;Stair training;Therapeutic exercise    PT Goals (Current goals can be found in the Care Plan section)  Acute Rehab PT Goals Patient Stated Goal: not stated PT Goal Formulation: With patient Time For Goal Achievement: 05/10/23 Potential to Achieve Goals: Good    Frequency Min 1X/week     Co-evaluation PT/OT/SLP Co-Evaluation/Treatment: Yes Reason for Co-Treatment: For patient/therapist safety;To address functional/ADL transfers PT goals addressed during session: Mobility/safety with mobility;Balance OT goals addressed during session: ADL's and self-care;Strengthening/ROM       AM-PAC PT 6 Clicks Mobility  Outcome Measure Help needed turning from your back to your side while in a flat bed without using bedrails?: None Help needed moving from lying on your back to sitting on the side of a flat bed without using bedrails?: A Little Help needed moving to and from a bed to a chair (including a wheelchair)?: A Little Help needed  standing up from a chair using your arms (e.g., wheelchair or bedside chair)?: A Little Help needed to walk in hospital room?: Total Help needed climbing 3-5 steps with a railing? : Total 6 Click Score: 15    End of Session Equipment Utilized During Treatment: Gait belt Activity Tolerance: Patient tolerated treatment well Patient left: in chair;with call bell/phone within reach;with chair alarm set Nurse Communication: Mobility status PT Visit Diagnosis: Other abnormalities of gait and mobility (R26.89)    Time: 9143-9081 PT Time Calculation (min) (ACUTE ONLY): 22 min   Charges:   PT Evaluation $PT Eval  Moderate Complexity: 1 Mod   PT General Charges $$ ACUTE PT VISIT: 1 Visit         Sari MATSU., PT  Office # [phone]   Erven Sari Shaker 04/26/2023, 10:47 AM

## 2023-04-27 ENCOUNTER — Other Ambulatory Visit (HOSPITAL_COMMUNITY): Payer: Self-pay

## 2023-04-27 MED ORDER — FAMOTIDINE 20 MG PO TABS
20.0000 mg | ORAL_TABLET | Freq: Every day | ORAL | 1 refills | Status: DC
  Filled 2023-04-27: qty 30, 30d supply, fill #0

## 2023-04-27 MED ORDER — ALBUTEROL SULFATE HFA 108 (90 BASE) MCG/ACT IN AERS
1.0000 | INHALATION_SPRAY | RESPIRATORY_TRACT | 1 refills | Status: DC | PRN
  Filled 2023-04-27: qty 6.7, 30d supply, fill #0

## 2023-04-27 MED ORDER — PROMETHAZINE-DM 6.25-15 MG/5ML PO SYRP
5.0000 mL | ORAL_SOLUTION | ORAL | 0 refills | Status: DC | PRN
  Filled 2023-04-27: qty 150, 5d supply, fill #0

## 2023-04-27 MED ORDER — ASPIRIN 81 MG PO CHEW
81.0000 mg | CHEWABLE_TABLET | Freq: Every day | ORAL | 1 refills | Status: DC
  Filled 2023-04-27: qty 30, 30d supply, fill #0

## 2023-04-27 MED ORDER — CYCLOBENZAPRINE HCL 10 MG PO TABS
10.0000 mg | ORAL_TABLET | Freq: Three times a day (TID) | ORAL | 0 refills | Status: DC | PRN
  Filled 2023-04-27: qty 12, 4d supply, fill #0

## 2023-04-27 MED ORDER — SERTRALINE HCL 50 MG PO TABS
50.0000 mg | ORAL_TABLET | Freq: Every day | ORAL | 1 refills | Status: AC
  Filled 2023-04-27: qty 30, 30d supply, fill #0

## 2023-04-27 MED ORDER — AMOXICILLIN-POT CLAVULANATE 875-125 MG PO TABS
1.0000 | ORAL_TABLET | Freq: Two times a day (BID) | ORAL | 0 refills | Status: DC
  Filled 2023-04-27: qty 14, 7d supply, fill #0

## 2023-04-27 MED ORDER — ATORVASTATIN CALCIUM 80 MG PO TABS
80.0000 mg | ORAL_TABLET | Freq: Every day | ORAL | 1 refills | Status: AC
  Filled 2023-04-27: qty 30, 30d supply, fill #0

## 2023-04-27 MED ORDER — PRAZOSIN HCL 2 MG PO CAPS
2.0000 mg | ORAL_CAPSULE | Freq: Every day | ORAL | 1 refills | Status: DC
  Filled 2023-04-27: qty 30, 30d supply, fill #0

## 2023-04-27 MED ORDER — PREDNISONE 20 MG PO TABS
ORAL_TABLET | ORAL | 0 refills | Status: AC
  Filled 2023-04-27: qty 10, 7d supply, fill #0

## 2023-04-27 MED ORDER — GABAPENTIN 300 MG PO CAPS
300.0000 mg | ORAL_CAPSULE | Freq: Three times a day (TID) | ORAL | 1 refills | Status: DC
  Filled 2023-04-27: qty 90, 30d supply, fill #0

## 2023-04-27 MED ORDER — BENZONATATE 200 MG PO CAPS
200.0000 mg | ORAL_CAPSULE | Freq: Three times a day (TID) | ORAL | 0 refills | Status: DC | PRN
Start: 2023-03-29 — End: 2023-06-13
  Filled 2023-04-27: qty 30, 10d supply, fill #0

## 2023-04-27 MED ORDER — BENZONATATE 200 MG PO CAPS: 200.0000 mg | ORAL_CAPSULE | Freq: Three times a day (TID) | ORAL | 0 refills | Status: DC | PRN

## 2023-04-28 ENCOUNTER — Other Ambulatory Visit (HOSPITAL_COMMUNITY): Payer: Self-pay

## 2023-04-28 MED ORDER — METHOCARBAMOL 500 MG PO TABS: 500.0000 mg | ORAL_TABLET | Freq: Four times a day (QID) | ORAL | 0 refills | Status: DC | PRN

## 2023-05-02 ENCOUNTER — Emergency Department
Admission: EM | Admit: 2023-05-02 | Discharge: 2023-05-02 | Disposition: A | Discharge status: Home / Self Care | Payer: 59 | Attending: Emergency Medicine | Admitting: Emergency Medicine

## 2023-05-02 ENCOUNTER — Emergency Department: Payer: 59

## 2023-05-02 ENCOUNTER — Other Ambulatory Visit: Payer: Self-pay

## 2023-05-02 ENCOUNTER — Other Ambulatory Visit (HOSPITAL_COMMUNITY): Payer: Self-pay

## 2023-05-02 DIAGNOSIS — R531 Weakness: Secondary | ICD-10-CM | POA: Diagnosis not present

## 2023-05-02 DIAGNOSIS — R079 Chest pain, unspecified: Secondary | ICD-10-CM | POA: Diagnosis present

## 2023-05-02 HISTORY — DX: Conversion disorder with mixed symptom presentation: F44.7

## 2023-05-02 LAB — BASIC METABOLIC PANEL
Anion gap: 11 (ref 5–15)
BUN: 7 mg/dL (ref 6–20)
CO2: 22 mmol/L (ref 22–32)
Calcium: 9.6 mg/dL (ref 8.9–10.3)
Chloride: 108 mmol/L (ref 98–111)
Creatinine, Ser: 0.78 mg/dL (ref 0.61–1.24)
GFR, Estimated: >60 mL/min (ref >60)
Glucose, Bld: 121 mg/dL — ABNORMAL HIGH (ref 70–99)
Potassium: 3.8 mmol/L (ref 3.5–5.1)
Sodium: 141 mmol/L (ref 135–145)

## 2023-05-02 LAB — CBC
HCT: 44.5 % (ref 39.0–52.0)
Hemoglobin: 15.3 g/dL (ref 13.0–17.0)
MCH: 31 pg (ref 26.0–34.0)
MCHC: 34.4 g/dL (ref 30.0–36.0)
MCV: 90.1 fL (ref 80.0–100.0)
Platelets: 284 10*3/uL (ref 150–400)
RBC: 4.94 MIL/uL (ref 4.22–5.81)
RDW: 13 % (ref 11.5–15.5)
WBC: 12 10*3/uL — ABNORMAL HIGH (ref 4.0–10.5)
nRBC: 0 % (ref 0.0–0.2)

## 2023-05-02 LAB — TROPONIN I (HIGH SENSITIVITY): Troponin I (High Sensitivity): 4 ng/L (ref <18)

## 2023-05-02 MED ORDER — FAMOTIDINE 20 MG PO TABS
20.0000 mg | ORAL_TABLET | Freq: Every day | ORAL | 1 refills | Status: DC
  Filled 2023-05-02: qty 30, 30d supply, fill #0

## 2023-05-02 MED ORDER — ALUM & MAG HYDROXIDE-SIMETH 200-200-20 MG/5ML PO SUSP
30.0000 mL | Freq: Once | ORAL | Status: AC
  Administered 2023-05-02: 30 mL via ORAL
  Filled 2023-05-02: qty 30

## 2023-05-02 MED ORDER — LIDOCAINE VISCOUS HCL 2 % MT SOLN
15.0000 mL | Freq: Once | OROMUCOSAL | Status: AC
  Administered 2023-05-02: 15 mL via ORAL
  Filled 2023-05-02: qty 15

## 2023-05-02 NOTE — ED Triage Notes (Signed)
Pt to ED for mid chest pain since last night States recently hospitalized for stroke like symptoms, discharged 6 days ago Counseled to quit smoking

## 2023-05-02 NOTE — ED Notes (Signed)
 Patient declined discharge vital signs. Patient's ride is here.

## 2023-05-02 NOTE — ED Provider Notes (Signed)
[facility] Provider Note    Event Date/Time   First MD Initiated Contact with Patient 05/02/23 1548     (approximate)   History   Chest Pain   HPI  Matthew Armstrong. is a 43 y.o. male  who presents to the emergency department today because of concern for chest pain. Located in his center chest, described as pressure like. Started earlier today. He was discharged from the hospital roughly 1 week ago after an admission for left sided weakness, per chart review thought to be functional, imaging studies were negative. He says that he typically does get this chest pain prior to his weakness episodes.        Physical Exam   Triage Vital Signs: ED Triage Vitals  Encounter Vitals Group     BP 05/02/23 1416 124/85     Systolic BP Percentile --      Diastolic BP Percentile --      Pulse Rate 05/02/23 1416 95     Resp 05/02/23 1416 20     Temp 05/02/23 1416 98 F (36.7 C)     Temp Source 05/02/23 1416 Oral     SpO2 05/02/23 1416 97 %     Weight 05/02/23 1412 183 lb (83 kg)     Height 05/02/23 1412 5\' 10"  (1.778 m)     Head Circumference --      Peak Flow --      Pain Score 05/02/23 1412 8     Pain Loc --      Pain Education --      Exclude from Growth Chart --     Most recent vital signs: Vitals:   05/02/23 1416  BP: 124/85  Pulse: 95  Resp: 20  Temp: 98 F (36.7 C)  SpO2: 97%   General: Awake, alert, oriented. CV:  Good peripheral perfusion. Regular rate and rhythm. Resp:  Normal effort. Lungs clear. Abd:  No distention.    ED Results / Procedures / Treatments   Labs (all labs ordered are listed, but only abnormal results are displayed) Labs Reviewed  BASIC METABOLIC PANEL - Abnormal; Notable for the following components:      Result Value   Glucose, Bld 121 (*)    All other components within normal limits  CBC - Abnormal; Notable for the following components:   WBC 12.0 (*)    All other components within normal limits   TROPONIN I (HIGH SENSITIVITY)  TROPONIN I (HIGH SENSITIVITY)     EKG  I, Phineas Semen, attending physician, personally viewed and interpreted this EKG  EKG Time: 1414 Rate: 87 Rhythm: normal sinus rhythm Axis: normal Intervals: qtc 421 QRS: Narrow, q waves v1, III ST changes: no st elevation Impression: abnormal ekg   RADIOLOGY I independently interpreted and visualized the CXR. My interpretation: No pneumonia. Radiology interpretation:  IMPRESSION:  No acute cardiopulmonary abnormality.     PROCEDURES:  Critical Care performed: No    MEDICATIONS ORDERED IN ED: Medications - No data to display   IMPRESSION / MDM / ASSESSMENT AND PLAN / ED COURSE  I reviewed the triage vital signs and the nursing notes.                              Differential diagnosis includes, but is not limited to, pneumonia, PE, ACS, GERD  Patient's presentation is most consistent with acute presentation with potential threat to life or  bodily function.  Patient presents to the emergency department today because of concern for chest pain. EKG, CXR and troponin without concerning abnormalities. Similar to patient's previous chest pain episodes. At this time given reassuring work up I have very low suspicion for ACS. Will plan on treating for acid reflux.    FINAL CLINICAL IMPRESSION(S) / ED DIAGNOSES   Final diagnoses:  Nonspecific chest pain     Note:  This document was prepared using Dragon voice recognition software and may include unintentional dictation errors.    Phineas Semen, MD 05/02/23 [phone]

## 2023-06-13 ENCOUNTER — Encounter: Payer: Self-pay | Admitting: Intensive Care

## 2023-06-13 ENCOUNTER — Emergency Department
Admission: EM | Admit: 2023-06-13 | Discharge: 2023-06-13 | Disposition: A | Discharge status: Home / Self Care | Attending: Student in an Organized Health Care Education/Training Program | Admitting: Student in an Organized Health Care Education/Training Program

## 2023-06-13 ENCOUNTER — Other Ambulatory Visit: Payer: Self-pay

## 2023-06-13 ENCOUNTER — Emergency Department

## 2023-06-13 DIAGNOSIS — J111 Influenza due to unidentified influenza virus with other respiratory manifestations: Secondary | ICD-10-CM | POA: Insufficient documentation

## 2023-06-13 DIAGNOSIS — R112 Nausea with vomiting, unspecified: Secondary | ICD-10-CM | POA: Diagnosis present

## 2023-06-13 HISTORY — DX: Neuromuscular dysfunction of bladder, unspecified: N31.9

## 2023-06-13 LAB — CBC WITH DIFFERENTIAL/PLATELET
Abs Immature Granulocytes: 0.02 10*3/uL (ref 0.00–0.07)
Basophils Absolute: 0 10*3/uL (ref 0.0–0.1)
Basophils Relative: 0 %
Eosinophils Absolute: 0 10*3/uL (ref 0.0–0.5)
Eosinophils Relative: 0 %
HCT: 42.2 % (ref 39.0–52.0)
Hemoglobin: 14.5 g/dL (ref 13.0–17.0)
Immature Granulocytes: 0 %
Lymphocytes Relative: 14 %
Lymphs Abs: 1.8 10*3/uL (ref 0.7–4.0)
MCH: 30.7 pg (ref 26.0–34.0)
MCHC: 34.4 g/dL (ref 30.0–36.0)
MCV: 89.2 fL (ref 80.0–100.0)
Monocytes Absolute: 0.6 10*3/uL (ref 0.1–1.0)
Monocytes Relative: 5 %
Neutro Abs: 10.1 10*3/uL — ABNORMAL HIGH (ref 1.7–7.7)
Neutrophils Relative %: 81 %
Platelets: 210 10*3/uL (ref 150–400)
RBC: 4.73 MIL/uL (ref 4.22–5.81)
RDW: 12.9 % (ref 11.5–15.5)
WBC: 12.6 10*3/uL — ABNORMAL HIGH (ref 4.0–10.5)
nRBC: 0 % (ref 0.0–0.2)

## 2023-06-13 LAB — BASIC METABOLIC PANEL WITH GFR
Anion gap: 9 (ref 5–15)
BUN: 13 mg/dL (ref 6–20)
CO2: 27 mmol/L (ref 22–32)
Calcium: 9 mg/dL (ref 8.9–10.3)
Chloride: 96 mmol/L — ABNORMAL LOW (ref 98–111)
Creatinine, Ser: 1.22 mg/dL (ref 0.61–1.24)
GFR, Estimated: >60 mL/min (ref >60)
Glucose, Bld: 107 mg/dL — ABNORMAL HIGH (ref 70–99)
Potassium: 4.8 mmol/L (ref 3.5–5.1)
Sodium: 132 mmol/L — ABNORMAL LOW (ref 135–145)

## 2023-06-13 MED ORDER — ONDANSETRON 4 MG PO TBDP: 4.0000 mg | ORAL_TABLET | Freq: Three times a day (TID) | ORAL | 0 refills | Status: DC | PRN

## 2023-06-13 MED ORDER — ONDANSETRON 4 MG PO TBDP
4.0000 mg | ORAL_TABLET | Freq: Once | ORAL | Status: AC
  Administered 2023-06-13: 4 mg via ORAL
  Filled 2023-06-13: qty 1

## 2023-06-13 NOTE — Discharge Instructions (Addendum)
 Follow-up with your primary care provider problems or [facility] if any continued problems.  A prescription for Zofran as needed for nausea and vomiting was sent to the pharmacy for you to begin taking.  Also for the next 12 hours clear liquids such as ginger ale, Sprite, 7-Up, Jell-O, popsicles until your nausea is under better control.  Then gradually increase your diet to bananas, applesauce, rice and toast.  May take Tylenol as needed for body aches or fever.

## 2023-06-13 NOTE — ED Provider Notes (Signed)
 [facility] Provider Note    Event Date/Time   First MD Initiated Contact with Patient 06/13/23 [phone]     (approximate)   History   No chief complaint on file.   HPI  Matthew Armstrong. is a 43 y.o. male   presents to the ED with complaint of not feeling well after being diagnosed with the flu 2 days ago at urgent care.  Patient reports he has had nausea and vomiting and unable to keep anything down.  Patient has a history of CVA, major depressive disorder, atrial fibs, neurogenic bladder and being a smoker.      Physical Exam   Triage Vital Signs: ED Triage Vitals  Encounter Vitals Group     BP 06/13/23 0859 116/86     Systolic BP Percentile --      Diastolic BP Percentile --      Pulse Rate 06/13/23 0859 77     Resp 06/13/23 0859 16     Temp 06/13/23 0859 98.1 F (36.7 C)     Temp Source 06/13/23 0859 Oral     SpO2 06/13/23 0859 95 %     Weight 06/13/23 0900 175 lb (79.4 kg)     Height 06/13/23 0900 5\' 10"  (1.778 m)     Head Circumference --      Peak Flow --      Pain Score 06/13/23 0900 8     Pain Loc --      Pain Education --      Exclude from Growth Chart --     Most recent vital signs: Vitals:   06/13/23 0859  BP: 116/86  Pulse: 77  Resp: 16  Temp: 98.1 F (36.7 C)  SpO2: 95%     General: Awake, no distress.  Able to talk in complete sentences without any noticeable shortness of breath. CV:  Good peripheral perfusion.  Heart regular rate rhythm. Resp:  Normal effort.  Lungs are clear bilaterally. Abd:  No distention.  Other:     ED Results / Procedures / Treatments   Labs (all labs ordered are listed, but only abnormal results are displayed) Labs Reviewed  CBC WITH DIFFERENTIAL/PLATELET - Abnormal; Notable for the following components:      Result Value   WBC 12.6 (*)    Neutro Abs 10.1 (*)    All other components within normal limits  BASIC METABOLIC PANEL WITH GFR - Abnormal; Notable for the following  components:   Sodium 132 (*)    Chloride 96 (*)    Glucose, Bld 107 (*)    All other components within normal limits      RADIOLOGY Chest x-ray images reviewed and interpreted by myself independent of the radiologist and was negative for acute cardiopulmonary changes.    PROCEDURES:  Critical Care performed:   Procedures   MEDICATIONS ORDERED IN ED: Medications  ondansetron (ZOFRAN-ODT) disintegrating tablet 4 mg (4 mg Oral Given 06/13/23 0943)     IMPRESSION / MDM / ASSESSMENT AND PLAN / ED COURSE  I reviewed the triage vital signs and the nursing notes.   Differential diagnosis includes, but is not limited to, influenza, pneumonia, hypokalemia, hyponatremia, anemia.  43 year old male presents to the ED after being diagnosed with influenza 2 days ago.  Patient was given Zofran while in the ED and had no continued vomiting and was able to tolerate fluids without any difficulty.  Lab work was reviewed and encouraging.  Patient was made aware that  his chest x-ray was negative for pneumonia.  He is to continue with increased fluids and a prescription for Zofran was sent to his pharmacy to take as needed.  He is to follow-up with his PCP or urgent care if any continued problems or return to the emergency department if any severe worsening of his symptoms.      Patient's presentation is most consistent with acute complicated illness / injury requiring diagnostic workup.  FINAL CLINICAL IMPRESSION(S) / ED DIAGNOSES   Final diagnoses:  Influenza     Rx / DC Orders   ED Discharge Orders          Ordered    ondansetron (ZOFRAN-ODT) 4 MG disintegrating tablet  Every 8 hours PRN        06/13/23 1051             Note:  This document was prepared using Dragon voice recognition software and may include unintentional dictation errors.   Tommi Rumps, PA-C 06/13/23 1438    Willy Eddy, MD 06/13/23 1500

## 2023-06-13 NOTE — ED Triage Notes (Signed)
 Tested positive for flu at [facility] X2 days per patient. Reports he is not feeling any better. C/o emesis, cough, congestion, and body aches  On cell phone and ambulating in triage room with NAD noted.

## 2023-06-15 ENCOUNTER — Other Ambulatory Visit: Payer: Self-pay

## 2023-06-15 ENCOUNTER — Emergency Department
Admission: EM | Admit: 2023-06-15 | Discharge: 2023-06-15 | Disposition: A | Discharge status: Home / Self Care | Attending: Emergency Medicine | Admitting: Emergency Medicine

## 2023-06-15 ENCOUNTER — Emergency Department

## 2023-06-15 DIAGNOSIS — J209 Acute bronchitis, unspecified: Secondary | ICD-10-CM | POA: Insufficient documentation

## 2023-06-15 DIAGNOSIS — Z8673 Personal history of transient ischemic attack (TIA), and cerebral infarction without residual deficits: Secondary | ICD-10-CM | POA: Diagnosis not present

## 2023-06-15 DIAGNOSIS — R059 Cough, unspecified: Secondary | ICD-10-CM | POA: Diagnosis present

## 2023-06-15 MED ORDER — PREDNISONE 10 MG PO TABS: ORAL_TABLET | ORAL | 0 refills | Status: DC

## 2023-06-15 MED ORDER — HYDROCODONE BIT-HOMATROP MBR 5-1.5 MG/5ML PO SOLN
5.0000 mL | Freq: Four times a day (QID) | ORAL | 0 refills | Status: DC | PRN
Start: 2023-06-15 — End: 2023-08-21

## 2023-06-15 MED ORDER — HYDROCOD POLI-CHLORPHE POLI ER 10-8 MG/5ML PO SUER
5.0000 mL | Freq: Once | ORAL | Status: AC
  Administered 2023-06-15: 5 mL via ORAL
  Filled 2023-06-15: qty 5

## 2023-06-15 MED ORDER — AMOXICILLIN 500 MG PO CAPS: 500.0000 mg | ORAL_CAPSULE | Freq: Three times a day (TID) | ORAL | 0 refills | Status: DC

## 2023-06-15 MED ORDER — IPRATROPIUM-ALBUTEROL 0.5-2.5 (3) MG/3ML IN SOLN
3.0000 mL | Freq: Once | RESPIRATORY_TRACT | Status: AC
Start: 2023-06-15 — End: 2023-06-15
  Administered 2023-06-15: 3 mL via RESPIRATORY_TRACT
  Filled 2023-06-15: qty 3

## 2023-06-15 NOTE — ED Triage Notes (Signed)
 Pt comes with flu symptoms. Pt was dx last week with flu. Pt states dec intake and cough that won't stop. Pt states it  hurts to take in breath.

## 2023-06-15 NOTE — Discharge Instructions (Addendum)
Take the prescription meds as directed.  °

## 2023-06-15 NOTE — ED Provider Triage Note (Signed)
 Emergency Medicine Provider Triage Evaluation Note  Matthew Armstrong. , a 43 y.o. male  was evaluated in triage.  Pt complains of positive flu. Patient has had fevers, and SOB, feels like he can't take a deep breath. Diagnosed with the flu last week.  Review of Systems  Positive: Cough, sob Negative:   Physical Exam  BP 132/78   Pulse 100   Temp 98.9 F (37.2 C)   Resp 18   SpO2 99%  Gen:   Awake, no distress   Resp:  Normal effort  MSK:   Moves extremities without difficulty  Other:    Medical Decision Making  Medically screening exam initiated at 6:08 PM.  Appropriate orders placed.  Matthew Armstrong. was informed that the remainder of the evaluation will be completed by another provider, this initial triage assessment does not replace that evaluation, and the importance of remaining in the ED until their evaluation is complete.     Cameron Ali, PA-C 06/15/23 1809

## 2023-06-15 NOTE — ED Provider Notes (Signed)
 [facility] Provider Note    Event Date/Time   First MD Initiated Contact with Patient 06/15/23 [phone]     (approximate)   History   flu+   HPI  Matthew Armstrong. is a 43 y.o. male with history of CVA, A-fib presents emergency department with worsening cough and congestion.  Wheezing.  Right ear pain.  No known fever.  No vomiting or diarrhea.  Patient was diagnosed with influenza last week      Physical Exam   Triage Vital Signs: ED Triage Vitals  Encounter Vitals Group     BP 06/15/23 1807 132/78     Systolic BP Percentile --      Diastolic BP Percentile --      Pulse Rate 06/15/23 1807 100     Resp 06/15/23 1807 18     Temp 06/15/23 1807 98.9 F (37.2 C)     Temp src --      SpO2 06/15/23 1807 99 %     Weight --      Height --      Head Circumference --      Peak Flow --      Pain Score 06/15/23 1806 3     Pain Loc --      Pain Education --      Exclude from Growth Chart --     Most recent vital signs: Vitals:   06/15/23 1807  BP: 132/78  Pulse: 100  Resp: 18  Temp: 98.9 F (37.2 C)  SpO2: 99%     General: Awake, no distress.   CV:  Good peripheral perfusion. regular rate and  rhythm Resp:  Normal effort. Lungs with wheezing in all lung fields Abd:  No distention.   Other:     ED Results / Procedures / Treatments   Labs (all labs ordered are listed, but only abnormal results are displayed) Labs Reviewed - No data to display   EKG     RADIOLOGY Chest x-ray    PROCEDURES:   Procedures Chief Complaint  Patient presents with   flu+      MEDICATIONS ORDERED IN ED: Medications  ipratropium-albuterol (DUONEB) 0.5-2.5 (3) MG/3ML nebulizer solution 3 mL (has no administration in time range)  chlorpheniramine-HYDROcodone (TUSSIONEX) 10-8 MG/5ML suspension 5 mL (has no administration in time range)     IMPRESSION / MDM / ASSESSMENT AND PLAN / ED COURSE  I reviewed the triage vital signs and the nursing  notes.                              Differential diagnosis includes, but is not limited to, influenza, CAP, acute bronchitis, chronic cough  Patient's presentation is most consistent with acute illness / injury with system symptoms.   Chest x-ray independently reviewed interpreted by me as being negative for any acute abnormality due to the wheezing and cough that is harsh and hacking we will go ahead and give the patient a DuoNeb and Tussionex here in the ED.   Due to the patient's right ear looking infected along with the cough, we will go ahead and treat him with amoxicillin, prednisone 6-day Dosepak and Hycodan cough syrup.  Told him to be aware of sedation with Hycodan cough syrup.  However due to the strong cough that he has a feel he needs it at this time.  He is in agreement treatment plan.  Will be discharged after  DuoNeb.   FINAL CLINICAL IMPRESSION(S) / ED DIAGNOSES   Final diagnoses:  Acute bronchitis, unspecified organism     Rx / DC Orders   ED Discharge Orders          Ordered    amoxicillin (AMOXIL) 500 MG capsule  3 times daily        06/15/23 1910    predniSONE (DELTASONE) 10 MG tablet        06/15/23 1910    HYDROcodone bit-homatropine (HYCODAN) 5-1.5 MG/5ML syrup  Every 6 hours PRN        06/15/23 1910             Note:  This document was prepared using Dragon voice recognition software and may include unintentional dictation errors.    Faythe Ghee, PA-C 06/15/23 1912    Merwyn Katos, MD 06/15/23 [phone]

## 2023-08-04 ENCOUNTER — Telehealth: Payer: Self-pay

## 2023-08-04 NOTE — Patient Outreach (Signed)
 Second telephone outreach attempt to obtain mRS. No answer. Left message for returned call.  [facility]  Population Health Care Management Assistant  Direct Dial: [phone]  Fax: [phone] Website: [email or web address]

## 2023-08-05 ENCOUNTER — Telehealth: Payer: Self-pay

## 2023-08-05 NOTE — Patient Outreach (Signed)
 3 outreach attempts were completed to obtain mRs. mRs could not be obtained because patient never returned my calls. mRs=7    Vanice Sarah [facility] Health Care Management Assistant  Direct Dial: [phone]  Fax: [phone] Website: [email or web address]

## 2023-08-21 ENCOUNTER — Other Ambulatory Visit: Payer: Self-pay

## 2023-08-21 ENCOUNTER — Encounter (HOSPITAL_COMMUNITY): Payer: Self-pay | Admitting: Internal Medicine

## 2023-08-21 ENCOUNTER — Emergency Department (HOSPITAL_COMMUNITY)

## 2023-08-21 ENCOUNTER — Observation Stay (HOSPITAL_COMMUNITY)

## 2023-08-21 ENCOUNTER — Observation Stay (HOSPITAL_COMMUNITY): Admission: EM | Admit: 2023-08-21 | Discharge: 2023-08-25 | Attending: Internal Medicine | Admitting: Internal Medicine

## 2023-08-21 DIAGNOSIS — I4891 Unspecified atrial fibrillation: Secondary | ICD-10-CM | POA: Diagnosis not present

## 2023-08-21 DIAGNOSIS — Z79899 Other long term (current) drug therapy: Secondary | ICD-10-CM | POA: Insufficient documentation

## 2023-08-21 DIAGNOSIS — R531 Weakness: Principal | ICD-10-CM

## 2023-08-21 DIAGNOSIS — E785 Hyperlipidemia, unspecified: Secondary | ICD-10-CM | POA: Insufficient documentation

## 2023-08-21 DIAGNOSIS — I10 Essential (primary) hypertension: Secondary | ICD-10-CM | POA: Diagnosis not present

## 2023-08-21 DIAGNOSIS — F447 Conversion disorder with mixed symptom presentation: Secondary | ICD-10-CM | POA: Insufficient documentation

## 2023-08-21 DIAGNOSIS — R471 Dysarthria and anarthria: Secondary | ICD-10-CM | POA: Diagnosis not present

## 2023-08-21 DIAGNOSIS — Z7982 Long term (current) use of aspirin: Secondary | ICD-10-CM | POA: Insufficient documentation

## 2023-08-21 DIAGNOSIS — R079 Chest pain, unspecified: Secondary | ICD-10-CM

## 2023-08-21 DIAGNOSIS — F17210 Nicotine dependence, cigarettes, uncomplicated: Secondary | ICD-10-CM | POA: Insufficient documentation

## 2023-08-21 DIAGNOSIS — R001 Bradycardia, unspecified: Secondary | ICD-10-CM | POA: Diagnosis not present

## 2023-08-21 DIAGNOSIS — I480 Paroxysmal atrial fibrillation: Secondary | ICD-10-CM | POA: Insufficient documentation

## 2023-08-21 DIAGNOSIS — R2990 Unspecified symptoms and signs involving the nervous system: Principal | ICD-10-CM | POA: Insufficient documentation

## 2023-08-21 LAB — I-STAT CHEM 8, ED
BUN: 15 mg/dL (ref 6–20)
Calcium, Ion: 1.01 mmol/L — ABNORMAL LOW (ref 1.15–1.40)
Chloride: 102 mmol/L (ref 98–111)
Creatinine, Ser: 0.9 mg/dL (ref 0.61–1.24)
Glucose, Bld: 192 mg/dL — ABNORMAL HIGH (ref 70–99)
HCT: 39 % (ref 39.0–52.0)
Hemoglobin: 13.3 g/dL (ref 13.0–17.0)
Potassium: 5.8 mmol/L — ABNORMAL HIGH (ref 3.5–5.1)
Sodium: 135 mmol/L (ref 135–145)
TCO2: 24 mmol/L (ref 22–32)

## 2023-08-21 LAB — COMPREHENSIVE METABOLIC PANEL WITH GFR
ALT: 15 U/L (ref 0–44)
AST: 25 U/L (ref 15–41)
Albumin: 3.5 g/dL (ref 3.5–5.0)
Alkaline Phosphatase: 52 U/L (ref 38–126)
Anion gap: 8 (ref 5–15)
BUN: 12 mg/dL (ref 6–20)
CO2: 24 mmol/L (ref 22–32)
Calcium: 8.7 mg/dL — ABNORMAL LOW (ref 8.9–10.3)
Chloride: 103 mmol/L (ref 98–111)
Creatinine, Ser: 0.81 mg/dL (ref 0.61–1.24)
GFR, Estimated: >60 mL/min (ref >60)
Glucose, Bld: 169 mg/dL — ABNORMAL HIGH (ref 70–99)
Potassium: 4.4 mmol/L (ref 3.5–5.1)
Sodium: 135 mmol/L (ref 135–145)
Total Bilirubin: 0.9 mg/dL (ref 0.0–1.2)
Total Protein: 5.4 g/dL — ABNORMAL LOW (ref 6.5–8.1)

## 2023-08-21 LAB — CBC
HCT: 40.4 % (ref 39.0–52.0)
Hemoglobin: 13.6 g/dL (ref 13.0–17.0)
MCH: 31.6 pg (ref 26.0–34.0)
MCHC: 33.7 g/dL (ref 30.0–36.0)
MCV: 93.7 fL (ref 80.0–100.0)
Platelets: 244 10*3/uL (ref 150–400)
RBC: 4.31 MIL/uL (ref 4.22–5.81)
RDW: 13.9 % (ref 11.5–15.5)
WBC: 7.8 10*3/uL (ref 4.0–10.5)
nRBC: 0 % (ref 0.0–0.2)

## 2023-08-21 LAB — TROPONIN I (HIGH SENSITIVITY): Troponin I (High Sensitivity): 3 ng/L (ref <18)

## 2023-08-21 LAB — CBG MONITORING, ED: Glucose-Capillary: 208 mg/dL — ABNORMAL HIGH (ref 70–99)

## 2023-08-21 LAB — DIFFERENTIAL
Abs Immature Granulocytes: 0.01 10*3/uL (ref 0.00–0.07)
Basophils Absolute: 0 10*3/uL (ref 0.0–0.1)
Basophils Relative: 0 %
Eosinophils Absolute: 0.1 10*3/uL (ref 0.0–0.5)
Eosinophils Relative: 1 %
Immature Granulocytes: 0 %
Lymphocytes Relative: 30 %
Lymphs Abs: 2.4 10*3/uL (ref 0.7–4.0)
Monocytes Absolute: 0.3 10*3/uL (ref 0.1–1.0)
Monocytes Relative: 4 %
Neutro Abs: 5.1 10*3/uL (ref 1.7–7.7)
Neutrophils Relative %: 65 %

## 2023-08-21 LAB — RAPID URINE DRUG SCREEN, HOSP PERFORMED
Amphetamines: NOT DETECTED
Barbiturates: NOT DETECTED
Benzodiazepines: NOT DETECTED
Cocaine: NOT DETECTED
Opiates: NOT DETECTED
Tetrahydrocannabinol: POSITIVE — AB

## 2023-08-21 LAB — PROTIME-INR
INR: 1 (ref 0.8–1.2)
Prothrombin Time: 13.2 s (ref 11.4–15.2)

## 2023-08-21 LAB — APTT: aPTT: 28 s (ref 24–36)

## 2023-08-21 LAB — ETHANOL: Alcohol, Ethyl (B): <15 mg/dL (ref <15)

## 2023-08-21 MED ORDER — LORAZEPAM 2 MG/ML IJ SOLN
0.5000 mg | Freq: Once | INTRAMUSCULAR | Status: AC | PRN
  Administered 2023-08-21: 0.5 mg via INTRAVENOUS
  Filled 2023-08-21: qty 1

## 2023-08-21 MED ORDER — IOHEXOL 350 MG/ML SOLN
100.0000 mL | Freq: Once | INTRAVENOUS | Status: AC | PRN
  Administered 2023-08-21: 100 mL via INTRAVENOUS

## 2023-08-21 MED ORDER — PNEUMOCOCCAL 20-VAL CONJ VACC 0.5 ML IM SUSY
0.5000 mL | PREFILLED_SYRINGE | INTRAMUSCULAR | Status: AC
  Administered 2023-08-22: 0.5 mL via INTRAMUSCULAR
  Filled 2023-08-21: qty 0.5

## 2023-08-21 MED ORDER — STROKE: EARLY STAGES OF RECOVERY BOOK
Freq: Once | Status: AC
  Filled 2023-08-21: qty 1

## 2023-08-21 MED ORDER — ATORVASTATIN CALCIUM 80 MG PO TABS
80.0000 mg | ORAL_TABLET | Freq: Every day | ORAL | Status: DC
  Administered 2023-08-21 – 2023-08-25 (×5): 80 mg via ORAL
  Filled 2023-08-21 (×5): qty 1

## 2023-08-21 MED ORDER — MORPHINE SULFATE (PF) 2 MG/ML IV SOLN
2.0000 mg | INTRAVENOUS | Status: DC | PRN
  Administered 2023-08-21 – 2023-08-22 (×2): 2 mg via INTRAVENOUS
  Filled 2023-08-21 (×2): qty 1

## 2023-08-21 NOTE — ED Notes (Signed)
 Pt taken to MRI

## 2023-08-21 NOTE — ED Triage Notes (Signed)
 BIB [facility] from [facility] for possible code stroke, Patient LWK 1100. Patient started feeling bad and weaker than normal starting around this time. Staff noted slurred speech and facial droop around 1656. Patient has a history of stroke with left sided deficits. Last eliquis  on Sunday.   Aaron Aas

## 2023-08-21 NOTE — ED Notes (Signed)
 Resent light green top

## 2023-08-21 NOTE — H&P (Signed)
 History and Physical    Matthew Armstrong. CBJ:[id number] DOB: [birth year] DOA: 08/21/2023  PCP: Pcp, No  Patient coming from: [place]  Chief Complaint: Left-sided weakness  HPI: Matthew Armstrong. is a 43 y.o. male with medical history significant of A-fib with history of Eliquis  noncompliance, spinal cord injury with neurogenic bladder, hypertension, hyperlipidemia, tobacco and marijuana abuse, anxiety, PTSD, chronic pain syndrome, reported previous strokes, functional neurologic symptoms disorder presents to the ED via EMS as code stroke from [facility] for evaluation of left-sided weakness/numbness, left facial droop, and dysarthria.  Last known well at 11 AM and out of window for IV thrombolytic at the time of ED arrival.  CT head showing no acute intracranial abnormality.  CTA head and neck negative for LVO.  CT perfusion study negative.  Vital signs stable.  UDS positive for THC.  Neurology recommended brain MRI and stroke workup.  TRH called to admit.  Patient states he was not feeling well today.  This morning around 10 or 11 AM he started having a headache and then had weakness/numbness of the entire left side of his body including face, arm, and leg.  Also had difficulty with his speech.  He reports history of previous stroke 3 years ago with residual left-sided weakness.  Patient states he is supposed to be on Eliquis  and aspirin  but has not been able to take his home medications since Sunday as he went to jail.  He is also reporting cough for the past few days.  Reporting shortness of breath and constant nonexertional left-sided chest pain since this morning.  Denies nausea, vomiting, abdominal pain, or diarrhea.  Review of Systems:  Review of Systems  All other systems reviewed and are negative.   Past Medical History:  Diagnosis Date   Atrial fibrillation [facility])    Bladder disorder    Functional neurological symptom disorder with mixed symptoms    Hypokalemia 01/04/2023    Neurogenic bladder    Stroke [facility]) 2009    Past Surgical History:  Procedure Laterality Date   BACK SURGERY       reports that he has been smoking cigarettes. He started smoking about 10 years ago. He has a 5.2 pack-year smoking history. He has never used smokeless tobacco. He reports that he does not currently use alcohol. He reports that he does not use drugs.  Allergies  Allergen Reactions   Acetaminophen  Nausea Only   Ciprofloxacin Hives   Clindamycin/Lincomycin Other (See Comments)    Unknown reaction   Ibuprofen Nausea And Vomiting    Abdominal pain, difficulty breathing   Naproxen Other (See Comments)    Not listed on MAR   Nsaids     Pt tolerates ASA   Tetracyclines & Related Diarrhea and Nausea And Vomiting    Abdominal pain    No family history on file.  Prior to Admission medications   Medication Sig Start Date End Date Taking? Authorizing Provider  aspirin  EC 81 MG tablet Take 81 mg by mouth every morning. Swallow whole.   Yes [provider]  atorvastatin  (LIPITOR ) 80 MG tablet Take 1 tablet (80 mg total) by mouth daily. 04/26/23  Yes Shafer, Sim Dryer, NP  prazosin  (MINIPRESS ) 2 MG capsule Take 1 capsule (2 mg total) by mouth at bedtime. 04/26/23  Yes Imogene Mana, NP  albuterol  (VENTOLIN  HFA) 108 (90 Base) MCG/ACT inhaler Inhale 1-2 puffs into the lungs as needed for wheezing or shortness of breath. Patient not taking: Reported on 08/21/2023  04/26/23   Imogene Mana, NP  aspirin  81 MG chewable tablet Chew 1 tablet (81 mg total) by mouth daily. Patient not taking: Reported on 08/21/2023 04/26/23   Imogene Mana, NP  famotidine  (PEPCID ) 20 MG tablet Take 1 tablet (20 mg total) by mouth daily. Patient not taking: Reported on 08/21/2023 04/26/23   Imogene Mana, NP  famotidine  (PEPCID ) 20 MG tablet Take 1 tablet (20 mg total) by mouth daily. Patient not taking: Reported on 08/21/2023 05/02/23 05/01/24  Marylynn Soho, MD  gabapentin  (NEURONTIN ) 300 MG capsule Take 1 capsule  (300 mg total) by mouth 3 (three) times daily. Patient not taking: Reported on 08/21/2023 04/26/23   Imogene Mana, NP  sertraline  (ZOLOFT ) 50 MG tablet Take 1 tablet (50 mg total) by mouth daily. Patient not taking: Reported on 08/21/2023 04/26/23   Imogene Mana, NP    Physical Exam: Vitals:   08/21/23 1815 08/21/23 1858  BP: 124/65   Pulse: 69   Resp: 20   Temp: 98.7 F (37.1 C)   TempSrc: Oral   SpO2: 100%   Weight:  79.4 kg  Height:  5\' 10"  (1.778 m)    Physical Exam Vitals reviewed.  Constitutional:      General: He is not in acute distress. HENT:     Head: Normocephalic and atraumatic.  Eyes:     Extraocular Movements: Extraocular movements intact.  Cardiovascular:     Rate and Rhythm: Normal rate and regular rhythm.     Pulses: Normal pulses.  Pulmonary:     Effort: Pulmonary effort is normal. No respiratory distress.     Breath sounds: Normal breath sounds. No wheezing, rhonchi or rales.  Abdominal:     General: Bowel sounds are normal. There is no distension.     Palpations: Abdomen is soft.     Tenderness: There is no abdominal tenderness. There is no guarding.  Musculoskeletal:     Cervical back: Normal range of motion.     Right lower leg: No edema.     Left lower leg: No edema.  Skin:    General: Skin is warm and dry.  Neurological:     Mental Status: He is alert and oriented to person, place, and time.     Comments: Left facial droop Left upper and lower extremity weakness Diminished sensation to light touch on the left side of the face, left upper and lower extremity     Labs on Admission: I have personally reviewed following labs and imaging studies  CBC: Recent Labs  Lab 08/21/23 1814 08/21/23 1826  WBC  --  7.8  NEUTROABS  --  5.1  HGB 13.3 13.6  HCT 39.0 40.4  MCV  --  93.7  PLT  --  244   Basic Metabolic Panel: Recent Labs  Lab 08/21/23 1814 08/21/23 1903  NA 135 135  K 5.8* 4.4  CL 102 103  CO2  --  24  GLUCOSE 192* 169*  BUN  15 12  CREATININE 0.90 0.81  CALCIUM   --  8.7*   GFR: Estimated Creatinine Clearance: 125.2 mL/min (by C-G formula based on SCr of 0.81 mg/dL). Liver Function Tests: Recent Labs  Lab 08/21/23 1903  AST 25  ALT 15  ALKPHOS 52  BILITOT 0.9  PROT 5.4*  ALBUMIN 3.5   No results for input(s): "LIPASE", "AMYLASE" in the last 168 hours. No results for input(s): "AMMONIA" in the last 168 hours. Coagulation Profile: Recent Labs  Lab 08/21/23 1826  INR 1.0  Cardiac Enzymes: No results for input(s): "CKTOTAL", "CKMB", "CKMBINDEX", "TROPONINI" in the last 168 hours. BNP (last 3 results) No results for input(s): "PROBNP" in the last 8760 hours. HbA1C: No results for input(s): "HGBA1C" in the last 72 hours. CBG: Recent Labs  Lab 08/21/23 1807  GLUCAP 208*   Lipid Profile: No results for input(s): "CHOL", "HDL", "LDLCALC", "TRIG", "CHOLHDL", "LDLDIRECT" in the last 72 hours. Thyroid  Function Tests: No results for input(s): "TSH", "T4TOTAL", "FREET4", "T3FREE", "THYROIDAB" in the last 72 hours. Anemia Panel: No results for input(s): "VITAMINB12", "FOLATE", "FERRITIN", "TIBC", "IRON", "RETICCTPCT" in the last 72 hours. Urine analysis:    Component Value Date/Time   COLORURINE YELLOW 01/28/2023 1750   APPEARANCEUR CLEAR 01/28/2023 1750   LABSPEC 1.033 (H) 01/28/2023 1750   PHURINE 6.0 01/28/2023 1750   GLUCOSEU NEGATIVE 01/28/2023 1750   HGBUR NEGATIVE 01/28/2023 1750   BILIRUBINUR NEGATIVE 01/28/2023 1750   KETONESUR NEGATIVE 01/28/2023 1750   PROTEINUR NEGATIVE 01/28/2023 1750   NITRITE NEGATIVE 01/28/2023 1750   LEUKOCYTESUR NEGATIVE 01/28/2023 1750    Radiological Exams on Admission: CT ANGIO HEAD NECK W WO CM W PERF (CODE STROKE) Result Date: 08/21/2023 CLINICAL DATA:  Neuro deficit, concern for stroke. EXAM: CT ANGIOGRAPHY HEAD AND NECK CT PERFUSION BRAIN TECHNIQUE: Multidetector CT imaging of the head and neck was performed using the standard protocol during bolus  administration of intravenous contrast. Multiplanar CT image reconstructions and MIPs were obtained to evaluate the vascular anatomy. Carotid stenosis measurements (when applicable) are obtained utilizing NASCET criteria, using the distal internal carotid diameter as the denominator. Multiphase CT imaging of the brain was performed following IV bolus contrast injection. Subsequent parametric perfusion maps were calculated using RAPID software. RADIATION DOSE REDUCTION: This exam was performed according to the departmental dose-optimization program which includes automated exposure control, adjustment of the mA and/or kV according to patient size and/or use of iterative reconstruction technique. CONTRAST:  OMNIPAQUE  IOHEXOL  350 MG/ML SOLN COMPARISON:  Earlier same day head CT. CTA head and neck 04/24/2023. FINDINGS: CTA NECK FINDINGS Aortic arch: Standard configuration of the aortic arch. Imaged portion shows no evidence of aneurysm or dissection. No significant stenosis of the major arch vessel origins. Pulmonary arteries: As permitted by contrast timing, there are no filling defects in the visualized pulmonary arteries. Subclavian arteries: The subclavian arteries are patent bilaterally. Right carotid system: No evidence of dissection, stenosis (50% or greater), or occlusion. Left carotid system: No evidence of dissection, stenosis (50% or greater), or occlusion. Vertebral arteries: Codominant. No evidence of dissection, stenosis (50% or greater), or occlusion. Skeleton: No acute or aggressive finding noted. Other neck: The visualized airway is patent. No cervical lymphadenopathy. Upper chest: Visualized lung apices are clear. Review of the MIP images confirms the above findings CTA HEAD FINDINGS ANTERIOR CIRCULATION: The intracranial ICAs are patent bilaterally. No significant stenosis, proximal occlusion, aneurysm, or vascular malformation. MCAs: The middle cerebral arteries are patent bilaterally. ACAs:  The anterior cerebral arteries are patent bilaterally. POSTERIOR CIRCULATION: No significant stenosis, proximal occlusion, aneurysm, or vascular malformation. PCAs: Patent bilaterally.  Fetal origin of the right PCA. Pcomm: The posterior communicating arteries are visualized bilaterally. SCAs: The superior cerebellar arteries are patent bilaterally. Basilar artery: Patent AICAs: Small caliber AICA visualized on the right. PICAs: Visualized on the left. Vertebral arteries: The intracranial vertebral arteries are patent. Venous sinuses: As permitted by contrast timing, patent. Anatomic variants: None Review of the MIP images confirms the above findings CT Brain Perfusion Findings: ASPECTS: 10 CBF (<30%) Volume:  0mL Perfusion (Tmax>6.0s) volume: 0mL Mismatch Volume: 0mL Infarction Location:None identified IMPRESSION: No large vessel occlusion. No core infarct identified on CT perfusion. No high-grade stenosis, aneurysm, or dissection of the arteries in the head and neck. Electronically Signed   By: Denny Flack M.D.   On: 08/21/2023 18:57   CT HEAD CODE STROKE WO CONTRAST Result Date: 08/21/2023 CLINICAL DATA:  Code stroke.  Neuro deficit, concern for stroke. EXAM: CT HEAD WITHOUT CONTRAST TECHNIQUE: Contiguous axial images were obtained from the base of the skull through the vertex without intravenous contrast. RADIATION DOSE REDUCTION: This exam was performed according to the departmental dose-optimization program which includes automated exposure control, adjustment of the mA and/or kV according to patient size and/or use of iterative reconstruction technique. COMPARISON:  MRI head 04/25/2023. FINDINGS: Brain: No acute intracranial hemorrhage. No CT evidence of acute infarct. No edema, mass effect, or midline shift. The basilar cisterns are patent. Ventricles: The ventricles are normal. Vascular: No hyperdense vessel or unexpected calcification. Skull: No acute or aggressive finding. Orbits: Orbits are symmetric.  Sinuses: Mucosal thickening and possible mucous retention cysts in the left maxillary sinus. Other: Mastoid air cells are clear. ASPECTS [facility] Stroke Program Early CT Score) - Ganglionic level infarction (caudate, lentiform nuclei, internal capsule, insula, M1-M3 cortex): 7 - Supraganglionic infarction (M4-M6 cortex): 3 Total score (0-10 with 10 being normal): 10 IMPRESSION: 1. No CT evidence of acute intracranial abnormality. 2. ASPECTS is 10 These results were communicated to Dr. Doretta Gant At 6:24 pm on 08/21/2023 by text page via the [facility] messaging system. Electronically Signed   By: Denny Flack M.D.   On: 08/21/2023 18:24    EKG: Independently reviewed.  Sinus rhythm, no acute changes.  Assessment and Plan  Acute onset left-sided weakness/numbness, left facial droop, dysarthria Out of window for IV thrombolytic at the time of arrival. CT head showing no acute intracranial abnormality.  CTA head and neck negative for LVO.  CT perfusion study negative.  Neurology recommended brain MRI and stroke workup. -Management per neurology recommendations -Telemetry monitoring -Brain MRI pending -Echocardiogram -Hemoglobin A1c, fasting lipid panel -Frequent neurochecks -PT, OT, speech therapy. -N.p.o. until cleared by bedside swallow evaluation or formal speech evaluation  Chest pain and shortness of breath Lungs clear on exam.  EKG without acute ischemic changes.  Troponin x 2 and chest x-ray ordered.  PE less likely given no tachycardia, tachypnea, hypoxia, or clinical signs of DVT on exam.  ?Paroxysmal A-fib Currently in sinus rhythm.  Patient is currently in jail and has not taken Eliquis  for several days.   Per review of previous note from neurology from 04/26/2023: "Eliquis  initiated between August and September of 2024 without documentation for review from prescribing provider on [facility]; no documented afib in [facility] or [place] chart review. We will NOT resume eliquis  at discharge" -Neurology  recommendation from today pending  Hypertension Allow permissive hypertension at this time.  Hyperlipidemia Continue Lipitor .  Tobacco and marijuana abuse Continue to provide counseling.  Anxiety/claustrophobia Patient is requesting medication prior to MRI.  IV Ativan  0.5 mg x 1 ordered.  Code Status: Full Code (discussed with the patient) Consults called: Neurology Level of care: Telemetry bed Admission status: It is my clinical opinion that referral for OBSERVATION is reasonable and necessary in this patient based on the above information provided. The aforementioned taken together are felt to place the patient at high risk for further clinical deterioration. However, it is anticipated that the patient may be medically stable for discharge from  the hospital within 24 to 48 hours.  Juliette Oh MD [facility]  If 7PM-7AM, please contact night-coverage [email or web address]  08/21/2023, 8:04 PM

## 2023-08-21 NOTE — Code Documentation (Signed)
 Stroke Response Nurse Documentation Code Documentation  Oriel Rumbold. is a 43 y.o. male arriving to [place]  via [facility] on 08/21/2023 with past medical hx of functional neurological disorder, and CVA with left sided weakness per pt. On Eliquis  (apixaban ) daily. Code stroke was activated by EMS. Per Pt he takes Eliquis , but hasn't had it since he has been incarcerated on Sunday.  Patient from jail where he was LKW at 1100 and now complaining of increased left sided weakness, sensory loss, and dysarthria .   Stroke team at the bedside on patient arrival. Labs drawn and patient cleared for CT by EDP .Patient to CT with team. NIHSS 15, see documentation for details and code stroke times. Patient with left facial droop, left arm weakness, left leg weakness, left decreased sensation, and dysarthria  on exam. The following imaging was completed:  CT Head, CTA, and CTP. Patient is not a candidate for IV Thrombolytic due to OOW. Patient is not a candidate for IR due to LVO negative.   Care Plan: Q 2 NIHSS and VS x 12, then q 4.     Bedside handoff with ED RN Judie Noun.    Raynetta Osterloh Livengood  Stroke Response RN

## 2023-08-21 NOTE — Consult Note (Signed)
 NEUROLOGY CONSULT NOTE   Date of service: August 21, 2023 Patient Name: Matthew Armstrong. MRN:  [id number] DOB:  [birth year] Chief Complaint: "CODE STROKE" Requesting Provider: Mordecai Applebaum, MD  History of Present Illness  Dontrez Pettis. is a 43 y.o. male with hx of a fib on eliquis , self-reported stroke with residual L-sided weakness (prior presentation for acute L-sided weakness 04/25/23; received TNK at that time; patient reports residual L-sided weakness since then despite normal MRI brain after TNK that admission) who [facility]IB [facility] from jail for acute onset worsening L sided weakness and severe dysarthria. He is prescribed eliquis  but has not had it since Friday per EMS bc he has not received in since being incarcerated (despite administration of his 3 other prescription medications at jail since then). Jail reported to EMS that LKW was 5pm however patient told me he had worsening L sided weakness since 1100 therefore was outside the window for TNK.  LKW: 1100 Modified rankin score: 1-No significant post stroke disability and can perform usual duties with stroke symptoms IV Thrombolysis: No, outside of window EVT: No, no LVO  NIHSS components Score: Comment  1a Level of Conscious 0[x]  1[]  2[]  3[]      1b LOC Questions 0[x]  1[]  2[]       1c LOC Commands 0[x]  1[]  2[]       2 Best Gaze 0[x]  1[]  2[]       3 Visual 0[x]  1[]  2[]  3[]      4 Facial Palsy 0[]  1[]  2[x]  3[]      5a Motor Arm - left 0[]  1[]  2[]  3[]  4[x]  UN[]    5b Motor Arm - Right 0[x]  1[]  2[]  3[]  4[]  UN[]    6a Motor Leg - Left 0[]  1[]  2[]  3[]  4[x]  UN[]    6b Motor Leg - Right 0[x]  1[]  2[]  3[]  4[]  UN[]    7 Limb Ataxia 0[x]  1[]  2[]  UN[]      8 Sensory 0[]  1[x]  2[]  UN[]      9 Best Language 0[x]  1[]  2[]  3[]      10 Dysarthria 0[]  1[]  2[x]  UN[]      11 Extinct. and Inattention 0[x]  1[]  2[]       TOTAL: 13      ROS  Comprehensive ROS performed and pertinent positives documented in HPI   Past History   Past Medical History:   Diagnosis Date   Atrial fibrillation [facility])    Bladder disorder    Functional neurological symptom disorder with mixed symptoms    Hypokalemia 01/04/2023   Neurogenic bladder    Stroke [facility]) 2009    Past Surgical History:  Procedure Laterality Date   BACK SURGERY      Family History: No family history on file.  Social History  reports that he has been smoking cigarettes. He started smoking about 10 years ago. He has a 5.2 pack-year smoking history. He has never used smokeless tobacco. He reports that he does not currently use alcohol. He reports that he does not use drugs.  Allergies  Allergen Reactions   Acetaminophen  Nausea Only   Ciprofloxacin    Clindamycin/Lincomycin    Ibuprofen    Naproxen    Nsaids     Pt tolerates ASA   Tetracyclines & Related     Medications  No current facility-administered medications for this encounter.  Current Outpatient Medications:    acetaminophen  (TYLENOL ) 325 MG tablet, Take 650 mg by mouth once as needed for moderate pain (pain score 4-6)., Disp: , Rfl:    albuterol  (VENTOLIN   HFA) 108 (90 Base) MCG/ACT inhaler, Inhale 1-2 puffs into the lungs as needed for wheezing or shortness of breath., Disp: 18 g, Rfl: 1   albuterol  (VENTOLIN  HFA) 108 (90 Base) MCG/ACT inhaler, Inhale 1-2 puffs into the lungs as needed for wheezing or shortness of breath., Disp: 6.7 g, Rfl: 1   amoxicillin  (AMOXIL ) 500 MG capsule, Take 1 capsule (500 mg total) by mouth 3 (three) times daily., Disp: 30 capsule, Rfl: 0   aspirin  81 MG chewable tablet, Chew 1 tablet (81 mg total) by mouth daily., Disp: 30 tablet, Rfl: 1   aspirin  81 MG chewable tablet, Chew 1 tablet (81 mg total) by mouth daily., Disp: 30 tablet, Rfl: 1   atorvastatin  (LIPITOR ) 80 MG tablet, Take 1 tablet (80 mg total) by mouth daily., Disp: 30 tablet, Rfl: 1   atorvastatin  (LIPITOR ) 80 MG tablet, Take 1 tablet (80 mg total) by mouth daily., Disp: 30 tablet, Rfl: 1   cyclobenzaprine  (FLEXERIL ) 10 MG  tablet, Take 1 tablet (10 mg total) by mouth 3 (three) times daily as needed for muscle spasm., Disp: 12 tablet, Rfl: 0   famotidine  (PEPCID ) 20 MG tablet, Take 1 tablet (20 mg total) by mouth daily., Disp: 30 tablet, Rfl: 1   famotidine  (PEPCID ) 20 MG tablet, Take 1 tablet (20 mg total) by mouth daily., Disp: 30 tablet, Rfl: 1   famotidine  (PEPCID ) 20 MG tablet, Take 1 tablet (20 mg total) by mouth daily., Disp: 30 tablet, Rfl: 1   gabapentin  (NEURONTIN ) 300 MG capsule, Take 1 capsule (300 mg total) by mouth 3 (three) times daily., Disp: 90 capsule, Rfl: 1   gabapentin  (NEURONTIN ) 300 MG capsule, Take 1 capsule (300 mg total) by mouth 3 (three) times daily., Disp: 90 capsule, Rfl: 1   HYDROcodone  bit-homatropine (HYCODAN) 5-1.5 MG/5ML syrup, Take 5 mLs by mouth every 6 (six) hours as needed for cough., Disp: 120 mL, Rfl: 0   ondansetron  (ZOFRAN -ODT) 4 MG disintegrating tablet, Take 1 tablet (4 mg total) by mouth every 8 (eight) hours as needed for nausea or vomiting., Disp: 12 tablet, Rfl: 0   prazosin  (MINIPRESS ) 2 MG capsule, Take 1 capsule (2 mg total) by mouth at bedtime., Disp: 30 capsule, Rfl: 1   prazosin  (MINIPRESS ) 2 MG capsule, Take 1 capsule (2 mg total) by mouth at bedtime., Disp: 30 capsule, Rfl: 1   predniSONE  (DELTASONE ) 10 MG tablet, Take 6 pills on day 1, 5 pills on day 2, 4 pills on day 3, 3 pills on day 4, 2 pills on day 5, then the next day finish the medication, Disp: 30 tablet, Rfl: 0   promethazine -dextromethorphan (PROMETHAZINE -DM) 6.25-15 MG/5ML syrup, Take 5 mLs by mouth every 4-6 (four to six) hours as needed. Not to exceed 30 mls in 24 hours., Disp: 150 mL, Rfl: 0   sertraline  (ZOLOFT ) 50 MG tablet, Take 1 tablet (50 mg total) by mouth daily., Disp: 30 tablet, Rfl: 1   sertraline  (ZOLOFT ) 50 MG tablet, Take 1 tablet (50 mg total) by mouth daily., Disp: 30 tablet, Rfl: 1  Vitals   Vitals:   08/21/23 1815 08/21/23 1858  BP: 124/65   Pulse: 69   Resp: 20   Temp: 98.7  F (37.1 C)   TempSrc: Oral   SpO2: 100%   Weight:  79.4 kg  Height:  5\' 10"  (1.778 m)    Body mass index is 25.12 kg/m.   Physical Exam   Constitutional: Appears well-developed and well-nourished.  Cardiovascular: Normal rate and  regular rhythm.  Respiratory: Effort normal, non-labored breathing.   Neurologic Examination   Gen: patient lying in bed, NAD CV: extremities appear well-perfused Resp: normal WOB  Neurologic exam MS: alert, oriented x4, follows commands Speech: severe dysarthria no aphasia CN: PERRL, VFF except L upper quadrant diplopia, EOMI, L face numb, severe lower L facial droop, hearing intact to voice Motor: LUE and LLE no movement, RUE and RLE no drift Sensory: subjective severe sensory deficit L side throughout, no neglect Reflexes: 2+ symm with toes down bilat Coordination: FNF intact on R Gait: deferred   Labs/Imaging/Neurodiagnostic studies   CBC:  Recent Labs  Lab August 31, 2023 1814 08/31/2023 1826  WBC  --  7.8  NEUTROABS  --  5.1  HGB 13.3 13.6  HCT 39.0 40.4  MCV  --  93.7  PLT  --  244   Basic Metabolic Panel:  Lab Results  Component Value Date   NA 135 08/31/23   K 5.8 (H) 08/31/23   CO2 27 06/13/2023   GLUCOSE 192 (H) 08/31/23   BUN 15 2023/08/31   CREATININE 0.90 2023/08/31   CALCIUM  9.0 06/13/2023   GFRNONAA >60 06/13/2023   GFRAA >60 10/14/2019   Lipid Panel:  Lab Results  Component Value Date   LDLCALC 116 (H) 04/25/2023   HgbA1c:  Lab Results  Component Value Date   HGBA1C 5.2 04/24/2023   Urine Drug Screen:     Component Value Date/Time   LABOPIA NONE DETECTED 04/24/2023 2054   COCAINSCRNUR NONE DETECTED 04/24/2023 2054   LABBENZ NONE DETECTED 04/24/2023 2054   AMPHETMU NONE DETECTED 04/24/2023 2054   THCU POSITIVE (A) 04/24/2023 2054   LABBARB NONE DETECTED 04/24/2023 2054    Alcohol Level     Component Value Date/Time   ETH <15 31-Aug-2023 1826   INR  Lab Results  Component Value Date   INR  1.0 08/31/23   APTT  Lab Results  Component Value Date   APTT 28 08-31-2023   AED levels: No results found for: "PHENYTOIN", "ZONISAMIDE", "LAMOTRIGINE", "LEVETIRACETA"  CT Head without contrast(Personally reviewed): No CT evidence of acute intracranial abnormality Aspects 10  CT angio Head and Neck with perfusion (Personally reviewed): No large vessel occlusion No core infarct identified on CT perfusion No high-grade stenosis/aneurysm/dissection of arteries in head neck  MRI Brain(Personally reviewed): pending   ASSESSMENT   Arhaan Chesnut. is a 43 y.o. male with hx of a fib on eliquis , self-reported stroke with residual L-sided weakness despite normal MRI during stroke workup during Feb 2025 admission who presented with L hemiplegia (much more severe than self-reported baseline) and severe dysarthria. He is prescribed eliquis  but had missed the past 7 days of doses 2/2 incarceration. He was outside the window for TNK and not eligible for intervention 2/2 no LVO on CTA.   RECOMMENDATIONS   - Admit for stroke workup - Permissive HTN x48 hrs from sx onset or until stroke ruled out by MRI goal BP <220/110. PRN labetalol or hydralazine if BP above these parameters. Avoid oral antihypertensives. - MRI brain wo contrast - TTE - Check A1c and LDL + add statin per guidelines - Hold eliquis  pending MRI brain. Aspirin  not administered in ED 2/2 hx allergy "difficulty breathing" - q4 hr neuro checks - STAT head CT for any change in neuro exam - Tele - PT/OT/SLP - Stroke education - Amb referral to neurology upon discharge   Stroke team will follow in consult starting tomorrow 6/7  Greg Leaks, MD  [facility] [phone]  If 7pm- 7am, please page neurology on call as listed in AMION.

## 2023-08-21 NOTE — ED Provider Notes (Addendum)
 [place] EMERGENCY DEPARTMENT AT [facility] Provider Note  CSN: [id number] Arrival date & time: 08/21/23 1805  Chief Complaint(s) Code Stroke  HPI Matthew Armstrong. is a 43 y.o. male history of atrial fibrillation, prior stroke, on Eliquis  presenting to the emergency department with weakness.  Patient reports weakness starting around 11 AM today, on the left side.  He is currently an inmate and apparently had not received his Eliquis  since last Friday.  Denies any chest pain, abdominal pain, headaches, nausea, vomiting, but new symptoms.  Was activated as a code stroke prehospital.   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 Authorizing Provider  aspirin  EC 81 MG tablet Take 81 mg by mouth every morning. Swallow whole.   Yes [provider]  atorvastatin  (LIPITOR ) 80 MG tablet Take 1 tablet (80 mg total) by mouth daily. 04/26/23  Yes Shafer, Sim Dryer, NP  prazosin  (MINIPRESS ) 2 MG capsule Take 1 capsule (2 mg total) by mouth at bedtime. 04/26/23  Yes Imogene Mana, NP  albuterol  (VENTOLIN  HFA) 108 (90 Base) MCG/ACT inhaler Inhale 1-2 puffs into the lungs as needed for wheezing or shortness of breath. Patient not taking: Reported on 08/21/2023 04/26/23   Imogene Mana, NP  aspirin  81 MG chewable tablet Chew 1 tablet (81 mg total) by mouth daily. Patient not taking: Reported on 08/21/2023 04/26/23   Imogene Mana, NP  famotidine  (PEPCID ) 20 MG tablet Take 1 tablet (20 mg total) by mouth daily. Patient not taking: Reported on 08/21/2023 04/26/23   Imogene Mana, NP  famotidine  (PEPCID ) 20 MG tablet Take 1 tablet (20 mg total) by mouth daily. Patient not taking: Reported on 08/21/2023 05/02/23 05/01/24  Marylynn Soho, MD  gabapentin  (NEURONTIN ) 300 MG capsule Take 1 capsule (300 mg total) by mouth 3 (three) times daily. Patient not taking: Reported on 08/21/2023 04/26/23   Imogene Mana, NP  sertraline  (ZOLOFT ) 50 MG tablet Take 1 tablet (50 mg total) by mouth daily. Patient not taking: Reported on 08/21/2023 04/26/23   Imogene Mana, NP  Past Surgical History Past Surgical History:  Procedure Laterality Date   BACK SURGERY     Family History No family history on file.  Social History Social History   Tobacco Use   Smoking status: Every Day    Current packs/day: 0.50    Average packs/day: 0.5 packs/day for 10.3 years (5.2 ttl pk-yrs)    Types: Cigarettes    Start date: 05/01/2013   Smokeless tobacco: Never  Vaping Use   Vaping status: Every Day  Substance Use Topics   Alcohol use: Not Currently    Comment: occ   Drug use: No   Allergies Acetaminophen , Ciprofloxacin, Clindamycin/lincomycin, Ibuprofen, Naproxen, Nsaids, and Tetracyclines & related  Review of Systems Review of Systems  All other systems reviewed and are negative.   Physical Exam Vital Signs  I have reviewed the triage vital signs BP 124/65 (BP Location: Right Arm)   Pulse 69   Temp 98.7 F (37.1 C) (Oral)   Resp 20   Ht 5\' 10"  (1.778 m)   Wt 79.4 kg   SpO2 100%   BMI 25.12 kg/m  Physical Exam Vitals and nursing note reviewed.  Constitutional:       General: He is not in acute distress.    Appearance: Normal appearance.  HENT:     Mouth/Throat:     Mouth: Mucous membranes are moist.  Eyes:     Conjunctiva/sclera: Conjunctivae normal.  Cardiovascular:     Rate and Rhythm: Normal rate and regular rhythm.  Pulmonary:     Effort: Pulmonary effort is normal. No respiratory distress.     Breath sounds: Normal breath sounds.  Abdominal:     General: Abdomen is flat.     Palpations: Abdomen is soft.     Tenderness: There is no abdominal tenderness.  Musculoskeletal:     Right lower leg: No edema.     Left lower leg: No edema.  Skin:    General: Skin is warm and dry.     Capillary Refill: Capillary refill takes less than 2 seconds.  Neurological:     Mental Status: He is alert and oriented to person, place, and time.     Comments: Left-sided facial droop, sparing forehead.  Left arm and left leg weakness, 2-3 out of 5.  Right upper and lower extremity with 5 out of 5 strength.  Cranial nerves otherwise intact.  Psychiatric:        Mood and Affect: Mood normal.        Behavior: Behavior normal.     ED Results and Treatments Labs (all labs ordered are listed, but only abnormal results are displayed) Labs Reviewed  RAPID URINE DRUG SCREEN, HOSP PERFORMED - Abnormal; Notable for the following components:      Result Value   Tetrahydrocannabinol POSITIVE (*)    All other components within normal limits  COMPREHENSIVE METABOLIC PANEL WITH GFR - Abnormal; Notable for the following components:   Glucose, Bld 169 (*)    Calcium  8.7 (*)    Total Protein 5.4 (*)    All other components within normal limits  CBG MONITORING, ED - Abnormal; Notable for the following components:   Glucose-Capillary 208 (*)    All other components within normal limits  I-STAT CHEM 8, ED - Abnormal; Notable for the following components:   Potassium 5.8 (*)    Glucose, Bld 192 (*)    Calcium , Ion 1.01 (*)    All other components within normal limits   ETHANOL  PROTIME-INR  APTT  CBC  DIFFERENTIAL  I-STAT CHEM 8, ED                                                                                                                          Radiology CT ANGIO HEAD NECK W WO CM W PERF (CODE STROKE) Result Date: 08/21/2023 CLINICAL DATA:  Neuro deficit, concern for stroke. EXAM: CT ANGIOGRAPHY HEAD AND NECK CT PERFUSION BRAIN TECHNIQUE: Multidetector CT imaging of the head and neck was performed using the standard protocol during bolus administration of intravenous contrast. Multiplanar CT image reconstructions and MIPs were obtained to evaluate the vascular anatomy. Carotid stenosis measurements (when applicable) are obtained utilizing NASCET criteria, using the distal internal carotid diameter as the denominator. Multiphase CT imaging of the brain was performed following IV bolus contrast injection. Subsequent parametric perfusion maps were calculated using RAPID software. RADIATION DOSE REDUCTION: This exam was performed according to the departmental dose-optimization program which includes automated exposure control, adjustment of the mA and/or kV according to patient size and/or use of iterative reconstruction technique. CONTRAST:  OMNIPAQUE  IOHEXOL  350 MG/ML SOLN COMPARISON:  Earlier same day head CT. CTA head and neck 04/24/2023. FINDINGS: CTA NECK FINDINGS Aortic arch: Standard configuration of the aortic arch. Imaged portion shows no evidence of aneurysm or dissection. No significant stenosis of the major arch vessel origins. Pulmonary arteries: As permitted by contrast timing, there are no filling defects in the visualized pulmonary arteries. Subclavian arteries: The subclavian arteries are patent bilaterally. Right carotid system: No evidence of dissection, stenosis (50% or greater), or occlusion. Left carotid system: No evidence of dissection, stenosis (50% or greater), or occlusion. Vertebral arteries: Codominant. No evidence of dissection,  stenosis (50% or greater), or occlusion. Skeleton: No acute or aggressive finding noted. Other neck: The visualized airway is patent. No cervical lymphadenopathy. Upper chest: Visualized lung apices are clear. Review of the MIP images confirms the above findings CTA HEAD FINDINGS ANTERIOR CIRCULATION: The intracranial ICAs are patent bilaterally. No significant stenosis, proximal occlusion, aneurysm, or vascular malformation. MCAs: The middle cerebral arteries are patent bilaterally. ACAs: The anterior cerebral arteries are patent bilaterally. POSTERIOR CIRCULATION: No significant stenosis, proximal occlusion, aneurysm, or vascular malformation. PCAs: Patent bilaterally.  Fetal origin of the right PCA. Pcomm: The posterior communicating arteries are visualized bilaterally. SCAs: The superior cerebellar arteries are patent bilaterally. Basilar artery: Patent AICAs: Small caliber AICA visualized on the right. PICAs: Visualized on the left. Vertebral arteries: The intracranial vertebral arteries are patent. Venous sinuses: As permitted by contrast timing, patent. Anatomic variants: None Review of the MIP images confirms the above findings CT Brain Perfusion Findings: ASPECTS: 10 CBF (<30%) Volume: 0mL Perfusion (Tmax>6.0s) volume: 0mL Mismatch Volume: 0mL Infarction Location:None identified IMPRESSION: No large vessel occlusion. No core infarct identified on CT perfusion. No high-grade stenosis, aneurysm, or dissection of the arteries in the head and neck. Electronically Signed   By: Denny Flack M.D.   On: 08/21/2023 18:57   CT  HEAD CODE STROKE WO CONTRAST Result Date: 08/21/2023 CLINICAL DATA:  Code stroke.  Neuro deficit, concern for stroke. EXAM: CT HEAD WITHOUT CONTRAST TECHNIQUE: Contiguous axial images were obtained from the base of the skull through the vertex without intravenous contrast. RADIATION DOSE REDUCTION: This exam was performed according to the departmental dose-optimization program which includes  automated exposure control, adjustment of the mA and/or kV according to patient size and/or use of iterative reconstruction technique. COMPARISON:  MRI head 04/25/2023. FINDINGS: Brain: No acute intracranial hemorrhage. No CT evidence of acute infarct. No edema, mass effect, or midline shift. The basilar cisterns are patent. Ventricles: The ventricles are normal. Vascular: No hyperdense vessel or unexpected calcification. Skull: No acute or aggressive finding. Orbits: Orbits are symmetric. Sinuses: Mucosal thickening and possible mucous retention cysts in the left maxillary sinus. Other: Mastoid air cells are clear. ASPECTS [facility] Stroke Program Early CT Score) - Ganglionic level infarction (caudate, lentiform nuclei, internal capsule, insula, M1-M3 cortex): 7 - Supraganglionic infarction (M4-M6 cortex): 3 Total score (0-10 with 10 being normal): 10 IMPRESSION: 1. No CT evidence of acute intracranial abnormality. 2. ASPECTS is 10 These results were communicated to Dr. Doretta Gant At 6:24 pm on 08/21/2023 by text page via the [facility] messaging system. Electronically Signed   By: Denny Flack M.D.   On: 08/21/2023 18:24    Pertinent labs & imaging results that were available during my care of the patient were reviewed by me and considered in my medical decision making (see MDM for details).  Medications Ordered in ED Medications  iohexol  (OMNIPAQUE ) 350 MG/ML injection 100 mL (100 mLs Intravenous Contrast Given 08/21/23 1830)                                                                                                                                     Procedures .Critical Care  Performed by: Mordecai Applebaum, MD Authorized by: Mordecai Applebaum, MD   Critical care provider statement:    Critical care time (minutes):  30   Critical care was necessary to treat or prevent imminent or life-threatening deterioration of the following conditions:  CNS failure or compromise   Critical care was time spent  personally by me on the following activities:  Development of treatment plan with patient or surrogate, discussions with consultants, evaluation of patient's response to treatment, examination of patient, ordering and review of laboratory studies, ordering and review of radiographic studies, ordering and performing treatments and interventions, pulse oximetry, re-evaluation of patient's condition and review of old charts   Care discussed with: admitting provider     (including critical care time)  Medical Decision Making / ED Course   MDM:  43 year old presenting to the emergency department with left-sided weakness  Patient well-appearing, does have left-sided weakness on exam.  Differential includes stroke, recrudescence, conversion disorder, functional neurologic disorder, malingering.  Denies other symptoms to suggest alternative process such as dissection, no chest  pain.  CT head without evidence of intracranial bleeding.  Patient was seen by neurology, TNK was considered, however symptoms began at 11 AM and patient outside window.  Thrombectomy was considered however CT angiography was without evidence of acute arterial occlusion.  Will need hospitalist admission per neurology recommendation.  Clinical Course as of 08/21/23 2025  Fri Aug 21, 2023  2008 Patient signed out to Dr. Michell Ahumada for admission. [WS]    Clinical Course User Index [WS] Mordecai Applebaum, MD     Additional history obtained: -Additional history obtained from ems -External records from outside source obtained and reviewed including: Chart review including previous notes, labs, imaging, consultation notes including prior notes    Lab Tests: -I ordered, reviewed, and interpreted labs.   The pertinent results include:   Labs Reviewed  RAPID URINE DRUG SCREEN, HOSP PERFORMED - Abnormal; Notable for the following components:      Result Value   Tetrahydrocannabinol POSITIVE (*)    All other components within  normal limits  COMPREHENSIVE METABOLIC PANEL WITH GFR - Abnormal; Notable for the following components:   Glucose, Bld 169 (*)    Calcium  8.7 (*)    Total Protein 5.4 (*)    All other components within normal limits  CBG MONITORING, ED - Abnormal; Notable for the following components:   Glucose-Capillary 208 (*)    All other components within normal limits  I-STAT CHEM 8, ED - Abnormal; Notable for the following components:   Potassium 5.8 (*)    Glucose, Bld 192 (*)    Calcium , Ion 1.01 (*)    All other components within normal limits  ETHANOL  PROTIME-INR  APTT  CBC  DIFFERENTIAL  I-STAT CHEM 8, ED    Notable for +uds, mild hyperglycemia   EKG   EKG Interpretation Date/Time:  Friday August 21 2023 18:35:21 EDT Ventricular Rate:  76 PR Interval:  149 QRS Duration:  99 QT Interval:  376 QTC Calculation: 423 R Axis:   72  Text Interpretation: Sinus rhythm Probable left atrial enlargement Nonspecific T abnormalities, lateral leads Confirmed by Hiawatha Lout ([id number]) on 08/21/2023 7:55:02 PM         Imaging Studies ordered: I ordered imaging studies including CT head, CTA head neck On my interpretation imaging demonstrates no acute process I independently visualized and interpreted imaging. I agree with the radiologist interpretation   Medicines ordered and prescription drug management: Meds ordered this encounter  Medications   iohexol  (OMNIPAQUE ) 350 MG/ML injection 100 mL    -I have reviewed the patients home medicines and have made adjustments as needed   Consultations Obtained: I requested consultation with the neurologist Dr. Doretta Gant,  and discussed lab and imaging findings as well as pertinent plan - they recommend: medicine admission  Social Determinants of Health:  Diagnosis or treatment significantly limited by social determinants of health: inmate    Reevaluation: After the interventions noted above, I reevaluated the patient and found that their  symptoms have improved  Co morbidities that complicate the patient evaluation  Past Medical History:  Diagnosis Date   Atrial fibrillation [facility])    Bladder disorder    Functional neurological symptom disorder with mixed symptoms    Hypokalemia 01/04/2023   Neurogenic bladder    Stroke [facility]) 2009      Dispostion: Disposition decision including need for hospitalization was considered, and patient admitted to the hospital.    Final Clinical Impression(s) / ED Diagnoses Final diagnoses:  Left-sided weakness  This chart was dictated using voice recognition software.  Despite best efforts to proofread,  errors can occur which can change the documentation meaning.    Mordecai Applebaum, MD 08/21/23 2025    Mordecai Applebaum, MD 08/21/23 2025

## 2023-08-22 ENCOUNTER — Encounter (HOSPITAL_COMMUNITY): Payer: Self-pay | Admitting: Internal Medicine

## 2023-08-22 DIAGNOSIS — F1210 Cannabis abuse, uncomplicated: Secondary | ICD-10-CM

## 2023-08-22 DIAGNOSIS — G43109 Migraine with aura, not intractable, without status migrainosus: Secondary | ICD-10-CM | POA: Diagnosis not present

## 2023-08-22 DIAGNOSIS — F17210 Nicotine dependence, cigarettes, uncomplicated: Secondary | ICD-10-CM

## 2023-08-22 DIAGNOSIS — I10 Essential (primary) hypertension: Secondary | ICD-10-CM | POA: Diagnosis not present

## 2023-08-22 DIAGNOSIS — R29818 Other symptoms and signs involving the nervous system: Secondary | ICD-10-CM

## 2023-08-22 DIAGNOSIS — Z765 Malingerer [conscious simulation]: Secondary | ICD-10-CM

## 2023-08-22 DIAGNOSIS — E785 Hyperlipidemia, unspecified: Secondary | ICD-10-CM | POA: Diagnosis not present

## 2023-08-22 DIAGNOSIS — F447 Conversion disorder with mixed symptom presentation: Secondary | ICD-10-CM | POA: Diagnosis not present

## 2023-08-22 LAB — LIPID PANEL
Cholesterol: 171 mg/dL (ref 0–200)
HDL: 40 mg/dL — ABNORMAL LOW (ref >40)
LDL Cholesterol: 115 mg/dL — ABNORMAL HIGH (ref 0–99)
Total CHOL/HDL Ratio: 4.3 ratio
Triglycerides: 78 mg/dL (ref <150)
VLDL: 16 mg/dL (ref 0–40)

## 2023-08-22 LAB — TROPONIN I (HIGH SENSITIVITY): Troponin I (High Sensitivity): 4 ng/L (ref <18)

## 2023-08-22 LAB — MRSA NEXT GEN BY PCR, NASAL: MRSA by PCR Next Gen: DETECTED — AB

## 2023-08-22 MED ORDER — METOCLOPRAMIDE HCL 5 MG/ML IJ SOLN
10.0000 mg | Freq: Once | INTRAMUSCULAR | Status: AC
  Administered 2023-08-22: 10 mg via INTRAVENOUS
  Filled 2023-08-22: qty 2

## 2023-08-22 MED ORDER — ACETAMINOPHEN 325 MG PO TABS
650.0000 mg | ORAL_TABLET | Freq: Four times a day (QID) | ORAL | Status: DC | PRN
  Filled 2023-08-22 (×2): qty 2

## 2023-08-22 MED ORDER — ATROPINE SULFATE 0.4 MG/ML IV SOLN
0.5000 mg | Freq: Once | INTRAVENOUS | Status: AC
  Administered 2023-08-22: 0.5 mg via INTRAVENOUS
  Filled 2023-08-22: qty 1.25

## 2023-08-22 MED ORDER — MUPIROCIN 2 % EX OINT
1.0000 | TOPICAL_OINTMENT | Freq: Two times a day (BID) | CUTANEOUS | Status: DC
  Administered 2023-08-22 – 2023-08-25 (×5): 1 via NASAL
  Filled 2023-08-22: qty 22

## 2023-08-22 MED ORDER — CHLORHEXIDINE GLUCONATE CLOTH 2 % EX PADS
6.0000 | MEDICATED_PAD | Freq: Every day | CUTANEOUS | Status: DC
  Administered 2023-08-22 – 2023-08-23 (×2): 6 via TOPICAL

## 2023-08-22 MED ORDER — EZETIMIBE 10 MG PO TABS
10.0000 mg | ORAL_TABLET | Freq: Every day | ORAL | Status: DC
  Administered 2023-08-22 – 2023-08-25 (×4): 10 mg via ORAL
  Filled 2023-08-22 (×4): qty 1

## 2023-08-22 MED ORDER — ENSURE PLUS HIGH PROTEIN PO LIQD
237.0000 mL | Freq: Two times a day (BID) | ORAL | Status: DC
  Administered 2023-08-22 – 2023-08-25 (×7): 237 mL via ORAL

## 2023-08-22 MED ORDER — DIPHENHYDRAMINE HCL 50 MG/ML IJ SOLN
12.5000 mg | Freq: Once | INTRAMUSCULAR | Status: AC
  Administered 2023-08-22: 12.5 mg via INTRAVENOUS
  Filled 2023-08-22: qty 1

## 2023-08-22 MED ORDER — ASPIRIN 81 MG PO TBEC
81.0000 mg | DELAYED_RELEASE_TABLET | Freq: Every day | ORAL | Status: DC
  Administered 2023-08-22 – 2023-08-25 (×4): 81 mg via ORAL
  Filled 2023-08-22 (×4): qty 1

## 2023-08-22 NOTE — Assessment & Plan Note (Signed)
-   No further complaints of chest pain when seen this morning - EKG negative for signs of ischemia -Troponin trend negative x 2

## 2023-08-22 NOTE — Assessment & Plan Note (Signed)
-   Normotensive without any medication at this time

## 2023-08-22 NOTE — Assessment & Plan Note (Addendum)
-   of note, please see 04/26/23 neurology note as fully summarizes multiple episodes of similar as well as prior workups done and multiple institutions  - main complaints/symptoms include: LUE and LLE weakness/paralysis, left sided paresthesias, left facial droop, and dysarthria - Patient has had extensive prior workups for strokes and again on admission; MRI brain again negative for CVA; still no documented stroke from prior imaging studies -He has been taken off of Eliquis  (there has been no confirmed afib in his workups in the past) in February and recommended to continue only on aspirin  (confirmed [place] [facility] has been giving asa with no issues and tolerating well) -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 and having just been recently incarcerated on 08/17/2023 and now presenting back to the hospital with neurodeficits consistent with prior hospitalizations at other institutions -Other considered differentials would also include conversion disorder vs complicated migraine -Appreciate neurology evaluations as well as PT, OT, speech -His dysarthria and left facial droop almost fully resolved when he is distracted and carrying on full conversation; Hoover sign also positive on exam with hip testing -Unfortunately, still not able to adequately ambulate with PT/OT; for now, we will continue hospitalization until able to speak with jail liaison on Monday to help assist with having him transferred to potentially [facility] for ongoing rehab; prolonging hospitalization will only encourage ongoing behavior (however volitional it is), so goal is for discharging back to jail as soon as possible

## 2023-08-22 NOTE — Progress Notes (Signed)
 Progress Note    Matthew Armstrong.   NFA:[id number]  DOB: [birth year]  DOA: 08/21/2023     0 PCP: Pcp, No  Initial CC: left side weakness   Hospital Course: 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 He has been hospitalized within multiple health systems including [facility], [facility], [facility], [place].   There is a very detailed neurology note from 04/26/2023 that should be reviewed for any future hospitalizations to aid with understanding patient's extensive previous workups.  For this hospitalization, patient presented with left-sided weakness/numbness along with "left facial droop", and "dysarthria".  He was admitted for stroke workup. Although he reports not being given Eliquis  in jail, he was discontinued from Eliquis  after 04/26/2023 neurology evaluation.  There were no obvious active prescriptions for Eliquis  noted on his med rec on admission.  He had only been incarcerated at the [facility] since 08/17/2023.  Although patient reports prior history of stroke, no imaging workup has shown evidence of stroke thus far.  He again underwent MRI brain on admission which was again negative for acute abnormalities nor any evidence of CVA. CT angio head/neck also negative for LVO nor any high-grade stenosis in the head or neck. CT head also unremarkable. Lab workup was also rather unremarkable.  Despite negative workup, upon evaluation with physical and occupational Therapy, he exhibited dense left-sided deficits. His physical exam was unable to be replicated consistently raising further concern for malingering and functional neurologic symptoms.   Hoover sign was positive on exam as well. With ongoing conversation, his left facial droop and dysarthria almost fully resolved especially with distraction.  Due to this, he  is not felt to be safe for discharging without further resources of some sort of rehab given the ongoing left-sided weakness.  He was recommended for equipment such as rolling walker, bedside commode, wheelchair.  Case will be discussed with jail for further disposition planning.  Interval History:  Sitting in recliner when seen this morning.  He had just finished a conversation upon myself walking again and he immediately became more dysarthric with exaggerated left facial droop.  With ongoing conversation and distraction his dysarthria and facial droop improved. He persisted to have severe left-sided weakness during testing however Hoover test positive during examination.  He also asked about going to rehab if his symptoms "did not improve".  Assessment and Plan: * Functional neurological symptom disorder with mixed symptoms - of note, please see 04/26/23 neurology note as fully summarizes multiple episodes of similar as well as prior workups done and multiple institutions  - main complaints/symptoms include: LUE and LLE weakness/paralysis, left sided paresthesias, left facial droop, and dysarthria - Patient has had extensive prior workups for strokes and again on admission; MRI brain again negative for CVA; still no documented stroke from prior imaging studies -He has been taken off of Eliquis  (there has been no confirmed afib in his workups in the past) in February and recommended to continue only on aspirin  (confirmed [place] [facility] has been giving asa with no issues and tolerating well) -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 and having just been recently incarcerated on 08/17/2023 and now presenting back to the hospital with neurodeficits consistent with prior hospitalizations at other institutions -Other considered differentials would also include conversion disorder vs complicated migraine -Appreciate neurology evaluations as  well  as PT, OT, speech -His dysarthria and left facial droop almost fully resolved when he is distracted and carrying on full conversation; Hoover sign also positive on exam with hip testing -Unfortunately, still not able to adequately ambulate with PT/OT; for now, we will continue hospitalization until able to speak with jail liaison on Monday to help assist with having him transferred to potentially [facility] for ongoing rehab; prolonging hospitalization will only encourage ongoing behavior (however volitional it is), so goal is for discharging back to jail as soon as possible  HLD (hyperlipidemia) - Continue Lipitor  - LDL 115  HTN (hypertension) - Normotensive without any medication at this time  Chest pain-resolved as of 08/22/2023 - No further complaints of chest pain when seen this morning - EKG negative for signs of ischemia -Troponin trend negative x 2   Old records reviewed in assessment of this patient  Antimicrobials:   DVT prophylaxis:    Code Status:   Code Status: Full Code  Mobility Assessment (Last 72 Hours)     Mobility Assessment     Row Name 08/22/23 1354 08/22/23 1050 08/22/23 1002 08/21/23 2246     Does patient have an order for bedrest or is patient medically unstable -- -- No - Continue assessment No - Continue assessment    What is the highest level of mobility based on the progressive mobility assessment? Level 3 (Stands with assist) - Balance while standing  and cannot march in place Level 3 (Stands with assist) - Balance while standing  and cannot march in place Level 3 (Stands with assist) - Balance while standing  and cannot march in place Level 3 (Stands with assist) - Balance while standing  and cannot march in place    Is the above level different from baseline mobility prior to current illness? -- -- -- Yes - Recommend PT order             Barriers to discharge: awaiting jail liaison on Monday to discuss tx to [facility]  Disposition Plan:  TBD Status  is: Inpt  Objective: Blood pressure 109/74, pulse (!) 54, temperature 98.1 F (36.7 C), temperature source Oral, resp. rate 20, height 5\' 10"  (1.778 m), weight 79.4 kg, SpO2 99%.  Examination:  Physical Exam Constitutional:      General: He is not in acute distress.    Appearance: Normal appearance.  HENT:     Head: Normocephalic and atraumatic.     Mouth/Throat:     Mouth: Mucous membranes are moist.  Eyes:     Extraocular Movements: Extraocular movements intact.  Cardiovascular:     Rate and Rhythm: Normal rate and regular rhythm.  Pulmonary:     Effort: Pulmonary effort is normal. No respiratory distress.     Breath sounds: Normal breath sounds. No wheezing.  Abdominal:     General: Bowel sounds are normal. There is no distension.     Palpations: Abdomen is soft.     Tenderness: There is no abdominal tenderness.  Musculoskeletal:        General: Normal range of motion.     Cervical back: Normal range of motion and neck supple.  Skin:    General: Skin is warm and dry.  Neurological:     Mental Status: He is alert.     Comments: Left facial droop and dysarthria that improves with distraction and ongoing conversation.  Flaccid weakness involving left upper and lower extremity.  Hoover sign positive with hip testing.  Reported paresthesia involving  left upper and lower extremity  Psychiatric:        Mood and Affect: Mood normal.        Behavior: Behavior normal.      Consultants:  Neurology  Procedures:    Data Reviewed: Results for orders placed or performed during the hospital encounter of 08/21/23 (from the past 24 hours)  CBG monitoring, ED     Status: Abnormal   Collection Time: 08/21/23  6:07 PM  Result Value Ref Range   Glucose-Capillary 208 (H) 70 - 99 mg/dL  I-stat chem 8, ed     Status: Abnormal   Collection Time: 08/21/23  6:14 PM  Result Value Ref Range   Sodium 135 135 - 145 mmol/L   Potassium 5.8 (H) 3.5 - 5.1 mmol/L   Chloride 102 98 - 111 mmol/L    BUN 15 6 - 20 mg/dL   Creatinine, Ser 1.61 0.61 - 1.24 mg/dL   Glucose, Bld 096 (H) 70 - 99 mg/dL   Calcium , Ion 1.01 (L) 1.15 - 1.40 mmol/L   TCO2 24 22 - 32 mmol/L   Hemoglobin 13.3 13.0 - 17.0 g/dL   HCT 04.5 40.9 - 81.1 %  Ethanol     Status: None   Collection Time: 08/21/23  6:26 PM  Result Value Ref Range   Alcohol, Ethyl (B) <15 <15 mg/dL  Protime-INR     Status: None   Collection Time: 08/21/23  6:26 PM  Result Value Ref Range   Prothrombin Time 13.2 11.4 - 15.2 seconds   INR 1.0 0.8 - 1.2  APTT     Status: None   Collection Time: 08/21/23  6:26 PM  Result Value Ref Range   aPTT 28 24 - 36 seconds  CBC     Status: None   Collection Time: 08/21/23  6:26 PM  Result Value Ref Range   WBC 7.8 4.0 - 10.5 K/uL   RBC 4.31 4.22 - 5.81 MIL/uL   Hemoglobin 13.6 13.0 - 17.0 g/dL   HCT 91.4 78.2 - 95.6 %   MCV 93.7 80.0 - 100.0 fL   MCH 31.6 26.0 - 34.0 pg   MCHC 33.7 30.0 - 36.0 g/dL   RDW 21.3 08.6 - 57.8 %   Platelets 244 150 - 400 K/uL   nRBC 0.0 0.0 - 0.2 %  Differential     Status: None   Collection Time: 08/21/23  6:26 PM  Result Value Ref Range   Neutrophils Relative % 65 %   Neutro Abs 5.1 1.7 - 7.7 K/uL   Lymphocytes Relative 30 %   Lymphs Abs 2.4 0.7 - 4.0 K/uL   Monocytes Relative 4 %   Monocytes Absolute 0.3 0.1 - 1.0 K/uL   Eosinophils Relative 1 %   Eosinophils Absolute 0.1 0.0 - 0.5 K/uL   Basophils Relative 0 %   Basophils Absolute 0.0 0.0 - 0.1 K/uL   Immature Granulocytes 0 %   Abs Immature Granulocytes 0.01 0.00 - 0.07 K/uL  Urine rapid drug screen (hosp performed)     Status: Abnormal   Collection Time: 08/21/23  6:26 PM  Result Value Ref Range   Opiates NONE DETECTED NONE DETECTED   Cocaine NONE DETECTED NONE DETECTED   Benzodiazepines NONE DETECTED NONE DETECTED   Amphetamines NONE DETECTED NONE DETECTED   Tetrahydrocannabinol POSITIVE (A) NONE DETECTED   Barbiturates NONE DETECTED NONE DETECTED  Comprehensive metabolic panel with GFR      Status: Abnormal   Collection Time: 08/21/23  7:03 PM  Result Value Ref Range   Sodium 135 135 - 145 mmol/L   Potassium 4.4 3.5 - 5.1 mmol/L   Chloride 103 98 - 111 mmol/L   CO2 24 22 - 32 mmol/L   Glucose, Bld 169 (H) 70 - 99 mg/dL   BUN 12 6 - 20 mg/dL   Creatinine, Ser 1.47 0.61 - 1.24 mg/dL   Calcium  8.7 (L) 8.9 - 10.3 mg/dL   Total Protein 5.4 (L) 6.5 - 8.1 g/dL   Albumin 3.5 3.5 - 5.0 g/dL   AST 25 15 - 41 U/L   ALT 15 0 - 44 U/L   Alkaline Phosphatase 52 38 - 126 U/L   Total Bilirubin 0.9 0.0 - 1.2 mg/dL   GFR, Estimated >82 >95 mL/min   Anion gap 8 5 - 15  Troponin I (High Sensitivity)     Status: None   Collection Time: 08/21/23 10:48 PM  Result Value Ref Range   Troponin I (High Sensitivity) 3 <18 ng/L  MRSA Next Gen by PCR, Nasal     Status: Abnormal   Collection Time: 08/21/23 11:25 PM   Specimen: Nasal Mucosa; Nasal Swab  Result Value Ref Range   MRSA by PCR Next Gen DETECTED (A) NOT DETECTED  Lipid panel     Status: Abnormal   Collection Time: 08/22/23 12:49 AM  Result Value Ref Range   Cholesterol 171 0 - 200 mg/dL   Triglycerides 78 <621 mg/dL   HDL 40 (L) >30 mg/dL   Total CHOL/HDL Ratio 4.3 RATIO   VLDL 16 0 - 40 mg/dL   LDL Cholesterol 865 (H) 0 - 99 mg/dL  Troponin I (High Sensitivity)     Status: None   Collection Time: 08/22/23 12:49 AM  Result Value Ref Range   Troponin I (High Sensitivity) 4 <18 ng/L    I have reviewed pertinent nursing notes, vitals, labs, and images as necessary. I have ordered labwork to follow up on as indicated.  I have reviewed the last notes from staff over past 24 hours. I have discussed patient's care plan and test results with nursing staff, CM/SW, and other staff as appropriate.  Time spent: Greater than 50% of the 55 minute visit was spent in counseling/coordination of care for the patient as laid out in the A&P.   LOS: 0 days   Faith Homes, MD [facility] 08/22/2023, 2:16 PM

## 2023-08-22 NOTE — Progress Notes (Signed)
 Pt noted with heart rate running in 30s nonsustaining, noted with 2.44 second pause. Pt assessed no change from previous assessment. Pt reports headache 9/10. Last morphine  dose administered at 0324. Dr. Achilles Holes notified, new order for atropine 0.5 mg IV x1.

## 2023-08-22 NOTE — Plan of Care (Signed)
   Problem: Education: Goal: Knowledge of disease or condition will improve Outcome: Progressing   Problem: Ischemic Stroke/TIA Tissue Perfusion: Goal: Complications of ischemic stroke/TIA will be minimized Outcome: Progressing

## 2023-08-22 NOTE — Evaluation (Signed)
 Speech Language Pathology Evaluation Patient Details Name: Matthew Armstrong. MRN: [id number] DOB: 08-May-1980 Today's Date: 08/22/2023 Time: 4098-1191 SLP Time Calculation (min) (ACUTE ONLY): 14 min  Problem List:  Patient Active Problem List   Diagnosis Date Noted   Chest pain 08/21/2023   Paroxysmal atrial fibrillation (HCC) 08/21/2023   HTN (hypertension) 08/21/2023   HLD (hyperlipidemia) 08/21/2023   Stroke (cerebrum) (HCC) 04/24/2023   Acute left-sided weakness 01/29/2023   Lumbosacral disc herniation 01/29/2023   AMS (altered mental status) 01/28/2023   Major depressive disorder with current active episode 01/07/2023   Poor social situation 01/06/2023   Functional neurological symptom disorder with weakness or paralysis 01/04/2023   Past Medical History:  Past Medical History:  Diagnosis Date   Atrial fibrillation (HCC)    Bladder disorder    Functional neurological symptom disorder with mixed symptoms    Hypokalemia 01/04/2023   Neurogenic bladder    Stroke [facility]) 2009   Past Surgical History:  Past Surgical History:  Procedure Laterality Date   BACK SURGERY     HPI:  43 y.o. male with medical history significant of A-fib with history of Eliquis  noncompliance, spinal cord injury with neurogenic bladder, hypertension, hyperlipidemia, tobacco and marijuana abuse, anxiety, PTSD, chronic pain syndrome, reported previous strokes, functional neurologic symptoms disorder presents to the ED via EMS as code stroke from [facility] for evaluation of left-sided weakness/numbness, left facial droop, and dysarthria.   Assessment / Plan / Recommendation Clinical Impression  Pt reports worsening cognitive linguistics and no hx of dysarthria or cognitive communication deficits. Pt has been seen by speech therapy during prior workup for CVA on previous admissions. He had reports of mild moderate dysarthria and mild neurocognitive disorder last documented 04/2023 and dating back  to 2022. MRI of head this admission revealed no acute intracranial abnormalities. Suspect pt to be close to or near baseline for both speech and language and cognitive linguistics given deficits noted on stay in February and unremarkable imaging this admit. He was oriented to place, month, but had mild errors in temporial orientation to day of the week and day of the month. Expressive and receptive language were intact. Motor speech errors were consist with dysarthria of speech and left facial droop noted on previous admission. Pt remained largely intelligible with speech despite some slurring.  Please reconsult if any acute changes are noted with cognitive linguistics.    SLP Assessment  SLP Recommendation/Assessment: Patient does not need any further Speech Language Pathology Services SLP Visit Diagnosis: Dysarthria and anarthria (R47.1)     Assistance Recommended at Discharge     Functional Status Assessment Patient has not had a recent decline in their functional status  Frequency and Duration           SLP Evaluation Cognition  Overall Cognitive Status: History of cognitive impairments - at baseline Orientation Level: Oriented X4 Year: 2025 Day of Week: Incorrect Attention: Focused;Sustained Focused Attention: Impaired Focused Attention Impairment: Verbal complex;Functional complex Memory: Impaired Memory Impairment: Decreased short term memory;Decreased recall of new information Decreased Short Term Memory: Verbal complex;Functional complex Problem Solving: Impaired Problem Solving Impairment: Functional complex;Verbal complex Executive Function: Organizing;Sequencing Sequencing: Impaired Sequencing Impairment: Verbal complex;Functional complex Organizing: Impaired Organizing Impairment: Verbal complex;Functional complex       Comprehension  Auditory Comprehension Overall Auditory Comprehension: Appears within functional limits for tasks assessed    Expression Verbal  Expression Overall Verbal Expression: Appears within functional limits for tasks assessed   Oral / Motor  Oral  Motor/Sensory Function Overall Oral Motor/Sensory Function: Mild impairment Facial ROM: Reduced left;Suspected CN VII (facial) dysfunction Facial Symmetry: Abnormal symmetry left Facial Strength: Reduced left Facial Sensation: Reduced left Lingual ROM: Reduced left Motor Speech Overall Motor Speech: Impaired at baseline Respiration: Within functional limits Articulation: Impaired Level of Impairment: Sentence Intelligibility: Intelligible            Tanner Fanny MA, CCC-SLP Acute Rehabilitation Services   08/22/2023, 10:41 AM

## 2023-08-22 NOTE — Progress Notes (Signed)
 STROKE TEAM PROGRESS NOTE   SUBJECTIVE (INTERVAL HISTORY) Jail guard is at the bedside.  Overall his condition is stable.  Still complaining of left facial droop, slurred speech and left sided weakness.  However on exam, patient did have inconsistency, forehead split, hoover's sign, etc functional component.  MRI negative for stroke.  Patient had history of multiple similar presentation with negative stroke workup in the past.   OBJECTIVE Temp:  [98 F (36.7 C)-98.9 F (37.2 C)] 98.1 F (36.7 C) (06/07 1135) Pulse Rate:  [54-69] 54 (06/07 1135) Cardiac Rhythm: Normal sinus rhythm (06/07 0702) Resp:  [18-20] 20 (06/07 1135) BP: (104-124)/(65-75) 109/74 (06/07 1135) SpO2:  [98 %-100 %] 99 % (06/07 1135) Weight:  [79.4 kg] 79.4 kg (06/06 1858)  Recent Labs  Lab 08/21/23 1807  GLUCAP 208*   Recent Labs  Lab 08/21/23 1814 08/21/23 1903  NA 135 135  K 5.8* 4.4  CL 102 103  CO2  --  24  GLUCOSE 192* 169*  BUN 15 12  CREATININE 0.90 0.81  CALCIUM   --  8.7*   Recent Labs  Lab 08/21/23 1903  AST 25  ALT 15  ALKPHOS 52  BILITOT 0.9  PROT 5.4*  ALBUMIN 3.5   Recent Labs  Lab 08/21/23 1814 08/21/23 1826  WBC  --  7.8  NEUTROABS  --  5.1  HGB 13.3 13.6  HCT 39.0 40.4  MCV  --  93.7  PLT  --  244   No results for input(s): "CKTOTAL", "CKMB", "CKMBINDEX", "TROPONINI" in the last 168 hours. Recent Labs    08/21/23 1826  LABPROT 13.2  INR 1.0   No results for input(s): "COLORURINE", "LABSPEC", "PHURINE", "GLUCOSEU", "HGBUR", "BILIRUBINUR", "KETONESUR", "PROTEINUR", "UROBILINOGEN", "NITRITE", "LEUKOCYTESUR" in the last 72 hours.  Invalid input(s): "APPERANCEUR"     Component Value Date/Time   CHOL 171 08/22/2023 0049   TRIG 78 08/22/2023 0049   HDL 40 (L) 08/22/2023 0049   CHOLHDL 4.3 08/22/2023 0049   VLDL 16 08/22/2023 0049   LDLCALC 115 (H) 08/22/2023 0049   Lab Results  Component Value Date   HGBA1C 5.2 04/24/2023      Component Value Date/Time    LABOPIA NONE DETECTED 08/21/2023 1826   COCAINSCRNUR NONE DETECTED 08/21/2023 1826   LABBENZ NONE DETECTED 08/21/2023 1826   AMPHETMU NONE DETECTED 08/21/2023 1826   THCU POSITIVE (A) 08/21/2023 1826   LABBARB NONE DETECTED 08/21/2023 1826    Recent Labs  Lab 08/21/23 1826  ETH <15    I have personally reviewed the radiological images below and agree with the radiology interpretations.  MR BRAIN WO CONTRAST Result Date: 08/21/2023 CLINICAL DATA:  Acute neurologic deficit EXAM: MRI HEAD WITHOUT CONTRAST TECHNIQUE: Multiplanar, multiecho pulse sequences of the brain and surrounding structures were obtained without intravenous contrast. COMPARISON:  04/25/2023 FINDINGS: Brain: No acute infarct, mass effect or extra-axial collection. No acute or chronic hemorrhage. Normal white matter signal, parenchymal volume and CSF spaces. The midline structures are normal. Vascular: Normal flow voids. Skull and upper cervical spine: Normal calvarium and skull base. Visualized upper cervical spine and soft tissues are normal. Sinuses/Orbits:Left maxillary sinus retention cyst. Normal orbits. Trace right mastoid fluid. IMPRESSION: Normal MRI of the brain. Electronically Signed   By: Juanetta Nordmann M.D.   On: 08/21/2023 22:30   DG CHEST PORT 1 VIEW Result Date: 08/21/2023 CLINICAL DATA:  Shortness of breath EXAM: PORTABLE CHEST 1 VIEW COMPARISON:  06/15/2023 FINDINGS: The heart size and mediastinal contours are within normal  limits. Both lungs are clear. The visualized skeletal structures are unremarkable. IMPRESSION: No active disease. Electronically Signed   By: Janeece Mechanic M.D.   On: 08/21/2023 21:28   CT ANGIO HEAD NECK W WO CM W PERF (CODE STROKE) Result Date: 08/21/2023 CLINICAL DATA:  Neuro deficit, concern for stroke. EXAM: CT ANGIOGRAPHY HEAD AND NECK CT PERFUSION BRAIN TECHNIQUE: Multidetector CT imaging of the head and neck was performed using the standard protocol during bolus administration of  intravenous contrast. Multiplanar CT image reconstructions and MIPs were obtained to evaluate the vascular anatomy. Carotid stenosis measurements (when applicable) are obtained utilizing NASCET criteria, using the distal internal carotid diameter as the denominator. Multiphase CT imaging of the brain was performed following IV bolus contrast injection. Subsequent parametric perfusion maps were calculated using RAPID software. RADIATION DOSE REDUCTION: This exam was performed according to the departmental dose-optimization program which includes automated exposure control, adjustment of the mA and/or kV according to patient size and/or use of iterative reconstruction technique. CONTRAST:  OMNIPAQUE  IOHEXOL  350 MG/ML SOLN COMPARISON:  Earlier same day head CT. CTA head and neck 04/24/2023. FINDINGS: CTA NECK FINDINGS Aortic arch: Standard configuration of the aortic arch. Imaged portion shows no evidence of aneurysm or dissection. No significant stenosis of the major arch vessel origins. Pulmonary arteries: As permitted by contrast timing, there are no filling defects in the visualized pulmonary arteries. Subclavian arteries: The subclavian arteries are patent bilaterally. Right carotid system: No evidence of dissection, stenosis (50% or greater), or occlusion. Left carotid system: No evidence of dissection, stenosis (50% or greater), or occlusion. Vertebral arteries: Codominant. No evidence of dissection, stenosis (50% or greater), or occlusion. Skeleton: No acute or aggressive finding noted. Other neck: The visualized airway is patent. No cervical lymphadenopathy. Upper chest: Visualized lung apices are clear. Review of the MIP images confirms the above findings CTA HEAD FINDINGS ANTERIOR CIRCULATION: The intracranial ICAs are patent bilaterally. No significant stenosis, proximal occlusion, aneurysm, or vascular malformation. MCAs: The middle cerebral arteries are patent bilaterally. ACAs: The anterior  cerebral arteries are patent bilaterally. POSTERIOR CIRCULATION: No significant stenosis, proximal occlusion, aneurysm, or vascular malformation. PCAs: Patent bilaterally.  Fetal origin of the right PCA. Pcomm: The posterior communicating arteries are visualized bilaterally. SCAs: The superior cerebellar arteries are patent bilaterally. Basilar artery: Patent AICAs: Small caliber AICA visualized on the right. PICAs: Visualized on the left. Vertebral arteries: The intracranial vertebral arteries are patent. Venous sinuses: As permitted by contrast timing, patent. Anatomic variants: None Review of the MIP images confirms the above findings CT Brain Perfusion Findings: ASPECTS: 10 CBF (<30%) Volume: 0mL Perfusion (Tmax>6.0s) volume: 0mL Mismatch Volume: 0mL Infarction Location:None identified IMPRESSION: No large vessel occlusion. No core infarct identified on CT perfusion. No high-grade stenosis, aneurysm, or dissection of the arteries in the head and neck. Electronically Signed   By: Denny Flack M.D.   On: 08/21/2023 18:57   CT HEAD CODE STROKE WO CONTRAST Result Date: 08/21/2023 CLINICAL DATA:  Code stroke.  Neuro deficit, concern for stroke. EXAM: CT HEAD WITHOUT CONTRAST TECHNIQUE: Contiguous axial images were obtained from the base of the skull through the vertex without intravenous contrast. RADIATION DOSE REDUCTION: This exam was performed according to the departmental dose-optimization program which includes automated exposure control, adjustment of the mA and/or kV according to patient size and/or use of iterative reconstruction technique. COMPARISON:  MRI head 04/25/2023. FINDINGS: Brain: No acute intracranial hemorrhage. No CT evidence of acute infarct. No edema, mass effect, or midline shift.  The basilar cisterns are patent. Ventricles: The ventricles are normal. Vascular: No hyperdense vessel or unexpected calcification. Skull: No acute or aggressive finding. Orbits: Orbits are symmetric. Sinuses:  Mucosal thickening and possible mucous retention cysts in the left maxillary sinus. Other: Mastoid air cells are clear. ASPECTS [facility] Stroke Program Early CT Score) - Ganglionic level infarction (caudate, lentiform nuclei, internal capsule, insula, M1-M3 cortex): 7 - Supraganglionic infarction (M4-M6 cortex): 3 Total score (0-10 with 10 being normal): 10 IMPRESSION: 1. No CT evidence of acute intracranial abnormality. 2. ASPECTS is 10 These results were communicated to Dr. Doretta Gant At 6:24 pm on 08/21/2023 by text page via the [facility] messaging system. Electronically Signed   By: Denny Flack M.D.   On: 08/21/2023 18:24     PHYSICAL EXAM  Temp:  [98 F (36.7 C)-98.9 F (37.2 C)] 98.1 F (36.7 C) (06/07 1135) Pulse Rate:  [54-69] 54 (06/07 1135) Resp:  [18-20] 20 (06/07 1135) BP: (104-124)/(65-75) 109/74 (06/07 1135) SpO2:  [98 %-100 %] 99 % (06/07 1135) Weight:  [79.4 kg] 79.4 kg (06/06 1858)  General - Well nourished, well developed, in no apparent distress.  Ophthalmologic - fundi not visualized due to noncooperation.  Cardiovascular - Regular rhythm and rate.  Neuro - awake, alert, eyes open, orientated to age, place, time. No aphasia, fluent language but with mild to moderate dysarthria which is distractible, following all simple commands. Able to name and repeat in dysarthric voice. No gaze palsy, tracking bilaterally, visual field full, PERRL. Facial asymmetry with right facial elevation higher than left, but no facial weakness. Tongue protrusion to the right, which is not consistent with his left sided weakness. RUE and RLE 5/5, LUE lack of effort with inconsistent strength and delayed drop. LLE with hoover's sign. Sensation diminished on the left but forehead split, right FTN intact, gait not tested.     ASSESSMENT/PLAN [name]. is a 42 y.o. male with history of spinal cord injury with neurogenic bladder, hypertension, PTSD, functional neurologic disorder, smoker, THC  use admitted for recurrent episode of left-sided weakness and dysarthria. No TNK given due to outside window.    Functional neurological disorder - likely malingering, DDx including complicated migraine but less likely Recurrent episode of left-sided weakness and dysarthria CT no acute abnormality CT head and neck unremarkable CTP no perfusion deficit MRI no acute infarct 2D Echo 04/2023 EF 60 to 65% LDL 115 HgbA1c pending UDS positive for THC SCDs for VTE prophylaxis aspirin  81 mg daily prior to admission, now on aspirin  81 mg daily.  Continue on discharge Ongoing aggressive stroke risk factor management Therapy recommendations: Home health Disposition: Pending, may need transfer to [facility] facility for therapy sessions.  History of functional neurological disorder Patient reported history of strokes, no imaging evidence of chronic strokes  04/2023 admitted again for slurred speech, left-sided weakness and aphasia.  Status post TNK.  CT head and neck unremarkable MRI negative for stroke.  EF 60 to 65%.  LDL 116, A1c 5.2.  Discharged on aspirin  and Lipitor  80 as well as Zetia. Presentation to [facility] with left-sided weakness, left facial droop, and dysarthria 01/28/2023 with non-organic findings including Hoover's sign, subjective hemisensory loss with reaction to noxious stimuli, and fluctuating dysarthria and facial weakness. MRI brain negative at that time.  Presented to [facility] with left-sided weakness and aphasia 01/04/2023 and MRI negative for acute process again with volitional facial weakness and left hemiparesis Presentation in July 2021 with consistent symptoms of left flaccid  hemiparesis, dysarthria, and left facial weakness that was noted to be volitional. MRI negative 10/14/2019 and patient presentation was felt to be psychogenic weakness versus complex migraine headache November 2020 presented to [facility] with left-sided weakness and numbness and was given TPA. Documentation  during this admission concerning for non-organic etiology/malingering Repeat presentations to [facility] 02/20/2019, 03/04/2019, and 09/30/2020 all with fluctuating symptoms and negative imaging. Presented again 05/09/21 for [facility] with left hemibody weakness and numbness with findings suggestive of functional etiology of presentation.  Presented to [facility] 6/21 with complaints and findings consistent with previous presentations.  [facility] health August 2024, September 2024, [place] June 2024 with similar reported symptoms. Previously unable to obtain MRI imaging due to bladder stimulator though all CTH and CT angiography imaging has been unremarkable during previous evaluations.  Recommend outpatient psychiatry and psychology follow-up  No clear hx of afib Per note from last admission 04/2023: "Eliquis  initiated between August and September of 2024 without documentation for review from prescribing provider on [facility]; no documented afib in [facility] or [place] chart review. We will NOT resume eliquis  at discharge".  Continue telemetry monitoring Continue ASA on discharge  Hypertension Stable Long term BP goal normotensive  Hyperlipidemia Home meds: Lipitor  80 LDL 115, goal < 100 Now on Lipitor  80 And Zetia 10 Continue statin and Zetia at discharge  Tobacco abuse Current smoker Smoking cessation counseling provided Pt is willing to quit  Other Stroke Risk Factors Substance abuse, UDS positive for THC.  Cessation education provided  Other Active Problems History of spinal cord injury with neurogenic bladder PTSD  Hospital day # 0  Neurology will sign off. Please call with questions.  No neuro follow-up needed at this time.  Thanks for the consult.   Consuelo Denmark, MD PhD Stroke Neurology 08/22/2023 3:51 PM    To contact Stroke Continuity provider, please refer to [email or web address]. After hours, contact General Neurology

## 2023-08-22 NOTE — Evaluation (Signed)
 Physical Therapy Evaluation Patient Details Name: Matthew Armstrong. MRN: [id number] DOB: Jan 01, 1981 Today's Date: 08/22/2023  History of Present Illness  43 y.o. male presents to [facility] hospital on 08/21/23 for L sided weakness. MRI and CT negative. Last admitted on 2/7 with L weakness and aphasia, had TNK and CT negative. PMH includes afib, HTN, anxiety, SCI with neurogenic bladder, CVA, PTSD, chronic pain.   Clinical Impression  Pt presents with condition above and deficits mentioned below, see PT Problem List. The pt is currently in prison and reports he is independent with intermittent use of a rollator or w/c at baseline. Currently, the pt is demonstrating inconsistencies in regards to L-sided weakness. Intermittently he would speak more clearly and with more initiation of his L facial musculature and would demonstrate inconsistent activation of his L leg during bed mobility and sit to stand and step pivot transfers with a RW. When cued to try to move his L leg while in bed he was unable to do so, but was able to extend his L knee against gravity to hold it up during transitioning supine to sit EOB and was able to intermittently lift it to take steps. He reported feeling like his L knee was going to buckle when standing, but no appreciative buckling or instability noted as he tends to be able to hold the knee stiffly extended when standing. He also reports no sensation throughout his L side. He is currently requiring minA for bed mobility and modA to stand and take a few pivotal steps with a RW. He is at high risk for falls at this time. He did state "they (police) said they cannot take me back (to prison) if I cannot walk". Anticipating the pt will quickly improve considering his MRI and CT of his brain were negative, thus recommending follow-up with HHPT at his prison if able. However, if he does not progress as anticipated, he may benefit from short-term inpatient rehab if able prior to return to his  facility. Will continue to follow acutely.      If plan is discharge home, recommend the following: Two people to help with walking and/or transfers;A lot of help with bathing/dressing/bathroom;Assistance with cooking/housework;Direct supervision/assist for medications management;Direct supervision/assist for financial management;Assist for transportation;Help with stairs or ramp for entrance   Can travel by private vehicle        Equipment Recommendations Rolling walker (2 wheels);BSC/3in1;Wheelchair (measurements PT);Wheelchair cushion (measurements PT)  Recommendations for Other Services       Functional Status Assessment Patient has had a recent decline in their functional status and demonstrates the ability to make significant improvements in function in a reasonable and predictable amount of time.     Precautions / Restrictions Precautions Precautions: Fall Restrictions Weight Bearing Restrictions Per Provider Order: No      Mobility  Bed Mobility Overal bed mobility: Needs Assistance Bed Mobility: Supine to Sit     Supine to sit: Min assist, HOB elevated, Used rails     General bed mobility comments: HOB elevated and pt heavily using L bed rail with his R UE to scoot his hips and pull up to sit with minA at L leg initially and then at trunk.    Transfers Overall transfer level: Needs assistance Equipment used: Rolling walker (2 wheels) Transfers: Sit to/from Stand, Bed to chair/wheelchair/BSC Sit to Stand: Mod assist   Step pivot transfers: Mod assist       General transfer comment: Pt tends to rest with his L  ankle inverted and L UE flexed in his lap. When cued to flatten his L foot he would move it and place it using his R UE. Pt stood from EOB 2x. ModA needed to power up, shift weight anteriorly, gain balance, and extend hips as he tends to lean back and flex at his hips/trunk. Pt shakes throughout his body when standing, but it varies in intensity and he is  able to maintain a conversation throughout, seems more effortful muscle activation shaking. Pt's L hand would slip off the RW at times and need assistance to return to the hand grip. ModA needed for balance, RW management, and intermittently advancing his L foot to step to L from bed to chair. Pt varied in his ability to move his L leg to step    Ambulation/Gait Ambulation/Gait assistance: Mod assist Gait Distance (Feet): 2 Feet Assistive device: Rolling walker (2 wheels) Gait Pattern/deviations: Step-to pattern, Decreased step length - left, Decreased stride length, Decreased dorsiflexion - left, Trunk flexed, Leaning posteriorly Gait velocity: reduced Gait velocity interpretation: <1.31 ft/sec, indicative of household ambulator   General Gait Details: Pt leans posteriorly and flexes at his trunk. He tends to invert his L ankle and needs cues to flatten his foot for safety. Pt's ability to move his L leg to take steps with his L varies, intermittently requiring assistance to do so. He reports he feels like his L leg is going to buckle but no appreciative buckling or instability in the knee noted. ModA for balance and RW management to step pivot to chair. Will likely need +2 to advance gait further for safety at this time.  Stairs            Wheelchair Mobility     Tilt Bed    Modified Rankin (Stroke Patients Only) Modified Rankin (Stroke Patients Only) Pre-Morbid Rankin Score: No significant disability Modified Rankin: Moderately severe disability     Balance Overall balance assessment: Needs assistance Sitting-balance support: No upper extremity supported, Feet supported Sitting balance-Leahy Scale: Fair Sitting balance - Comments: static sitting EOB with supervision for safety Postural control: Posterior lean Standing balance support: Bilateral upper extremity supported, During functional activity, Reliant on assistive device for balance Standing balance-Leahy Scale:  Poor Standing balance comment: reliant on RW and modA, leans posteriorly                             Pertinent Vitals/Pain Pain Assessment Pain Assessment: Faces Faces Pain Scale: Hurts little more Pain Location: L side Pain Descriptors / Indicators: Discomfort, Grimacing, Moaning Pain Intervention(s): Limited activity within patient's tolerance, Monitored during session, Repositioned    Home Living Family/patient expects to be discharged to:: [facility]   Available Help at Discharge: Family;Available 24 hours/day (20 year old mother in law nephew) Type of Home:  (motel per pt)             Additional Comments: reports he has a rollator and w/c    Prior Function Prior Level of Function : Independent/Modified Independent             Mobility Comments: uses rollator and w/c at times, but seemingly usually independent       Extremity/Trunk Assessment   Upper Extremity Assessment Upper Extremity Assessment: Defer to OT evaluation    Lower Extremity Assessment Lower Extremity Assessment: LLE deficits/detail LLE Deficits / Details: inconsistencies noted in regards to strength throughout session; when cued to wiggle toes, he displayed no  active movement. When cued to sit up EOB he was able to hold his L knee in extension and noted movement in his ankle and foot with other mobility yet rests with his L ankle inverted. When cued to place his L foot flat he often picks it up and moves it with his R UE instead of actively moving it. His ability to move his L leg to take steps with his L varied in standing; when educated on sensory testing, pt denied any sensation in the entire L side and did not withdraw to noxious stimuli LLE Sensation: decreased light touch LLE Coordination: decreased gross motor;decreased fine motor    Cervical / Trunk Assessment Cervical / Trunk Assessment: Normal  Communication   Communication Communication: Impaired Factors Affecting  Communication: Reduced clarity of speech (speaks out of the R side of his mouth majority of time, but a couple times moved the L side well also to speak more clearly when pt seemingly was distracted in conversation)    Cognition Arousal: Alert Behavior During Therapy: [facility] for tasks assessed/performed   PT - Cognitive impairments: Sequencing, Awareness, Safety/Judgement                       PT - Cognition Comments: Needs cues to sequence mobility and for awareness of unsafe L foot placement and need to correct it prior to standing. Following commands: Intact       Cueing Cueing Techniques: Verbal cues, Tactile cues, Visual cues     General Comments General comments (skin integrity, edema, etc.): VSS on RA, HR up to 100s with standing activity    Exercises     Assessment/Plan    PT Assessment Patient needs continued PT services  PT Problem List Decreased strength;Decreased activity tolerance;Decreased balance;Decreased mobility;Decreased cognition;Decreased coordination;Impaired sensation       PT Treatment Interventions DME instruction;Stair training;Gait training;Functional mobility training;Therapeutic activities;Therapeutic exercise;Balance training;Neuromuscular re-education;Patient/family education;Cognitive remediation;Wheelchair mobility training    PT Goals (Current goals can be found in the Care Plan section)  Acute Rehab PT Goals Patient Stated Goal: to get better PT Goal Formulation: With patient Time For Goal Achievement: 09/05/23 Potential to Achieve Goals: Good    Frequency Min 2X/week     Co-evaluation               AM-PAC PT "6 Clicks" Mobility  Outcome Measure Help needed turning from your back to your side while in a flat bed without using bedrails?: A Little Help needed moving from lying on your back to sitting on the side of a flat bed without using bedrails?: A Little Help needed moving to and from a bed to a chair (including a  wheelchair)?: A Lot Help needed standing up from a chair using your arms (e.g., wheelchair or bedside chair)?: A Lot Help needed to walk in hospital room?: Total Help needed climbing 3-5 steps with a railing? : Total 6 Click Score: 12    End of Session Equipment Utilized During Treatment: Gait belt Activity Tolerance: Patient tolerated treatment well Patient left: in chair;with call bell/phone within reach;with chair alarm set;Other (comment) (police man at room entrance) Nurse Communication: Mobility status PT Visit Diagnosis: Unsteadiness on feet (R26.81);Other abnormalities of gait and mobility (R26.89);Muscle weakness (generalized) (M62.81);Difficulty in walking, not elsewhere classified (R26.2);Other symptoms and signs involving the nervous system (R29.898);Hemiplegia and hemiparesis Hemiplegia - Right/Left: Left Hemiplegia - caused by: Unspecified    Time: 1610-9604 PT Time Calculation (min) (ACUTE ONLY): 21 min  Charges:   PT Evaluation $PT Eval Moderate Complexity: 1 Mod   PT General Charges $$ ACUTE PT VISIT: 1 Visit         Vernida Goodie, PT, DPT Acute Rehabilitation Services  Office: [phone]   Ellyn Hack 08/22/2023, 11:12 AM

## 2023-08-22 NOTE — Hospital Course (Signed)
 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 He has been hospitalized within multiple health systems including [facility], [facility], [facility], [facility].   There is a very detailed neurology note from 04/26/2023 that should be reviewed for any future hospitalizations to aid with understanding patient's extensive previous workups.  For this hospitalization, patient presented with left-sided weakness/numbness along with "left facial droop", and "dysarthria".  He was admitted for stroke workup. Although he reports not being given Eliquis  in jail, he was discontinued from Eliquis  after 04/26/2023 neurology evaluation.  There were no obvious active prescriptions for Eliquis  noted on his med rec on admission.  He had only been incarcerated at the [place] [facility] since 08/17/2023.  Although patient reports prior history of stroke, no imaging workup has shown evidence of stroke thus far.  He again underwent MRI brain on admission which was again negative for acute abnormalities nor any evidence of CVA. CT angio head/neck also negative for LVO nor any high-grade stenosis in the head or neck. CT head also unremarkable. Lab workup was also rather unremarkable.  Despite negative workup, upon evaluation with physical and occupational Therapy, he exhibited dense left-sided deficits. His physical exam was unable to be replicated consistently raising further concern for malingering and functional neurologic symptoms.   Hoover sign was positive on exam as well. With ongoing conversation, his left facial droop and dysarthria almost fully resolved especially with distraction.  Due to this, he is not felt to be safe for discharging without further resources of some sort of rehab given the ongoing left-sided weakness.   Case will be discussed with jail for  further disposition planning.

## 2023-08-22 NOTE — Assessment & Plan Note (Addendum)
-   Continue Lipitor  - LDL 115

## 2023-08-22 NOTE — Evaluation (Signed)
 Occupational Therapy Evaluation Patient Details Name: Matthew Armstrong. MRN: [id number] DOB: 04-08-1980 Today's Date: 08/22/2023   History of Present Illness   43 y.o. male presents to [facility] hospital on 08/21/23 for L sided weakness. MRI and CT negative. Last admitted on 2/7 with L weakness and aphasia, had TNK and CT negative. PMH includes afib, HTN, anxiety, SCI with neurogenic bladder, CVA, PTSD, chronic pain.     Clinical Impressions Pt admitted for above, Pt reports that his spouse typically helps him bathe and dress at baseline, he notes a hx of CVA that has impacted his LUE but he is not able to use it as before. Pt currently not using LUE functionally, not able to perform AROM when prompted, it did not feel flaccid and it seemed like there was muscle activation during PROM from OT. He currently requires total A to Mod A for ADLs and mod A just to stand. He would strongly benefit from continued acute skilled OT services to address listed deficits and help transition to next level of care. Patient would benefit from post acute Home OT services to help maximize functional independence in natural environment      If plan is discharge home, recommend the following:   Two people to help with walking and/or transfers;Assistance with cooking/housework;Assist for transportation;Assistance with feeding;A lot of help with bathing/dressing/bathroom     Functional Status Assessment   Patient has had a recent decline in their functional status and/or demonstrates limited ability to make significant improvements in function in a reasonable and predictable amount of time     Equipment Recommendations   BSC/3in1;Other (comment) (RW)     Recommendations for Other Services         Precautions/Restrictions   Precautions Precautions: Fall Restrictions Weight Bearing Restrictions Per Provider Order: No     Mobility Bed Mobility Overal bed mobility: Needs Assistance Bed Mobility:  Supine to Sit, Sit to Supine     Supine to sit: Min assist, Used rails Sit to supine: Contact guard assist, Used rails   General bed mobility comments: Pt initially needing assist with LUE management, used BUEs to control and swing LLE prn.    Transfers Overall transfer level: Needs assistance Equipment used: 1 person hand held assist Transfers: Sit to/from Stand Sit to Stand: Mod assist           General transfer comment: Limited by back pain, barely able to acheive full upright position.      Balance Overall balance assessment: Needs assistance Sitting-balance support: No upper extremity supported, Feet supported Sitting balance-Leahy Scale: Fair     Standing balance support: Single extremity supported Standing balance-Leahy Scale: Poor Standing balance comment: Reliant on UE support.                           ADL either performed or assessed with clinical judgement   ADL Overall ADL's : Needs assistance/impaired Eating/Feeding: Set up;Sitting Eating/Feeding Details (indicate cue type and reason): RUE Grooming: Sitting;Oral care;Minimal assistance Grooming Details (indicate cue type and reason): RUE use Upper Body Bathing: Sitting;Moderate assistance   Lower Body Bathing: Sitting/lateral leans;Moderate assistance   Upper Body Dressing : Sitting;Maximal assistance   Lower Body Dressing: Sitting/lateral leans;Total assistance     Toilet Transfer Details (indicate cue type and reason): was not able to pivot with OT Toileting- Clothing Manipulation and Hygiene: Maximal assistance;Sit to/from stand       Functional mobility during ADLs: Moderate assistance (to  stand)       Vision         Perception         Praxis         Pertinent Vitals/Pain Pain Assessment Pain Assessment: Faces Faces Pain Scale: Hurts even more Pain Location: low back Pain Descriptors / Indicators: Discomfort, Grimacing, Moaning Pain Intervention(s): Monitored  during session, Limited activity within patient's tolerance, Repositioned     Extremity/Trunk Assessment Upper Extremity Assessment Upper Extremity Assessment: LUE deficits/detail;Right hand dominant LUE Deficits / Details: Pt reports no mobility from LUE, LUE without any distinguish in tone compared to RUE. He notes pain in his shoulder with PROM but numb throughout the rest of it. During PROM pt demonstrating full ROM but there were moments where pt would have muscle activation throughout the UE although he would not perform any trace activation of muscles when prompted. 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 LUE Sensation: decreased light touch;decreased proprioception LUE Coordination: decreased fine motor;decreased gross motor   Lower Extremity Assessment Lower Extremity Assessment: LLE deficits/detail LLE Deficits / Details: inconsistencies noted in regards to strength throughout session; when cued to wiggle toes, he displayed no active movement. When cued to sit up EOB he was able to hold his L knee in extension and noted movement in his ankle and foot with other mobility yet rests with his L ankle inverted. When cued to place his L foot flat he often picks it up and moves it with his R UE instead of actively moving it. His ability to move his L leg to take steps with his L varied in standing; when educated on sensory testing, pt denied any sensation in the entire L side and did not withdraw to noxious stimuli LLE Sensation: decreased light touch LLE Coordination: decreased gross motor;decreased fine motor   Cervical / Trunk Assessment Cervical / Trunk Assessment: Kyphotic   Communication Communication Communication: No apparent difficulties (speaks pretty clearly for OT, according to PT the pt was harder to understand and spoke mostly with R mouth movement) Factors Affecting Communication: Reduced clarity of speech (speaks out of the R side of his  mouth majority of time, but a couple times moved the L side well also to speak more clearly when pt seemingly was distracted in conversation)   Cognition Arousal: Alert Behavior During Therapy: [facility] for tasks assessed/performed Cognition: No family/caregiver present to determine baseline, No apparent impairments                               Following commands: Intact       Cueing  General Comments   Cueing Techniques: Verbal cues;Tactile cues  VSS on RA, HR up to 100s with standing activity   Exercises     Shoulder Instructions      Home Living Family/patient expects to be discharged to:: [facility]   Available Help at Discharge: Family;Available 24 hours/day (41 year old mother in law nephew) Type of Home:  (motel per pt)                           Additional Comments: reports he has a Occupational hygienist and w/c  Lives With: Significant other;Family    Prior Functioning/Environment Prior Level of Function : Independent/Modified Independent             Mobility Comments: uses rollator and w/c at times, but seemingly  usually independent ADLs Comments: wife helps patient dress and bathe, help transfer into garden tub    OT Problem List: Impaired UE functional use;Impaired balance (sitting and/or standing);Decreased strength;Pain   OT Treatment/Interventions: Self-care/ADL training;Patient/family education;Therapeutic exercise;Balance training;Therapeutic activities;DME and/or AE instruction      OT Goals(Current goals can be found in the care plan section)   Acute Rehab OT Goals Patient Stated Goal: To get back to functionally doing for myself OT Goal Formulation: With patient Time For Goal Achievement: 09/05/23 Potential to Achieve Goals: Fair ADL Goals Pt Will Perform Grooming: with set-up;sitting (using one handed strategies) Pt Will Perform Upper Body Dressing: sitting;with set-up (hemi dressing) Pt Will Transfer to Toilet: stand pivot  transfer;bedside commode;with min assist Pt/caregiver will Perform Home Exercise Program: Increased ROM;Increased strength;Left upper extremity;With written HEP provided;Independently   OT Frequency:  Min 2X/week    Co-evaluation              AM-PAC OT "6 Clicks" Daily Activity     Outcome Measure Help from another person eating meals?: None Help from another person taking care of personal grooming?: A Little Help from another person toileting, which includes using toliet, bedpan, or urinal?: A Lot Help from another person bathing (including washing, rinsing, drying)?: A Lot Help from another person to put on and taking off regular upper body clothing?: A Lot Help from another person to put on and taking off regular lower body clothing?: Total 6 Click Score: 14   End of Session Equipment Utilized During Treatment: Gait belt Nurse Communication: Mobility status  Activity Tolerance: Patient tolerated treatment well Patient left: in bed;with call bell/phone within reach;with family/visitor present  OT Visit Diagnosis: Other abnormalities of gait and mobility (R26.89);Unsteadiness on feet (R26.81);Muscle weakness (generalized) (M62.81);Hemiplegia and hemiparesis;Pain Hemiplegia - Right/Left: Left Hemiplegia - dominant/non-dominant: Non-Dominant Hemiplegia - caused by: Unspecified Pain - part of body:  (back)                Time: 4098-1191 OT Time Calculation (min): 20 min Charges:  OT General Charges $OT Visit: 1 Visit OT Evaluation $OT Eval Moderate Complexity: 1 Mod  08/22/2023  AB, OTR/L  Acute Rehabilitation Services  Office: [phone]   Jorene New 08/22/2023, 2:00 PM

## 2023-08-23 DIAGNOSIS — I10 Essential (primary) hypertension: Secondary | ICD-10-CM | POA: Diagnosis not present

## 2023-08-23 DIAGNOSIS — E785 Hyperlipidemia, unspecified: Secondary | ICD-10-CM | POA: Diagnosis not present

## 2023-08-23 DIAGNOSIS — R001 Bradycardia, unspecified: Secondary | ICD-10-CM | POA: Diagnosis not present

## 2023-08-23 DIAGNOSIS — F447 Conversion disorder with mixed symptom presentation: Secondary | ICD-10-CM

## 2023-08-23 DIAGNOSIS — R2990 Unspecified symptoms and signs involving the nervous system: Secondary | ICD-10-CM | POA: Diagnosis not present

## 2023-08-23 LAB — TSH: TSH: 2.038 u[IU]/mL (ref 0.350–4.500)

## 2023-08-23 LAB — GLUCOSE, CAPILLARY: Glucose-Capillary: 101 mg/dL — ABNORMAL HIGH (ref 70–99)

## 2023-08-23 MED ORDER — METOCLOPRAMIDE HCL 5 MG/ML IJ SOLN
10.0000 mg | Freq: Four times a day (QID) | INTRAMUSCULAR | Status: DC | PRN
  Administered 2023-08-23 – 2023-08-25 (×7): 10 mg via INTRAVENOUS
  Filled 2023-08-23 (×7): qty 2

## 2023-08-23 MED ORDER — DIPHENHYDRAMINE HCL 50 MG/ML IJ SOLN
12.5000 mg | Freq: Four times a day (QID) | INTRAMUSCULAR | Status: DC | PRN
  Administered 2023-08-23 – 2023-08-25 (×7): 12.5 mg via INTRAVENOUS
  Filled 2023-08-23 (×7): qty 1

## 2023-08-23 NOTE — Plan of Care (Signed)
   Problem: Education: Goal: Knowledge of disease or condition will improve Outcome: Progressing

## 2023-08-23 NOTE — Progress Notes (Signed)
 Occupational Therapy Treatment Patient Details Name: Matthew Armstrong. MRN: [id number] DOB: 05/30/1980 Today's Date: 08/23/2023   History of present illness 43 y.o. male presents to [facility] hospital on 08/21/23 for L sided weakness. MRI and CT negative. Last admitted on 2/7 with L weakness and aphasia, had TNK and CT negative. PMH includes afib, HTN, anxiety, SCI with neurogenic bladder, CVA, PTSD, chronic pain.   OT comments  Pt. Seen for skilled PT/OT treatment session.  Pt. Able to complete bed mobility with S.  Manages BLEs off of edge of bed with use of R hand.  Cues for B hand placement prior to standing along with adjusting L foot for stability.  Pt. Initially MOD A for sit/stand but able to progress to CGA by end of session.  UB/LB bathing with set up to MIN A. Pt. Initiates one handed tech. With use of R hand for most of bathing but asked for assistance for R underarm for thoroughness. (Refer to PT note for mobility details).  Pt. May benefit from shoes to aide with positioning and weight bearing of L foot.  Progressing with OT goals.  Cont. With acute OT POC.        If plan is discharge home, recommend the following:  Two people to help with walking and/or transfers;Assistance with cooking/housework;Assist for transportation;Assistance with feeding;A lot of help with bathing/dressing/bathroom   Equipment Recommendations  BSC/3in1;Other (comment)    Recommendations for Other Services      Precautions / Restrictions Precautions Precautions: Fall Recall of Precautions/Restrictions: Intact       Mobility Bed Mobility Overal bed mobility: Needs Assistance Bed Mobility: Supine to Sit, Sit to Supine     Supine to sit: HOB elevated, Used rails, Supervision Sit to supine: Supervision   General bed mobility comments: pt using RUE on rail and RUE to assist with movement of L extremities, no assist given    Transfers Overall transfer level: Needs assistance Equipment used: Rolling  walker (2 wheels) Transfers: Sit to/from Stand, Bed to chair/wheelchair/BSC Sit to Stand: Mod assist, Contact guard assist     Step pivot transfers: Mod assist, Contact guard assist     General transfer comment: Pt tends to rest with his L ankle inverted and L UE flexed in his lap. When cued to flatten his L foot he would move it and place it using his R UE. Pt stood from EOB 5x and chair x2. ModA  initially needed to power up, shift weight anteriorly, gain balance, and extend hips as he tends to lean back and flex at his hips/trunk. Pt shakes throughout his body when standing, but it varies in intensity and he is able to maintain a conversation throughout, seems more effortful muscle activation shaking. Once pt positioned LUE on RW, no issues with maintaining grip. through session, assist gradually decreased to CGA and pt was still able to complete transfers (still had increased sway and shaking, but no LOB or buckling). no buckling in L knee or L elbow with wt bearing     Balance                                           ADL either performed or assessed with clinical judgement   ADL Overall ADL's : Needs assistance/impaired Eating/Feeding: Set up;Bed level Eating/Feeding Details (indicate cue type and reason): bed in seated position Grooming: Wash/dry face;Set up;Sitting  Upper Body Bathing: Minimal assistance;Sitting;Cueing for compensatory techniques Upper Body Bathing Details (indicate cue type and reason): utilizing one handed tech. but would ask for help after Lower Body Bathing: Set up;Minimal assistance;Sitting/lateral leans;Sit to/from stand Lower Body Bathing Details (indicate cue type and reason): front peri area and legs seated with set up, min a standing for buttocks Upper Body Dressing : Minimal assistance;Sitting Upper Body Dressing Details (indicate cue type and reason): tie gown     Toilet Transfer: Minimal assistance;+2 for safety/equipment;Rolling  walker (2 wheels) Toilet Transfer Details (indicate cue type and reason): simulated with ambulation from eob across room to the sink, cues for hand placement and management of L hand and also remembering to reach back with R hand         Functional mobility during ADLs: Minimal assistance;Moderate assistance      Extremity/Trunk Assessment              Vision       Perception     Praxis     Communication Communication Communication: No apparent difficulties   Cognition Arousal: Alert Behavior During Therapy: WFL for tasks assessed/performed Cognition: No family/caregiver present to determine baseline, No apparent impairments                               Following commands: Intact        Cueing   Cueing Techniques: Verbal cues, Tactile cues, Visual cues  Exercises      Shoulder Instructions       General Comments      Pertinent Vitals/ Pain       Pain Assessment Pain Assessment: No/denies pain  Home Living                                          Prior Functioning/Environment              Frequency  Min 2X/week        Progress Toward Goals  OT Goals(current goals can now be found in the care plan section)  Progress towards OT goals: Progressing toward goals     Plan      Co-evaluation    PT/OT/SLP Co-Evaluation/Treatment: Yes     OT goals addressed during session: Proper use of Adaptive equipment and DME;ADL's and self-care      AM-PAC OT "6 Clicks" Daily Activity     Outcome Measure   Help from another person eating meals?: None Help from another person taking care of personal grooming?: A Little Help from another person toileting, which includes using toliet, bedpan, or urinal?: A Lot Help from another person bathing (including washing, rinsing, drying)?: A Lot Help from another person to put on and taking off regular upper body clothing?: A Lot Help from another person to put on and taking  off regular lower body clothing?: Total 6 Click Score: 14    End of Session Equipment Utilized During Treatment: Gait belt;Rolling walker (2 wheels)  OT Visit Diagnosis: Other abnormalities of gait and mobility (R26.89);Unsteadiness on feet (R26.81);Muscle weakness (generalized) (M62.81);Hemiplegia and hemiparesis;Pain Hemiplegia - Right/Left: Left Hemiplegia - dominant/non-dominant: Non-Dominant Hemiplegia - caused by: Unspecified   Activity Tolerance Patient tolerated treatment well   Patient Left in bed;with call bell/phone within reach;with bed alarm set;Other (comment) (sheriff/police officer present, re applied handcuff to foot of bed,  pt. with B ankle chain/handcuffs in place)   Nurse Communication Mobility status        Time: 1610-9604 OT Time Calculation (min): 29 min  Charges: OT General Charges $OT Visit: 1 Visit OT Treatments $Self Care/Home Management : 8-22 mins  Howell Macintosh, COTA/L Acute Rehabilitation [phone]   Leory Rands [place]-COTA/L  08/23/2023, 2:46 PM

## 2023-08-23 NOTE — Progress Notes (Signed)
 Spoke with [place] from telemetry several times this shift. She stated that patient had a 2.38 second pause, and a  2.85 second pause. Heart rate also dropped to mid 30s. MD was paged and made aware. An EKG was ordered and completed at bedside. MD was at bedside to see patient. Pt was asymptomatic while speaking with MD. MD has ordered follow up labs and consulted with Cardiology. Pt then had a 4.48 sec asystole. MD paged and made aware. No new orders placed. Will continue to monitor.

## 2023-08-23 NOTE — Progress Notes (Signed)
 Progress Note    Matthew Armstrong.   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 He has been hospitalized within multiple health systems including [facility], [facility], [facility], [facility].   There is a very detailed neurology note from 04/26/2023 that should be reviewed for any future hospitalizations to aid with understanding patient's extensive previous workups.  For this hospitalization, patient presented with left-sided weakness/numbness along with "left facial droop", and "dysarthria".  He was admitted for stroke workup. Although he reports not being given Eliquis  in jail, he was discontinued from Eliquis  after 04/26/2023 neurology evaluation.  There were no obvious active prescriptions for Eliquis  noted on his med rec on admission.  He had only been incarcerated at the [place] [facility] since 08/17/2023.  Although patient reports prior history of stroke, no imaging workup has shown evidence of stroke thus far.  He again underwent MRI brain on admission which was again negative for acute abnormalities nor any evidence of CVA. CT angio head/neck also negative for LVO nor any high-grade stenosis in the head or neck. CT head also unremarkable. Lab workup was also rather unremarkable.  Despite negative workup, upon evaluation with physical and occupational Therapy, he exhibited dense left-sided deficits. His physical exam was unable to be replicated consistently raising further concern for malingering and functional neurologic symptoms.   Hoover sign was positive on exam as well. With ongoing conversation, his left facial droop and dysarthria almost fully resolved especially with distraction.  Due to this, he  is not felt to be safe for discharging without further resources of some sort of rehab given the ongoing left-sided weakness.  He was recommended for equipment such as rolling walker, bedside commode, wheelchair.  Case will be discussed with jail for further disposition planning.  Interval History:  Some bradycardia with sinus pauses overnight.  Patient asymptomatic and sleeping. Otherwise neurologically looks about the same today.  Assessment and Plan: * Functional neurological symptom disorder with mixed symptoms - of note, please see 04/26/23 neurology note as fully summarizes multiple episodes of similar as well as prior workups done and multiple institutions  - main complaints/symptoms include: LUE and LLE weakness/paralysis, left sided paresthesias, left facial droop, and dysarthria - Patient has had extensive prior workups for strokes and again on admission; MRI brain again negative for CVA; still no documented stroke from prior imaging studies -He has been taken off of Eliquis  (there has been no confirmed afib in his workups in the past) in February and recommended to continue only on aspirin  (confirmed [place] jail has been giving asa with no issues and tolerating well) -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 and having just been recently incarcerated on 08/17/2023 and now presenting back to the hospital with neurodeficits consistent with prior hospitalizations at other institutions -Other considered differentials would also include conversion disorder vs complicated migraine -Appreciate neurology evaluations as well as PT, OT, speech -His dysarthria and left facial droop almost fully resolved when he is distracted and carrying on full conversation; Hoover sign also positive on exam with hip testing -Unfortunately, still not able to adequately ambulate with PT/OT; for now, we will continue hospitalization until able to speak  with jail liaison on Monday to help assist with having him transferred to [facility] for ongoing rehab; prolonging hospitalization will only encourage ongoing behavior (however volitional it is), so goal is for discharging back to jail as soon as possible  Sinus bradycardia - HR down to the 30s at night with some associated sinus pause (longest ~ 4.4 sec) - Not on any nodal blocking agents - Discussed with cardiology as well - Will recommend for outpatient sleep study upon eventual discharge and/or release from jail -Continue on telemetry during hospitalization monitoring for any daytime pauses  HLD (hyperlipidemia) - Continue Lipitor  - LDL 115  HTN (hypertension) - Normotensive without any medication at this time  Chest pain-resolved as of 08/22/2023 - No further complaints of chest pain when seen this morning - EKG negative for signs of ischemia -Troponin trend negative x 2   Old records reviewed in assessment of this patient  Antimicrobials:   DVT prophylaxis:    Code Status:   Code Status: Full Code  Mobility Assessment (Last 72 Hours)     Mobility Assessment     Row Name 08/23/23 1017 08/23/23 0800 08/23/23 0400 08/23/23 0000 08/22/23 2000   Does patient have an order for bedrest or is patient medically unstable No - Continue assessment -- No - Continue assessment No - Continue assessment No - Continue assessment   What is the highest level of mobility based on the progressive mobility assessment? Level 3 (Stands with assist) - Balance while standing  and cannot march in place Level 3 (Stands with assist) - Balance while standing  and cannot march in place Level 3 (Stands with assist) - Balance while standing  and cannot march in place Level 3 (Stands with assist) - Balance while standing  and cannot march in place Level 3 (Stands with assist) - Balance while standing  and cannot march in place   Is the above level different from baseline mobility prior to current  illness? -- -- Yes - Recommend PT order Yes - Recommend PT order Yes - Recommend PT order    Row Name 08/22/23 1354 08/22/23 1300 08/22/23 1050 08/22/23 1002 08/21/23 2246   Does patient have an order for bedrest or is patient medically unstable -- No - Continue assessment -- No - Continue assessment No - Continue assessment   What is the highest level of mobility based on the progressive mobility assessment? Level 3 (Stands with assist) - Balance while standing  and cannot march in place Level 3 (Stands with assist) - Balance while standing  and cannot march in place Level 3 (Stands with assist) - Balance while standing  and cannot march in place Level 3 (Stands with assist) - Balance while standing  and cannot march in place Level 3 (Stands with assist) - Balance while standing  and cannot march in place   Is the above level different from baseline mobility prior to current illness? -- -- -- -- Yes - Recommend PT order            Barriers to discharge: awaiting jail liaison on Monday to discuss tx to [facility]  Disposition Plan:  TBD Status is: Inpt  Objective: Blood pressure 106/67, pulse (!) 53, temperature 98.5 F (36.9 C), temperature source Oral, resp. rate 15, height 5\' 10"  (1.778 m), weight 79.4 kg, SpO2 99%.  Examination:  Physical Exam Constitutional:      General: He is not in acute distress.    Appearance: Normal appearance.  HENT:  Head: Normocephalic and atraumatic.     Mouth/Throat:     Mouth: Mucous membranes are moist.  Eyes:     Extraocular Movements: Extraocular movements intact.  Cardiovascular:     Rate and Rhythm: Regular rhythm. Bradycardia present.  Pulmonary:     Effort: Pulmonary effort is normal. No respiratory distress.     Breath sounds: Normal breath sounds. No wheezing.  Abdominal:     General: Bowel sounds are normal. There is no distension.     Palpations: Abdomen is soft.     Tenderness: There is no abdominal tenderness.  Musculoskeletal:         General: Normal range of motion.     Cervical back: Normal range of motion and neck supple.  Skin:    General: Skin is warm and dry.  Neurological:     Mental Status: He is alert.     Comments: Left facial droop and dysarthria that improves with distraction and ongoing conversation.  Flaccid weakness involving left upper and lower extremity.  Hoover sign positive with hip testing.  Reported paresthesia involving left upper and lower extremity  Psychiatric:        Mood and Affect: Mood normal.        Behavior: Behavior normal.      Consultants:  Neurology  Procedures:    Data Reviewed: Results for orders placed or performed during the hospital encounter of 08/21/23 (from the past 24 hours)  Glucose, capillary     Status: Abnormal   Collection Time: 08/23/23  2:17 AM  Result Value Ref Range   Glucose-Capillary 101 (H) 70 - 99 mg/dL  TSH     Status: None   Collection Time: 08/23/23  7:49 AM  Result Value Ref Range   TSH 2.038 0.350 - 4.500 uIU/mL    I have reviewed pertinent nursing notes, vitals, labs, and images as necessary. I have ordered labwork to follow up on as indicated.  I have reviewed the last notes from staff over past 24 hours. I have discussed patient's care plan and test results with nursing staff, CM/SW, and other staff as appropriate.  Time spent: Greater than 50% of the 55 minute visit was spent in counseling/coordination of care for the patient as laid out in the A&P.   LOS: 0 days   Faith Homes, MD [facility] 08/23/2023, 2:08 PM

## 2023-08-23 NOTE — Assessment & Plan Note (Signed)
-   HR down to the 30s at night with some associated sinus pause (longest ~ 4.4 sec) - Not on any nodal blocking agents - Discussed with cardiology as well - Will recommend for outpatient sleep study upon eventual discharge and/or release from jail -Continue on telemetry during hospitalization monitoring for any daytime pauses

## 2023-08-23 NOTE — Significant Event (Signed)
 Patient's nurse notified me that patient was getting bradycardic.  Has been having pauses with up to 3 seconds.  On exam at bedside patient is asymptomatic denies any chest pain or shortness of breath I reviewed patient's medications labs and notes.  Patient is not on any rate limiting medications.  Blood pressure is 106/70 pulse is 50/min sinus bradycardia.  Respiration 18/min.  O2 sat is 98% on room air.  I discussed with on-call cardiologist Dr. Sanjuana Crutch who reviewed the telemetry and advised that patient is having sinus pauses no interventions required.  I ordered a TSH level.  Matthew Armstrong.

## 2023-08-23 NOTE — Progress Notes (Signed)
 Physical Therapy Treatment Patient Details Name: Matthew Armstrong. MRN: [id number] DOB: Jun 07, 1980 Today's Date: 08/23/2023   History of Present Illness 43 y.o. male presents to [facility] hospital on 08/21/23 for L sided weakness. MRI and CT negative. Last admitted on 2/7 with L weakness and aphasia, had TNK and CT negative. PMH includes afib, HTN, anxiety, SCI with neurogenic bladder, CVA, PTSD, chronic pain.    PT Comments  The pt presents agreeable to session, reports no change in function of L extremities other than change from painful yesterday to no pain today. The pt continues to present with inconsistencies in volitional vs functional use of L extremities, mainly lack of buckling in L knee or L elbow when wt bearing despite pt inability to activate muscles when cued. The pt was able to complete bed mobility at supervision level, does use RUE to assist with movement of LLE and LUE to complete. Then pt was able to complete multiple sit-stands from EOB and chair with progressively decreased assistance (from modA to CGA). He benefits from cues for LUE and L foot positioning (tends to rest with L foot in inversion and will stand up on lateral aspect of foot unless cued to adjust positioning first), and CGA for balance due to increased sway and shakiness with standing. Pt tolerated all sit-stand, standing, and gait without buckling of L knee, and was progressively able to advance LLE without consistent toe drag despite reports that he cannot move ankle. The pt maintained L ankle in inversion with gait, but without rolling of ankle, and was able to DF ankle with step at times, may benefit from use of shoes to provide increased support. Pt making slow but steady progress towards goals, will continue to benefit from skilled PT acutely to progress independence and safety with mobility, remains at high risk for falls.    If plan is discharge home, recommend the following: Two people to help with walking and/or  transfers;A lot of help with bathing/dressing/bathroom;Assistance with cooking/housework;Direct supervision/assist for medications management;Direct supervision/assist for financial management;Assist for transportation;Help with stairs or ramp for entrance   Can travel by private vehicle        Equipment Recommendations  Rolling walker (2 wheels);BSC/3in1;Wheelchair (measurements PT);Wheelchair cushion (measurements PT)    Recommendations for Other Services       Precautions / Restrictions Precautions Precautions: Fall Recall of Precautions/Restrictions: Intact Restrictions Weight Bearing Restrictions Per Provider Order: No     Mobility  Bed Mobility Overal bed mobility: Needs Assistance Bed Mobility: Supine to Sit, Sit to Supine     Supine to sit: HOB elevated, Used rails, Supervision Sit to supine: Supervision   General bed mobility comments: pt using RUE on rail and RUE to assist with movement of L extremities, no assist given    Transfers Overall transfer level: Needs assistance Equipment used: Rolling walker (2 wheels) Transfers: Sit to/from Stand, Bed to chair/wheelchair/BSC Sit to Stand: Mod assist, Contact guard assist   Step pivot transfers: Mod assist, Contact guard assist       General transfer comment: Pt tends to rest with his L ankle inverted and L UE flexed in his lap. When cued to flatten his L foot he would move it and place it using his R UE. Pt stood from EOB 5x and chair x2. ModA  initially needed to power up, shift weight anteriorly, gain balance, and extend hips as he tends to lean back and flex at his hips/trunk. Pt shakes throughout his body when standing, but  it varies in intensity and he is able to maintain a conversation throughout, seems more effortful muscle activation shaking. Once pt positioned LUE on RW, no issues with maintaining grip. through session, assist gradually decreased to CGA and pt was still able to complete transfers (still had  increased sway and shaking, but no LOB or buckling). no buckling in L knee or L elbow with wt bearing    Ambulation/Gait Ambulation/Gait assistance: Mod assist, +2 safety/equipment, Contact guard assist (chair follow) Gait Distance (Feet): 6 Feet (+ 10 ft) Assistive device: Rolling walker (2 wheels) Gait Pattern/deviations: Step-to pattern, Decreased step length - left, Decreased stride length, Trunk flexed, Decreased dorsiflexion - left, Narrow base of support Gait velocity: reduced     General Gait Details: pt with trunk flexion, cues for upright posture and head position. on initial bout of walking, modA of 2 given for balance, and intermittent assist to LLE and L ankle for improved foot position for wt bearing. inconsistent toe drag, pt maintaines toes curled and wt bearing on lateral aspect of foot, at times with appropriate DF with step, no buckling with stance. on return to bed (10 ft), assist lessened to CGA with verbal cues for LLE advancement and pt able to complete without buckling or ankle injury. maintains good elbow extension in LUE and grip to maintain hold on RW.    Modified Rankin (Stroke Patients Only) Modified Rankin (Stroke Patients Only) Pre-Morbid Rankin Score: No significant disability Modified Rankin: Moderately severe disability     Balance Overall balance assessment: Needs assistance Sitting-balance support: No upper extremity supported, Feet supported Sitting balance-Leahy Scale: Good Sitting balance - Comments: static sitting EOB with supervision for safety   Standing balance support: Bilateral upper extremity supported, During functional activity, Reliant on assistive device for balance Standing balance-Leahy Scale: Poor Standing balance comment: dependent on BUE support and external assist                            Communication Communication Communication: No apparent difficulties (pt without speech clarity issues this morning, mild lisp)   Cognition Arousal: Alert Behavior During Therapy: WFL for tasks assessed/performed   PT - Cognitive impairments: Sequencing, Awareness, Safety/Judgement, Problem solving                       PT - Cognition Comments: Needs cues to sequence mobility and for awareness of unsafe L foot placement and need to correct it prior to standing. pt able to initiate after multiple reps Following commands: Intact      Cueing Cueing Techniques: Verbal cues, Tactile cues, Visual cues  Exercises General Exercises - Lower Extremity Quad Sets: AAROM, Left, 5 reps, Other (comment) (pt did not demo when first cued, but when placed with bolster under knee and leg lifted by PT, pt able to maintain without leg dropping x5) Toe Raises: AAROM, 5 reps, Other (comment) (pt with no initiation when cued, when ankle moved by PT, pt able to control descent against gravity)    General Comments General comments (skin integrity, edema, etc.): HR to 88bpm, pt cued for PLB due to shakiness and SOB. sherrif present through session.      Pertinent Vitals/Pain Pain Assessment Pain Assessment: No/denies pain Faces Pain Scale: No hurt Pain Intervention(s): Limited activity within patient's tolerance, Monitored during session     PT Goals (current goals can now be found in the care plan section) Acute Rehab PT Goals Patient  Stated Goal: to get better PT Goal Formulation: With patient Time For Goal Achievement: 09/05/23 Potential to Achieve Goals: Good Progress towards PT goals: Progressing toward goals    Frequency    Min 2X/week       AM-PAC PT "6 Clicks" Mobility   Outcome Measure  Help needed turning from your back to your side while in a flat bed without using bedrails?: A Little Help needed moving from lying on your back to sitting on the side of a flat bed without using bedrails?: A Little Help needed moving to and from a bed to a chair (including a wheelchair)?: A Lot Help needed standing up  from a chair using your arms (e.g., wheelchair or bedside chair)?: A Little Help needed to walk in hospital room?: A Lot Help needed climbing 3-5 steps with a railing? : Total 6 Click Score: 14    End of Session Equipment Utilized During Treatment: Gait belt Activity Tolerance: Patient tolerated treatment well Patient left: with call bell/phone within reach;Other (comment);in bed;with bed alarm set (sherrif in room with pt, ankle shackles to bed, bed in chair position) Nurse Communication: Mobility status PT Visit Diagnosis: Unsteadiness on feet (R26.81);Other abnormalities of gait and mobility (R26.89);Muscle weakness (generalized) (M62.81);Difficulty in walking, not elsewhere classified (R26.2);Other symptoms and signs involving the nervous system (R29.898);Hemiplegia and hemiparesis Hemiplegia - Right/Left: Left Hemiplegia - dominant/non-dominant: Non-dominant Hemiplegia - caused by: Unspecified     Time: 1610-9604 PT Time Calculation (min) (ACUTE ONLY): 29 min  Charges:    $Gait Training: 8-22 mins PT General Charges $$ ACUTE PT VISIT: 1 Visit                     Barnabas Booth, PT, DPT   Acute Rehabilitation Department Office [phone] Secure Chat Communication Preferred   Lona Rist 08/23/2023, 9:16 AM

## 2023-08-24 DIAGNOSIS — R001 Bradycardia, unspecified: Secondary | ICD-10-CM | POA: Diagnosis not present

## 2023-08-24 DIAGNOSIS — F447 Conversion disorder with mixed symptom presentation: Secondary | ICD-10-CM | POA: Diagnosis not present

## 2023-08-24 LAB — HEMOGLOBIN A1C
Hgb A1c MFr Bld: 5 % (ref 4.8–5.6)
Mean Plasma Glucose: 97 mg/dL

## 2023-08-24 NOTE — Progress Notes (Signed)
 Physical Therapy Treatment Patient Details Name: Matthew Armstrong. MRN: [id number] DOB: 10/17/1980 Today's Date: 08/24/2023   History of Present Illness 43 y.o. male presents to [facility] on 08/21/23 for L sided weakness. MRI and CT negative. Last admitted on 2/7 with L weakness and aphasia, had TNK and CT negative. PMH includes afib, HTN, anxiety, SCI with neurogenic bladder, CVA, PTSD, chronic pain.    PT Comments  Pt with poor tolerance to treatment today. Initially trialed 2 person HHA due to pt stating that he could not grip with LUE however pt required +2 Max A with noted extreme L knee hyperextension and knee buckling. Opted for L platform walker which pt was able to progress to +2 Mod A with very close chair follow for both. Pt continued with very ataxic gait pattern. DC recs updated. Patient will benefit from continued inpatient follow up therapy, <3 hours/day at facility. PT will continue to follow.      If plan is discharge home, recommend the following: Two people to help with walking and/or transfers;A lot of help with bathing/dressing/bathroom;Assistance with cooking/housework;Direct supervision/assist for medications management;Direct supervision/assist for financial management;Assist for transportation;Help with stairs or ramp for entrance   Can travel by private vehicle        Equipment Recommendations  Rolling walker (2 wheels);BSC/3in1;Wheelchair (measurements PT);Wheelchair cushion (measurements PT)    Recommendations for Other Services       Precautions / Restrictions Precautions Precautions: Fall Recall of Precautions/Restrictions: Intact Restrictions Weight Bearing Restrictions Per Provider Order: No     Mobility  Bed Mobility Overal bed mobility: Needs Assistance Bed Mobility: Supine to Sit, Sit to Supine     Supine to sit: HOB elevated, Used rails, Supervision Sit to supine: Supervision   General bed mobility comments: pt using RUE on rail and RUE to  assist with movement of L extremities, no assist given    Transfers Overall transfer level: Needs assistance Equipment used: 2 person hand held assist, Left platform walker Transfers: Sit to/from Stand, Bed to chair/wheelchair/BSC Sit to Stand: +2 physical assistance, Mod assist   Step pivot transfers: +2 physical assistance, Mod assist       General transfer comment: Pt tends to rest with his L ankle inverted and L UE flexed in his lap. When cued to flatten his L foot he would move it and place it using his R UE. ModA  initially needed to power up, shift weight anteriorly, gain balance, and extend hips as he tends to lean back and flex at his hips/trunk. Pt shakes throughout his body when standing, but it varies in intensity and he is able to maintain a conversation throughout, seems more effortful muscle activation shaking. Once pt positioned LUE on RW, no issues with maintaining grip. through session, assist gradually decreased to CGA and pt was still able to complete transfers (still had increased sway and shaking, but no LOB or buckling). no buckling in L knee or L elbow with wt bearing. (Similar to previous session)    Ambulation/Gait Ambulation/Gait assistance: +2 safety/equipment, +2 physical assistance, Max assist, Mod assist Gait Distance (Feet): 12 Feet (6+6) Assistive device: 2 person hand held assist, Left platform walker Gait Pattern/deviations: Step-to pattern, Decreased step length - left, Decreased stride length, Trunk flexed, Decreased dorsiflexion - left, Narrow base of support, Ataxic, Knee hyperextension - left, Knees buckling Gait velocity: reduced     General Gait Details: Initially trialed 2 person HHA due to pt stating that he could not grip with  LUE however pt required +2 Max A with noted extreme L knee hyperextension and knee buckling. Opted for L platform walker which pt was able to progress to +2 Mod A with very close chair follow for both. Pt continued with very  ataxic gait pattern.   Stairs             Wheelchair Mobility     Tilt Bed    Modified Rankin (Stroke Patients Only) Modified Rankin (Stroke Patients Only) Pre-Morbid Rankin Score: No significant disability Modified Rankin: Moderately severe disability     Balance Overall balance assessment: Needs assistance Sitting-balance support: No upper extremity supported, Feet supported Sitting balance-Leahy Scale: Good Sitting balance - Comments: static sitting EOB with supervision for safety   Standing balance support: Bilateral upper extremity supported, During functional activity, Reliant on assistive device for balance Standing balance-Leahy Scale: Poor Standing balance comment: dependent on BUE support and external assist                            Communication Communication Communication: No apparent difficulties  Cognition Arousal: Alert Behavior During Therapy: WFL for tasks assessed/performed   PT - Cognitive impairments: Sequencing, Awareness, Safety/Judgement, Problem solving                       PT - Cognition Comments: Needs cues to sequence mobility and for awareness of unsafe L foot placement and need to correct it prior to standing. pt able to initiate after multiple reps Following commands: Intact      Cueing Cueing Techniques: Verbal cues, Tactile cues, Visual cues  Exercises      General Comments General comments (skin integrity, edema, etc.): VSS      Pertinent Vitals/Pain Pain Assessment Pain Assessment: Faces Faces Pain Scale: Hurts a little bit Pain Location: LLE Pain Descriptors / Indicators: Discomfort, Grimacing Pain Intervention(s): Monitored during session, Limited activity within patient's tolerance    Home Living                          Prior Function            PT Goals (current goals can now be found in the care plan section) Acute Rehab PT Goals Patient Stated Goal: to get better Progress  towards PT goals: Progressing toward goals    Frequency    Min 2X/week      PT Plan      Co-evaluation              AM-PAC PT "6 Clicks" Mobility   Outcome Measure  Help needed turning from your back to your side while in a flat bed without using bedrails?: A Little Help needed moving from lying on your back to sitting on the side of a flat bed without using bedrails?: A Little Help needed moving to and from a bed to a chair (including a wheelchair)?: A Lot Help needed standing up from a chair using your arms (e.g., wheelchair or bedside chair)?: A Little Help needed to walk in hospital room?: A Lot Help needed climbing 3-5 steps with a railing? : Total 6 Click Score: 14    End of Session Equipment Utilized During Treatment: Gait belt Activity Tolerance: Patient tolerated treatment well Patient left: with call bell/phone within reach;Other (comment);in bed;with bed alarm set (Sherriff in room, ankles shackled to bed.) Nurse Communication: Mobility status PT Visit Diagnosis: Unsteadiness on  feet (R26.81);Other abnormalities of gait and mobility (R26.89);Muscle weakness (generalized) (M62.81);Difficulty in walking, not elsewhere classified (R26.2);Other symptoms and signs involving the nervous system (R29.898);Hemiplegia and hemiparesis Hemiplegia - Right/Left: Left Hemiplegia - dominant/non-dominant: Non-dominant Hemiplegia - caused by: Unspecified     Time: 1610-9604 PT Time Calculation (min) (ACUTE ONLY): 19 min  Charges:    $Gait Training: 8-22 mins PT General Charges $$ ACUTE PT VISIT: 1 Visit                     Rodgers Clack, PT, DPT Acute Rehab Services [phone]    Ellamae Lybeck 08/24/2023, 3:18 PM

## 2023-08-24 NOTE — Plan of Care (Signed)
 Sitting up in bed watching TV with correction officer at his side.  No signs of distress.  No complaints, except occasional headache.    Problem: Education: Goal: Knowledge of disease or condition will improve Outcome: Progressing Goal: Knowledge of secondary prevention will improve (MUST DOCUMENT ALL) Outcome: Progressing Goal: Knowledge of patient specific risk factors will improve (DELETE if not current risk factor) Outcome: Progressing   Problem: Ischemic Stroke/TIA Tissue Perfusion: Goal: Complications of ischemic stroke/TIA will be minimized Outcome: Progressing   Problem: Coping: Goal: Will verbalize positive feelings about self Outcome: Progressing

## 2023-08-24 NOTE — Progress Notes (Signed)
 Progress Note    Matthew Armstrong.   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 He has been hospitalized within multiple health systems including [facility], [facility], [facility], [place].   There is a very detailed neurology note from 04/26/2023 that should be reviewed for any future hospitalizations to aid with understanding patient's extensive previous workups.  For this hospitalization, patient presented with left-sided weakness/numbness along with "left facial droop", and "dysarthria".  He was admitted for stroke workup. Although he reports not being given Eliquis  in jail, he was discontinued from Eliquis  after 04/26/2023 neurology evaluation.  There were no obvious active prescriptions for Eliquis  noted on his med rec on admission.  He had only been incarcerated at the [place] jail since 08/17/2023.  Although patient reports prior history of stroke, no imaging workup has shown evidence of stroke thus far.  He again underwent MRI brain on admission which was again negative for acute abnormalities nor any evidence of CVA. CT angio head/neck also negative for LVO nor any high-grade stenosis in the head or neck. CT head also unremarkable. Lab workup was also rather unremarkable.  Despite negative workup, upon evaluation with physical and occupational Therapy, he exhibited dense left-sided deficits. His physical exam was unable to be replicated consistently raising further concern for malingering and functional neurologic symptoms.   Hoover sign was positive on exam as well. With ongoing conversation, his left facial droop and dysarthria almost fully resolved especially with distraction.  Due to this, he  is not felt to be safe for discharging without further resources of some sort of rehab given the ongoing left-sided weakness.  He was recommended for equipment such as rolling walker, bedside commode, wheelchair.  Case will be discussed with jail for further disposition planning.  Interval History:  Feels about the same this morning.  Dysarthria and left facial droop are improved.  Still with dense left-sided weakness. Discussing further with DOC today regarding disposition planning.  Assessment and Plan: * Functional neurological symptom disorder with mixed symptoms - of note, please see 04/26/23 neurology note as fully summarizes multiple episodes of similar as well as prior workups done and multiple institutions  - main complaints/symptoms include: LUE and LLE weakness/paralysis, left sided paresthesias, left facial droop, and dysarthria - FND may be precipitated from stress of incarceration on 08/17/23 - Patient has had extensive prior workups for strokes and again on admission; MRI brain again negative for CVA; still no documented stroke from prior imaging studies -He has been taken off of Eliquis  (there has been no confirmed afib in his workups in the past) in February and recommended to continue only on aspirin  (confirmed [place] jail has been giving asa with no issues and tolerating well) -some probable contribution from underlying malingering but left paralysis seems consistent with FND also; he will ultimately benefit from some counseling/referral to psychiatry as well -Other considered differentials would also include conversion disorder vs complicated migraine -Appreciate neurology evaluations as well as PT, OT, speech -His dysarthria and left facial droop almost fully resolve when he is distracted and carrying on full conversation; Hoover sign also positive on exam with hip testing -Unfortunately, still not able to adequately ambulate with PT/OT; for now, we  will continue hospitalization  until able to place in in rehab  Sinus bradycardia - HR down to the 30s at night with some associated sinus pause (longest ~ 4.4 sec) - Not on any nodal blocking agents - Discussed with cardiology as well - Will recommend for outpatient sleep study upon eventual discharge and/or release from jail -Continue on telemetry during hospitalization monitoring for any daytime pauses  HLD (hyperlipidemia) - Continue Lipitor  - LDL 115  HTN (hypertension) - Normotensive without any medication at this time  Chest pain-resolved as of 08/22/2023 - No further complaints of chest pain when seen this morning - EKG negative for signs of ischemia -Troponin trend negative x 2   Old records reviewed in assessment of this patient  Antimicrobials:   DVT prophylaxis:    Code Status:   Code Status: Full Code  Mobility Assessment (Last 72 Hours)     Mobility Assessment     Row Name 08/24/23 0400 08/24/23 0000 08/23/23 2000 08/23/23 1017 08/23/23 0800   Does patient have an order for bedrest or is patient medically unstable No - Continue assessment No - Continue assessment No - Continue assessment No - Continue assessment --   What is the highest level of mobility based on the progressive mobility assessment? Level 3 (Stands with assist) - Balance while standing  and cannot march in place Level 3 (Stands with assist) - Balance while standing  and cannot march in place Level 3 (Stands with assist) - Balance while standing  and cannot march in place Level 3 (Stands with assist) - Balance while standing  and cannot march in place Level 3 (Stands with assist) - Balance while standing  and cannot march in place   Is the above level different from baseline mobility prior to current illness? Yes - Recommend PT order Yes - Recommend PT order Yes - Recommend PT order -- --    Row Name 08/23/23 0400 08/23/23 0000 08/22/23 2000 08/22/23 1354 08/22/23 1300   Does patient have an order for bedrest or is patient  medically unstable No - Continue assessment No - Continue assessment No - Continue assessment -- No - Continue assessment   What is the highest level of mobility based on the progressive mobility assessment? Level 3 (Stands with assist) - Balance while standing  and cannot march in place Level 3 (Stands with assist) - Balance while standing  and cannot march in place Level 3 (Stands with assist) - Balance while standing  and cannot march in place Level 3 (Stands with assist) - Balance while standing  and cannot march in place Level 3 (Stands with assist) - Balance while standing  and cannot march in place   Is the above level different from baseline mobility prior to current illness? Yes - Recommend PT order Yes - Recommend PT order Yes - Recommend PT order -- --    Row Name 08/22/23 1050 08/22/23 1002 08/21/23 2246       Does patient have an order for bedrest or is patient medically unstable -- No - Continue assessment No - Continue assessment     What is the highest level of mobility based on the progressive mobility assessment? Level 3 (Stands with assist) - Balance while standing  and cannot march in place Level 3 (Stands with assist) - Balance while standing  and cannot march in place Level 3 (Stands with assist) - Balance while standing  and cannot march in place     Is the above level  different from baseline mobility prior to current illness? -- -- Yes - Recommend PT order              Barriers to discharge: awaiting jail liaison on Monday to discuss possible tx to central  Disposition Plan:  TBD Status is: Inpt  Objective: Blood pressure 113/65, pulse 63, temperature 98.1 F (36.7 C), temperature source Oral, resp. rate 19, height 5\' 10"  (1.778 m), weight 79.4 kg, SpO2 97%.  Examination:  Physical Exam Constitutional:      General: He is not in acute distress.    Appearance: Normal appearance.  HENT:     Head: Normocephalic and atraumatic.     Mouth/Throat:     Mouth: Mucous  membranes are moist.  Eyes:     Extraocular Movements: Extraocular movements intact.  Cardiovascular:     Rate and Rhythm: Regular rhythm. Bradycardia present.  Pulmonary:     Effort: Pulmonary effort is normal. No respiratory distress.     Breath sounds: Normal breath sounds. No wheezing.  Abdominal:     General: Bowel sounds are normal. There is no distension.     Palpations: Abdomen is soft.     Tenderness: There is no abdominal tenderness.  Musculoskeletal:        General: Normal range of motion.     Cervical back: Normal range of motion and neck supple.  Skin:    General: Skin is warm and dry.  Neurological:     Mental Status: He is alert.     Comments: Left facial droop and dysarthria that improves with distraction and ongoing conversation.  Flaccid weakness involving left upper and lower extremity.  Hoover sign positive with hip testing.  Reported paresthesia involving left upper and lower extremity  Psychiatric:        Mood and Affect: Mood normal.        Behavior: Behavior normal.      Consultants:  Neurology  Procedures:    Data Reviewed: No results found for this or any previous visit (from the past 24 hours).   I have reviewed pertinent nursing notes, vitals, labs, and images as necessary. I have ordered labwork to follow up on as indicated.  I have reviewed the last notes from staff over past 24 hours. I have discussed patient's care plan and test results with nursing staff, CM/SW, and other staff as appropriate.  Time spent: Greater than 50% of the 55 minute visit was spent in counseling/coordination of care for the patient as laid out in the A&P.   LOS: 1 day   Faith Homes, MD [facility] 08/24/2023, 1:35 PM

## 2023-08-25 DIAGNOSIS — R001 Bradycardia, unspecified: Secondary | ICD-10-CM | POA: Diagnosis not present

## 2023-08-25 DIAGNOSIS — F447 Conversion disorder with mixed symptom presentation: Secondary | ICD-10-CM | POA: Diagnosis not present

## 2023-08-25 NOTE — Plan of Care (Signed)
 Discharging.  Had a shower.  Guard at his side.  All questions answered.  No signs of distress.    Problem: Education: Goal: Knowledge of disease or condition will improve Outcome: Adequate for Discharge Goal: Knowledge of secondary prevention will improve (MUST DOCUMENT ALL) Outcome: Adequate for Discharge Goal: Knowledge of patient specific risk factors will improve (DELETE if not current risk factor) Outcome: Adequate for Discharge   Problem: Ischemic Stroke/TIA Tissue Perfusion: Goal: Complications of ischemic stroke/TIA will be minimized Outcome: Adequate for Discharge   Problem: Coping: Goal: Will verbalize positive feelings about self Outcome: Adequate for Discharge Goal: Will identify appropriate support needs Outcome: Adequate for Discharge   Problem: Health Behavior/Discharge Planning: Goal: Ability to manage health-related needs will improve Outcome: Adequate for Discharge Goal: Goals will be collaboratively established with patient/family Outcome: Adequate for Discharge   Problem: Self-Care: Goal: Ability to participate in self-care as condition permits will improve Outcome: Adequate for Discharge Goal: Verbalization of feelings and concerns over difficulty with self-care will improve Outcome: Adequate for Discharge Goal: Ability to communicate needs accurately will improve Outcome: Adequate for Discharge   Problem: Nutrition: Goal: Risk of aspiration will decrease Outcome: Adequate for Discharge Goal: Dietary intake will improve Outcome: Adequate for Discharge   Problem: Education: Goal: Knowledge of General Education information will improve Description: Including pain rating scale, medication(s)/side effects and non-pharmacologic comfort measures Outcome: Adequate for Discharge   Problem: Health Behavior/Discharge Planning: Goal: Ability to manage health-related needs will improve Outcome: Adequate for Discharge   Problem: Clinical Measurements: Goal:  Ability to maintain clinical measurements within normal limits will improve Outcome: Adequate for Discharge Goal: Will remain free from infection Outcome: Adequate for Discharge Goal: Diagnostic test results will improve Outcome: Adequate for Discharge Goal: Respiratory complications will improve Outcome: Adequate for Discharge Goal: Cardiovascular complication will be avoided Outcome: Adequate for Discharge   Problem: Activity: Goal: Risk for activity intolerance will decrease Outcome: Adequate for Discharge   Problem: Nutrition: Goal: Adequate nutrition will be maintained Outcome: Adequate for Discharge   Problem: Coping: Goal: Level of anxiety will decrease Outcome: Adequate for Discharge   Problem: Elimination: Goal: Will not experience complications related to bowel motility Outcome: Adequate for Discharge Goal: Will not experience complications related to urinary retention Outcome: Adequate for Discharge   Problem: Pain Managment: Goal: General experience of comfort will improve and/or be controlled Outcome: Adequate for Discharge   Problem: Safety: Goal: Ability to remain free from injury will improve Outcome: Adequate for Discharge   Problem: Skin Integrity: Goal: Risk for impaired skin integrity will decrease Outcome: Adequate for Discharge

## 2023-08-25 NOTE — Plan of Care (Signed)

## 2023-08-25 NOTE — Discharge Summary (Signed)
 Physician Discharge Summary   Matthew Armstrong. WUJ:[id number] DOB: 1980-07-07 DOA: 08/21/2023  PCP: Pcp, No  Admit date: 08/21/2023 Discharge date: 08/25/2023  Ad[facility] From: [facility]  [facility] [place]   Discharging physician: Faith Homes, MD Barriers to discharge: none  Recommendations at discharge: Continue ongoing PT/OT/SLP Needs referral to psychiatry outpatient Needs follow up with primary care to arrange for sleep study due to nocturnal sinus pauses (asymptomatic)   Discharge Condition: stable CODE STATUS: Full  Diet recommendation:  Diet Orders (From admission, onward)     Start     Ordered   08/25/23 0000  Diet general        08/25/23 1005   08/21/23 2311  Diet Heart Room service appropriate? Yes; Fluid consistency: Thin  Diet effective now       Question Answer Comment  Room service appropriate? 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 He has been hospitalized within multiple health systems including [facility], [facility], [facility], .   There is a very detailed neurology note from 04/26/2023 that should be reviewed for any future hospitalizations to aid with understanding patient's extensive previous workups.  For this hospitalization, patient presented with left-sided weakness/numbness along with "left facial droop", and "dysarthria".  He was admitted for stroke workup. Although he reports not being given Eliquis  in jail, he was discontinued from Eliquis  after 04/26/2023 neurology evaluation.  There were no obvious active prescriptions for Eliquis  noted on his med rec on admission.  He had only been incarcerated at the [place] [facility] since 08/17/2023.  Although patient reports prior  history of stroke, no imaging workup has shown evidence of stroke thus far.  He again underwent MRI brain on admission which was again negative for acute abnormalities nor any evidence of CVA. CT angio head/neck also negative for LVO nor any high-grade stenosis in the head or neck. CT head also unremarkable. Lab workup was also rather unremarkable.  Despite negative workup, upon evaluation with physical and occupational Therapy, he exhibited dense left-sided deficits. His physical exam was unable to be replicated consistently raising further concern for malingering and functional neurologic symptoms.   Hoover sign was positive on exam as well. With ongoing conversation, his left facial droop and dysarthria almost fully resolved especially with distraction.  Due to this, he is not felt to be safe for discharging without further resources of some sort of rehab given the ongoing left-sided weakness.   Case will be discussed with jail for further disposition planning.  Assessment and Plan: * Functional neurological symptom disorder with mixed symptoms - of note, please see 04/26/23 neurology note as fully summarizes multiple episodes of similar as well as prior workups done and multiple institutions  - main complaints/symptoms include: LUE and LLE weakness/paralysis, left sided paresthesias, left facial droop, and dysarthria - FND may be precipitated from stress of incarceration on 08/17/23 - Patient has had extensive prior workups for strokes and again on admission; MRI brain again negative for CVA; still no documented stroke from prior imaging studies -He has been taken off of Eliquis  (there has been no confirmed afib in his workups in the past) in February and recommended to continue only on aspirin  (confirmed  [place] [facility] has been giving asa with no issues and tolerating well) -some probable contribution from underlying malingering but left paralysis seems consistent with FND also; he will  ultimately benefit from some counseling/referral to psychiatry as well -Other considered differentials would also include conversion disorder vs complicated migraine -Appreciate neurology evaluations as well as PT, OT, speech -His dysarthria and left facial droop almost fully resolve when he is distracted and carrying on full conversation; Hoover sign also positive on exam with hip testing -Unfortunately, still not able to adequately ambulate with PT/OT; for now, we will continue hospitalization until able to place in in rehab  Sinus bradycardia - HR down to the 30s at night with some associated sinus pause (longest ~ 4.4 sec) - Not on any nodal blocking agents - Discussed with cardiology as well - Will recommend for outpatient sleep study upon eventual discharge and/or release from jail -Continue on telemetry during hospitalization monitoring for any daytime pauses  HLD (hyperlipidemia) - Continue Lipitor  - LDL 115  HTN (hypertension) - Normotensive without any medication at this time  Chest pain-resolved as of 08/22/2023 - No further complaints of chest pain when seen this morning - EKG negative for signs of ischemia -Troponin trend negative x 2    Principal Diagnosis: Functional neurological symptom disorder with mixed symptoms  Discharge Diagnoses: Active Hospital Problems   Diagnosis Date Noted   Functional neurological symptom disorder with mixed symptoms 01/29/2023    Priority: 1.   Sinus bradycardia 08/23/2023    Priority: 2.   HTN (hypertension) 08/21/2023   HLD (hyperlipidemia) 08/21/2023    Resolved Hospital Problems   Diagnosis Date Noted Date Resolved   Chest pain 08/21/2023 08/22/2023     Discharge Instructions     Ambulatory referral to Occupational Therapy   Complete by: As directed    Ambulatory referral to Physical Therapy   Complete by: As directed    Diet general   Complete by: As directed    Increase activity slowly   Complete by: As directed        Allergies as of 08/25/2023       Reactions   Acetaminophen  Nausea Only   Ciprofloxacin Hives   Clindamycin/lincomycin Other (See Comments)   Unknown reaction   Ibuprofen Nausea And Vomiting   Abdominal pain, difficulty breathing   Naproxen Other (See Comments)   Not listed on MAR   Nsaids    Pt tolerates ASA   Tetracyclines & Related Diarrhea, Nausea And Vomiting   Abdominal pain        Medication List     STOP taking these medications    albuterol  108 (90 Base) MCG/ACT inhaler Commonly known as: VENTOLIN  HFA   famotidine  20 MG tablet Commonly known as: Pepcid    gabapentin  300 MG capsule Commonly known as: NEURONTIN    prazosin  2 MG capsule Commonly known as: MINIPRESS        TAKE these medications    aspirin  EC 81 MG tablet Take 81 mg by mouth every morning. Swallow whole. What changed: Another medication with the same name was removed. Continue taking this medication, and follow the directions you see here.   atorvastatin  80 MG tablet Commonly known as: Lipitor  Take 1 tablet (80 mg total) by mouth daily.   sertraline  50 MG tablet Commonly known as: ZOLOFT  Take 1 tablet (50 mg total) by mouth daily.        Follow-up Information     [facility]. Schedule an appointment as soon as  possible for a visit.   Specialty: Rehabilitation Contact information: [address] [place]  [address] [phone]               Allergies  Allergen Reactions   Acetaminophen  Nausea Only   Ciprofloxacin Hives   Clindamycin/Lincomycin Other (See Comments)    Unknown reaction   Ibuprofen Nausea And Vomiting    Abdominal pain, difficulty breathing   Naproxen Other (See Comments)    Not listed on MAR   Nsaids     Pt tolerates ASA   Tetracyclines & Related Diarrhea and Nausea And Vomiting    Abdominal pain    Consultations: Neurology  Procedures:   Discharge Exam: BP 111/66 (BP Location: Right  Arm)   Pulse (!) 48   Temp 97.8 F (36.6 C) (Oral)   Resp 15   Ht 5\' 10"  (1.778 m)   Wt 79.4 kg   SpO2 98%   BMI 25.12 kg/m  Physical Exam Constitutional:      General: He is not in acute distress.    Appearance: Normal appearance.  HENT:     Head: Normocephalic and atraumatic.     Mouth/Throat:     Mouth: Mucous membranes are moist.  Eyes:     Extraocular Movements: Extraocular movements intact.  Cardiovascular:     Rate and Rhythm: Regular rhythm. Bradycardia present.  Pulmonary:     Effort: Pulmonary effort is normal. No respiratory distress.     Breath sounds: Normal breath sounds. No wheezing.  Abdominal:     General: Bowel sounds are normal. There is no distension.     Palpations: Abdomen is soft.     Tenderness: There is no abdominal tenderness.  Musculoskeletal:        General: Normal range of motion.     Cervical back: Normal range of motion and neck supple.  Skin:    General: Skin is warm and dry.  Neurological:     Mental Status: He is alert.     Comments: Very mild left facial droop & dysarthria again most fully resolves with conversation and distraction. Still dense left sided weakness (upper/lower) but positive Hoover sign in lower extremity. Patient reports complete "numbness" in LUE/LLE  Psychiatric:        Mood and Affect: Mood normal.        Behavior: Behavior normal.      The results of significant diagnostics from this hospitalization (including imaging, microbiology, ancillary and laboratory) are listed below for reference.   Microbiology: Recent Results (from the past 240 hours)  MRSA Next Gen by PCR, Nasal     Status: Abnormal   Collection Time: 08/21/23 11:25 PM   Specimen: Nasal Mucosa; Nasal Swab  Result Value Ref Range Status   MRSA by PCR Next Gen DETECTED (A) NOT DETECTED Final    Comment: RESULT CALLED TO, READ BACK BY AND VERIFIED WITH: T BURTON,RN@0219  08/22/23 MK (NOTE) The GeneXpert MRSA Assay (FDA approved for NASAL specimens  only), is one component of a comprehensive MRSA colonization surveillance program. It is not intended to diagnose MRSA infection nor to guide or monitor treatment for MRSA infections. Test performance is not FDA approved in patients less than 54 years old. Performed at [facility], [address], [place], [place] [address]      Labs: BNP (last 3 results) No results for input(s): "BNP" in the last 8760 hours. Basic Metabolic Panel: Recent Labs  Lab 08/21/23 1814 08/21/23 1903  NA 135  135  K 5.8* 4.4  CL 102 103  CO2  --  24  GLUCOSE 192* 169*  BUN 15 12  CREATININE 0.90 0.81  CALCIUM   --  8.7*   Liver Function Tests: Recent Labs  Lab 08/21/23 1903  AST 25  ALT 15  ALKPHOS 52  BILITOT 0.9  PROT 5.4*  ALBUMIN 3.5   No results for input(s): "LIPASE", "AMYLASE" in the last 168 hours. No results for input(s): "AMMONIA" in the last 168 hours. CBC: Recent Labs  Lab 08/21/23 1814 08/21/23 1826  WBC  --  7.8  NEUTROABS  --  5.1  HGB 13.3 13.6  HCT 39.0 40.4  MCV  --  93.7  PLT  --  244   Cardiac Enzymes: No results for input(s): "CKTOTAL", "CKMB", "CKMBINDEX", "TROPONINI" in the last 168 hours. BNP: Invalid input(s): "POCBNP" CBG: Recent Labs  Lab 08/21/23 1807 08/23/23 0217  GLUCAP 208* 101*   D-Dimer No results for input(s): "DDIMER" in the last 72 hours. Hgb A1c No results for input(s): "HGBA1C" in the last 72 hours. Lipid Profile No results for input(s): "CHOL", "HDL", "LDLCALC", "TRIG", "CHOLHDL", "LDLDIRECT" in the last 72 hours. Thyroid  function studies Recent Labs    08/23/23 0749  TSH 2.038   Anemia work up No results for input(s): "VITAMINB12", "FOLATE", "FERRITIN", "TIBC", "IRON", "RETICCTPCT" in the last 72 hours. Urinalysis    Component Value Date/Time   COLORURINE YELLOW 01/28/2023 1750   APPEARANCEUR CLEAR 01/28/2023 1750   LABSPEC 1.033 (H) 01/28/2023 1750   PHURINE 6.0 01/28/2023 1750   GLUCOSEU NEGATIVE 01/28/2023  1750   HGBUR NEGATIVE 01/28/2023 1750   BILIRUBINUR NEGATIVE 01/28/2023 1750   KETONESUR NEGATIVE 01/28/2023 1750   PROTEINUR NEGATIVE 01/28/2023 1750   NITRITE NEGATIVE 01/28/2023 1750   LEUKOCYTESUR NEGATIVE 01/28/2023 1750   Sepsis Labs Recent Labs  Lab 08/21/23 1826  WBC 7.8   Microbiology Recent Results (from the past 240 hours)  MRSA Next Gen by PCR, Nasal     Status: Abnormal   Collection Time: 08/21/23 11:25 PM   Specimen: Nasal Mucosa; Nasal Swab  Result Value Ref Range Status   MRSA by PCR Next Gen DETECTED (A) NOT DETECTED Final    Comment: RESULT CALLED TO, READ BACK BY AND VERIFIED WITH: T BURTON,RN@0219  08/22/23 MK (NOTE) The GeneXpert MRSA Assay (FDA approved for NASAL specimens only), is one component of a comprehensive MRSA colonization surveillance program. It is not intended to diagnose MRSA infection nor to guide or monitor treatment for MRSA infections. Test performance is not FDA approved in patients less than 30 years old. Performed at [facility], [address] [place], [place] [address]     Procedures/Studies: MR BRAIN WO CONTRAST Result Date: 08/21/2023 CLINICAL DATA:  Acute neurologic deficit EXAM: MRI HEAD WITHOUT CONTRAST TECHNIQUE: Multiplanar, multiecho pulse sequences of the brain and surrounding structures were obtained without intravenous contrast. COMPARISON:  04/25/2023 FINDINGS: Brain: No acute infarct, mass effect or extra-axial collection. No acute or chronic hemorrhage. Normal white matter signal, parenchymal volume and CSF spaces. The midline structures are normal. Vascular: Normal flow voids. Skull and upper cervical spine: Normal calvarium and skull base. Visualized upper cervical spine and soft tissues are normal. Sinuses/Orbits:Left maxillary sinus retention cyst. Normal orbits. Trace right mastoid fluid. IMPRESSION: Normal MRI of the brain. Electronically Signed   By: Juanetta Nordmann M.D.   On: 08/21/2023 22:30   DG CHEST PORT  1 VIEW Result Date: 08/21/2023 CLINICAL DATA:  Shortness of  breath EXAM: PORTABLE CHEST 1 VIEW COMPARISON:  06/15/2023 FINDINGS: The heart size and mediastinal contours are within normal limits. Both lungs are clear. The visualized skeletal structures are unremarkable. IMPRESSION: No active disease. Electronically Signed   By: Janeece Mechanic M.D.   On: 08/21/2023 21:28   CT ANGIO HEAD NECK W WO CM W PERF (CODE STROKE) Result Date: 08/21/2023 CLINICAL DATA:  Neuro deficit, concern for stroke. EXAM: CT ANGIOGRAPHY HEAD AND NECK CT PERFUSION BRAIN TECHNIQUE: Multidetector CT imaging of the head and neck was performed using the standard protocol during bolus administration of intravenous contrast. Multiplanar CT image reconstructions and MIPs were obtained to evaluate the vascular anatomy. Carotid stenosis measurements (when applicable) are obtained utilizing NASCET criteria, using the distal internal carotid diameter as the denominator. Multiphase CT imaging of the brain was performed following IV bolus contrast injection. Subsequent parametric perfusion maps were calculated using RAPID software. RADIATION DOSE REDUCTION: This exam was performed according to the departmental dose-optimization program which includes automated exposure control, adjustment of the mA and/or kV according to patient size and/or use of iterative reconstruction technique. CONTRAST:  OMNIPAQUE  IOHEXOL  350 MG/ML SOLN COMPARISON:  Earlier same day head CT. CTA head and neck 04/24/2023. FINDINGS: CTA NECK FINDINGS Aortic arch: Standard configuration of the aortic arch. Imaged portion shows no evidence of aneurysm or dissection. No significant stenosis of the major arch vessel origins. Pulmonary arteries: As permitted by contrast timing, there are no filling defects in the visualized pulmonary arteries. Subclavian arteries: The subclavian arteries are patent bilaterally. Right carotid system: No evidence of dissection, stenosis (50% or  greater), or occlusion. Left carotid system: No evidence of dissection, stenosis (50% or greater), or occlusion. Vertebral arteries: Codominant. No evidence of dissection, stenosis (50% or greater), or occlusion. Skeleton: No acute or aggressive finding noted. Other neck: The visualized airway is patent. No cervical lymphadenopathy. Upper chest: Visualized lung apices are clear. Review of the MIP images confirms the above findings CTA HEAD FINDINGS ANTERIOR CIRCULATION: The intracranial ICAs are patent bilaterally. No significant stenosis, proximal occlusion, aneurysm, or vascular malformation. MCAs: The middle cerebral arteries are patent bilaterally. ACAs: The anterior cerebral arteries are patent bilaterally. POSTERIOR CIRCULATION: No significant stenosis, proximal occlusion, aneurysm, or vascular malformation. PCAs: Patent bilaterally.  Fetal origin of the right PCA. Pcomm: The posterior communicating arteries are visualized bilaterally. SCAs: The superior cerebellar arteries are patent bilaterally. Basilar artery: Patent AICAs: Small caliber AICA visualized on the right. PICAs: Visualized on the left. Vertebral arteries: The intracranial vertebral arteries are patent. Venous sinuses: As permitted by contrast timing, patent. Anatomic variants: None Review of the MIP images confirms the above findings CT Brain Perfusion Findings: ASPECTS: 10 CBF (<30%) Volume: 0mL Perfusion (Tmax>6.0s) volume: 0mL Mismatch Volume: 0mL Infarction Location:None identified IMPRESSION: No large vessel occlusion. No core infarct identified on CT perfusion. No high-grade stenosis, aneurysm, or dissection of the arteries in the head and neck. Electronically Signed   By: Denny Flack M.D.   On: 08/21/2023 18:57   CT HEAD CODE STROKE WO CONTRAST Result Date: 08/21/2023 CLINICAL DATA:  Code stroke.  Neuro deficit, concern for stroke. EXAM: CT HEAD WITHOUT CONTRAST TECHNIQUE: Contiguous axial images were obtained from the base of the  skull through the vertex without intravenous contrast. RADIATION DOSE REDUCTION: This exam was performed according to the departmental dose-optimization program which includes automated exposure control, adjustment of the mA and/or kV according to patient size and/or use of iterative reconstruction technique. COMPARISON:  MRI head 04/25/2023.  FINDINGS: Brain: No acute intracranial hemorrhage. No CT evidence of acute infarct. No edema, mass effect, or midline shift. The basilar cisterns are patent. Ventricles: The ventricles are normal. Vascular: No hyperdense vessel or unexpected calcification. Skull: No acute or aggressive finding. Orbits: Orbits are symmetric. Sinuses: Mucosal thickening and possible mucous retention cysts in the left maxillary sinus. Other: Mastoid air cells are clear. ASPECTS [facility] Stroke Program Early CT Score) - Ganglionic level infarction (caudate, lentiform nuclei, internal capsule, insula, M1-M3 cortex): 7 - Supraganglionic infarction (M4-M6 cortex): 3 Total score (0-10 with 10 being normal): 10 IMPRESSION: 1. No CT evidence of acute intracranial abnormality. 2. ASPECTS is 10 These results were communicated to Dr. Doretta Gant At 6:24 pm on 08/21/2023 by text page via the [facility] messaging system. Electronically Signed   By: Denny Flack M.D.   On: 08/21/2023 18:24     Time coordinating discharge: Over 30 minutes    Faith Homes, MD  [facility] 08/25/2023, 10:06 AM

## 2023-08-25 NOTE — TOC Transition Note (Signed)
 Transition of Care [facility]) - Discharge Note   Patient Details  Name: Matthew Armstrong. MRN: [id number] Date of Birth: 1980-07-09  [facility] CM/SW Contact:  Jonathan Neighbor, RN Phone Number: 08/25/2023, 10:27 AM   Clinical Narrative:     CM received call from officer Deal of [facility] that pt will be picked up at 1 pm for transport to [facility]. He has confirmed they will have  bed available.  MD and bedside RN updated.   Final next level of care: [facility] Barriers to Discharge: No Barriers Identified   Patient Goals and CMS Choice            Discharge Placement                       Discharge Plan and Services Additional resources added to the After Visit Summary for                                       Social Drivers of Health (SDOH) Interventions SDOH Screenings   Food Insecurity: Patient Declined (08/21/2023)  Housing: Patient Declined (08/21/2023)  Transportation Needs: Patient Declined (08/21/2023)  Utilities: Patient Declined (08/21/2023)  Financial Resource Strain: Low Risk  (09/14/2022)   Received from [facility]  Social Connections: Unknown (09/05/2022)   Received from [facility]  Tobacco Use: High Risk (08/21/2023)     Readmission Risk Interventions     No data to display

## 2023-08-25 NOTE — Progress Notes (Signed)
 Discharged.  Transported by w/c accompanied by 3 guards.

## 2023-10-09 ENCOUNTER — Other Ambulatory Visit: Payer: Self-pay

## 2023-10-09 ENCOUNTER — Observation Stay (HOSPITAL_COMMUNITY)
Admission: EM | Admit: 2023-10-09 | Discharge: 2023-10-10 | Disposition: A | Discharge status: Home / Self Care | Attending: Emergency Medicine | Admitting: Emergency Medicine

## 2023-10-09 ENCOUNTER — Emergency Department (HOSPITAL_COMMUNITY)

## 2023-10-09 ENCOUNTER — Encounter (HOSPITAL_COMMUNITY): Payer: Self-pay | Admitting: Emergency Medicine

## 2023-10-09 DIAGNOSIS — Z7901 Long term (current) use of anticoagulants: Secondary | ICD-10-CM | POA: Insufficient documentation

## 2023-10-09 DIAGNOSIS — R29818 Other symptoms and signs involving the nervous system: Secondary | ICD-10-CM | POA: Diagnosis not present

## 2023-10-09 DIAGNOSIS — R531 Weakness: Principal | ICD-10-CM

## 2023-10-09 DIAGNOSIS — Z8679 Personal history of other diseases of the circulatory system: Secondary | ICD-10-CM

## 2023-10-09 DIAGNOSIS — Z765 Malingerer [conscious simulation]: Secondary | ICD-10-CM | POA: Insufficient documentation

## 2023-10-09 LAB — CBC
HCT: 41.1 % (ref 39.0–52.0)
Hemoglobin: 14 g/dL (ref 13.0–17.0)
MCH: 31.2 pg (ref 26.0–34.0)
MCHC: 34.1 g/dL (ref 30.0–36.0)
MCV: 91.5 fL (ref 80.0–100.0)
Platelets: 202 K/uL (ref 150–400)
RBC: 4.49 MIL/uL (ref 4.22–5.81)
RDW: 12 % (ref 11.5–15.5)
WBC: 6.4 K/uL (ref 4.0–10.5)
nRBC: 0 % (ref 0.0–0.2)

## 2023-10-09 LAB — DIFFERENTIAL
Abs Immature Granulocytes: 0.02 K/uL (ref 0.00–0.07)
Basophils Absolute: 0.1 K/uL (ref 0.0–0.1)
Basophils Relative: 1 %
Eosinophils Absolute: 0.2 K/uL (ref 0.0–0.5)
Eosinophils Relative: 4 %
Immature Granulocytes: 0 %
Lymphocytes Relative: 47 %
Lymphs Abs: 3 K/uL (ref 0.7–4.0)
Monocytes Absolute: 0.4 K/uL (ref 0.1–1.0)
Monocytes Relative: 7 %
Neutro Abs: 2.7 K/uL (ref 1.7–7.7)
Neutrophils Relative %: 41 %

## 2023-10-09 LAB — COMPREHENSIVE METABOLIC PANEL WITH GFR
ALT: 27 U/L (ref 0–44)
AST: 21 U/L (ref 15–41)
Albumin: 4 g/dL (ref 3.5–5.0)
Alkaline Phosphatase: 70 U/L (ref 38–126)
Anion gap: 9 (ref 5–15)
BUN: 11 mg/dL (ref 6–20)
CO2: 25 mmol/L (ref 22–32)
Calcium: 8.9 mg/dL (ref 8.9–10.3)
Chloride: 106 mmol/L (ref 98–111)
Creatinine, Ser: 0.77 mg/dL (ref 0.61–1.24)
GFR, Estimated: >60 mL/min (ref >60)
Glucose, Bld: 93 mg/dL (ref 70–99)
Potassium: 3.7 mmol/L (ref 3.5–5.1)
Sodium: 140 mmol/L (ref 135–145)
Total Bilirubin: 0.9 mg/dL (ref 0.0–1.2)
Total Protein: 6.4 g/dL — ABNORMAL LOW (ref 6.5–8.1)

## 2023-10-09 LAB — POCT I-STAT, CHEM 8
BUN: 12 mg/dL (ref 6–20)
Calcium, Ion: 1.16 mmol/L (ref 1.15–1.40)
Chloride: 103 mmol/L (ref 98–111)
Creatinine, Ser: 0.7 mg/dL (ref 0.61–1.24)
Glucose, Bld: 93 mg/dL (ref 70–99)
HCT: 40 % (ref 39.0–52.0)
Hemoglobin: 13.6 g/dL (ref 13.0–17.0)
Potassium: 3.8 mmol/L (ref 3.5–5.1)
Sodium: 141 mmol/L (ref 135–145)
TCO2: 25 mmol/L (ref 22–32)

## 2023-10-09 LAB — PROTIME-INR
INR: 0.9 (ref 0.8–1.2)
Prothrombin Time: 13 s (ref 11.4–15.2)

## 2023-10-09 LAB — ETHANOL: Alcohol, Ethyl (B): <15 mg/dL (ref <15)

## 2023-10-09 LAB — APTT: aPTT: 26 s (ref 24–36)

## 2023-10-09 MED ORDER — DIPHENHYDRAMINE HCL 50 MG/ML IJ SOLN
12.5000 mg | Freq: Once | INTRAMUSCULAR | Status: AC
  Administered 2023-10-09: 12.5 mg via INTRAVENOUS
  Filled 2023-10-09: qty 1

## 2023-10-09 MED ORDER — METOCLOPRAMIDE HCL 5 MG/ML IJ SOLN
10.0000 mg | Freq: Once | INTRAMUSCULAR | Status: AC
  Administered 2023-10-09: 10 mg via INTRAVENOUS
  Filled 2023-10-09: qty 2

## 2023-10-09 NOTE — Consult Note (Addendum)
 NEUROLOGY CONSULT NOTE   Date of service: October 09, 2023 Patient Name: Matthew Armstrong. MRN:  [id number] DOB:  01-Jun-1980 Chief Complaint: Left-sided weakness Requesting Provider: Carita Senior, MD  History of Present Illness  Matthew Armstrong. is a 43 y.o. male with hx of elf-reported history of self-reported A-fib on anticoagulation not reported Eliquis  but no record of the medicine, stroke with left-sided weakness that had resolved and multiple episodes of left-sided weakness that have been negative on MRI imaging with clinical exam concerning for functional neurological disorder presented from jail with his stereotypic complaints of left facial droop, left-sided weakness and slurred speech of sudden onset with last known well at 2220 hrs. Also complains of a headache..  No chest pain shortness of breath. EMS activated a code stroke in the field.  Patient was evaluated at the [facility]  Patient has had multiple presentations with similar symptoms and also received TNK/tPA multiple times in the past for the symptoms again with negative MRI imaging for stroke.  Multiple physicians across multiple health systems have concern for this being functional neurological disorder  LKW: 2220 Modified rankin score: 0-Completely asymptomatic and back to baseline post- stroke IV Thrombolysis: no -- functional exam (patient reported eliquis  but no record from jail) EVT: Functional exam-not consistent with LVO  NIHSS components Score: Comment  1a Level of Conscious 0[x]  1[]  2[]  3[]      1b LOC Questions 0[x]  1[]  2[]       1c LOC Commands 0[x]  1[]  2[]       2 Best Gaze 0[x]  1[]  2[]       3 Visual 0[x]  1[]  2[]  3[]      4 Facial Palsy 0[]  1[]  2[x]  3[]      5a Motor Arm - left 0[]  1[]  2[]  3[x]  4[]  UN[]    5b Motor Arm - Right 0[x]  1[]  2[]  3[]  4[]  UN[]    6a Motor Leg - Left 0[]  1[]  2[]  3[x]  4[]  UN[]    6b Motor Leg - Right 0[x]  1[]  2[]  3[]  4[]  UN[]    7 Limb Ataxia 0[x]  1[]  2[]  UN[]      8 Sensory 0[]  1[]   2[x]  UN[]      9 Best Language 0[x]  1[]  2[]  3[]      10 Dysarthria 0[]  1[x]  2[]  UN[]      11 Extinct. and Inattention 0[x]  1[]  2[]       TOTAL: 11      ROS  Comprehensive ROS performed and pertinent positives documented in HPI   Past History   Past Medical History:  Diagnosis Date   Bladder disorder    Functional neurological symptom disorder with mixed symptoms    Neurogenic bladder    Stroke (HCC) 2009    Past Surgical History:  Procedure Laterality Date   BACK SURGERY      Family History: History reviewed. No pertinent family history.  Social History  reports that he has been smoking cigarettes. He started smoking about 10 years ago. He has a 5.2 pack-year smoking history. He has never used smokeless tobacco. He reports that he does not currently use alcohol. He reports that he does not use drugs.  Allergies  Allergen Reactions   Acetaminophen  Nausea Only   Ciprofloxacin Hives   Clindamycin/Lincomycin Other (See Comments)    Unknown reaction   Ibuprofen Nausea And Vomiting    Abdominal pain, difficulty breathing   Naproxen Other (See Comments)    Not listed on MAR   Nsaids     Pt tolerates ASA   Tetracyclines & Related Diarrhea  and Nausea And Vomiting    Abdominal pain    Medications  No current facility-administered medications for this encounter.  Current Outpatient Medications:    aspirin  EC 81 MG tablet, Take 81 mg by mouth every morning. Swallow whole., Disp: , Rfl:    atorvastatin  (LIPITOR ) 80 MG tablet, Take 1 tablet (80 mg total) by mouth daily., Disp: 30 tablet, Rfl: 1   sertraline  (ZOLOFT ) 50 MG tablet, Take 1 tablet (50 mg total) by mouth daily. (Patient not taking: Reported on 08/21/2023), Disp: 30 tablet, Rfl: 1  Vitals   Vitals:   2023-11-07 2309 11/07/23 2310 07-Nov-2023 2311  BP: (!) 147/92    Pulse:  77   Resp:  20   SpO2:  100%   Weight:   80.6 kg    Body mass index is 25.5 kg/m.   Physical Exam   General: Awake alert in no  distress HEENT: Normocephalic atraumatic Lungs: Clear Cardiovascular: Regular rhythm Neurological exam Awake alert oriented x 3 Mild dysarthria No aphasia Cranial 2-12: Pupils equal round react light, extraocular movements intact, visual fields full, volitional left lower face weakness as evidenced by holding his mouth closed on the left side while talking, facial sensation diminished on the left with sharp cut off in the midline and absent sensation to vibratory sense on the left forehead. Motor examination with flaccid left upper and lower extremity with positive Hoover sign Sensory: Complete diminished sensation on the left which are Katherin in the midline Coordination: No gross deficits on the right - could not perform on left  Labs/Imaging/Neurodiagnostic studies   CBC:  Recent Labs  Lab 11/07/23 2200 11-07-2023 2301  WBC 6.4  --   NEUTROABS 2.7  --   HGB 14.0 13.6  HCT 41.1 40.0  MCV 91.5  --   PLT 202  --    Basic Metabolic Panel:  Lab Results  Component Value Date   NA 141 2023/11/07   K 3.8 11-07-23   CO2 25 11-07-2023   GLUCOSE 93 2023-11-07   BUN 12 11/07/23   CREATININE 0.70 11/07/2023   CALCIUM  8.9 November 07, 2023   GFRNONAA >60 2023/11/07   GFRAA >60 10/14/2019   Lipid Panel:  Lab Results  Component Value Date   LDLCALC 115 (H) 08/22/2023   HgbA1c:  Lab Results  Component Value Date   HGBA1C 5.0 08/22/2023   Urine Drug Screen:     Component Value Date/Time   LABOPIA NONE DETECTED 08/21/2023 1826   COCAINSCRNUR NONE DETECTED 08/21/2023 1826   LABBENZ NONE DETECTED 08/21/2023 1826   AMPHETMU NONE DETECTED 08/21/2023 1826   THCU POSITIVE (A) 08/21/2023 1826   LABBARB NONE DETECTED 08/21/2023 1826    Alcohol Level     Component Value Date/Time   ETH <15 11-07-2023 2200   INR  Lab Results  Component Value Date   INR 0.9 11-07-23   APTT  Lab Results  Component Value Date   APTT 26 11-07-23   CT Head without contrast(Personally  reviewed): No acute findings  ASSESSMENT   Matthew Armstrong. is a 43 y.o. male prior medical history of A-fib on Eliquis , multiple presentations for strokelike symptoms of left-sided weakness, left facial droop and slurred speech for which he had in the past received IV thrombolysis multiple times, presents for same symptoms again. Exam has a very strong functional component with volitional mild holding, and volitional weakness on the left side. He reports being on Eliquis  but I can not confirm based on jail records  received in an envelope I do not have a suspicion for LVO hence I did not pursue advanced vascular imaging. Given his history of atrial fibrillation, I would recommend an MRI to rule out a stroke completely.  Impression: Strokelike symptoms-functional neurological disorder with weakness  RECOMMENDATIONS  MRI brain without contrast If negative, no further inpatient workup-needs psychiatry consultation for functional neurological disorder  Plan discussed with Dr. Carita ______________________________________________________________________    Signed, Eligio Lav, MD [facility]

## 2023-10-09 NOTE — Code Documentation (Signed)
 Stroke Response Nurse Documentation Code Documentation  Matthew Armstrong. is a 43 y.o. male arriving to [facility]  via [facility] on 7/25 with past medical hx of afib, self-reported history of stroke with left-sided weakness that had resolved and multiple episodes of left-sided weakness that have been negative on MRI imaging with clinical exam concerning for functional neurological disorder. On Eliquis  (apixaban ) daily. Code stroke was activated by EMS.   Patient from jail where he was LKW at 2220 and now complaining of complaints of left facial droop, left-sided weakness and slurred speech of sudden onset .   Stroke team at the bedside on patient arrival. Labs drawn and patient cleared for CT by Dr. Carita. Patient to CT with team. NIHSS 11, see documentation for details and code stroke times. Patient with left facial droop, left arm weakness, left leg weakness, left decreased sensation, and dysarthria  on exam. The following imaging was completed:  CT Head and MRI. Patient is not a candidate for IV Thrombolytic due to Eliquis . Patient is not a candidate for IR due to functional neurological exam not consistent with LVO.   Care Plan: MRI.    Bedside handoff with ED RN Abby.    Griselda Alm LELON  Rapid Response RN

## 2023-10-09 NOTE — ED Triage Notes (Signed)
 Pt arrives w/ [employer] from jail. Code stroke called in field. Symptoms started around 2220. Pt developed headache, L sided deficit and slurred speech. Pt does have facial droop. Hx stroke. A&Ox4

## 2023-10-09 NOTE — ED Provider Notes (Signed)
 [facility] Provider Note   CSN: [id number] Arrival date & time: 10/09/23  2309     Patient presents with: Code Stroke   Matthew Armstrong. is a 43 y.o. male.    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  Presents from jail as code stroke with left-sided weakness to face, arm and leg with slurred speech.  Symptoms onset about 20-20 p.m. while he was sitting in his bunk.  Associate with gradual onset right-sided headache.  Denies trauma or fall.  Seen by neurology Dr. Deedra on arrival. Does take Eliquis . Also complains of some left-sided chest pain onset about the same time that radiates to his left arm.  No associated shortness of breath.   The history is provided by the patient.       Prior to Admission medications   Medication Sig Start Date End Date Taking? Authorizing Provider  aspirin  EC 81 MG tablet Take 81 mg by mouth every morning. Swallow whole.    [provider]  atorvastatin  (LIPITOR ) 80 MG tablet Take 1 tablet (80 mg total) by mouth daily. 04/26/23   Remi Pippin, NP  sertraline  (ZOLOFT ) 50 MG tablet Take 1 tablet (50 mg total) by mouth daily. Patient not taking: Reported on 08/21/2023 04/26/23   Remi Pippin, NP    Allergies: Acetaminophen , Ciprofloxacin, Clindamycin/lincomycin, Ibuprofen, Naproxen, Nsaids, and Tetracyclines & related    Review of Systems  Constitutional:  Negative for activity change, appetite change and fever.  HENT:  Negative for congestion.   Respiratory:  Positive for chest tightness.   Cardiovascular:  Positive for chest pain.  Gastrointestinal:  Negative for abdominal pain, nausea and vomiting.  Genitourinary:  Negative for dysuria.  Musculoskeletal:  Negative for arthralgias and myalgias.  Skin:  Negative for rash.  Neurological:  Positive  for weakness, numbness and headaches.   all other systems are negative except as noted in the HPI and PMH.    Updated Vital Signs BP (!) 147/92   Pulse 77   Resp 20   Wt 80.6 kg   SpO2 100%   BMI 25.50 kg/m   Physical Exam Vitals and nursing note reviewed.  Constitutional:      General: He is not in acute distress.    Appearance: He is well-developed.  HENT:     Head: Normocephalic and atraumatic.     Mouth/Throat:     Pharynx: No oropharyngeal exudate.  Eyes:     Conjunctiva/sclera: Conjunctivae normal.     Pupils: Pupils are equal, round, and reactive to light.  Neck:     Comments: No meningismus. Cardiovascular:     Rate and Rhythm: Normal rate and regular rhythm.     Heart sounds: Normal heart sounds. No murmur heard. Pulmonary:     Effort: Pulmonary effort is normal. No respiratory distress.     Breath sounds: Normal breath sounds.  Chest:     Chest wall: Tenderness present.  Abdominal:     Palpations: Abdomen is soft.     Tenderness: There is no abdominal tenderness. There is no guarding or rebound.  Musculoskeletal:        General: No tenderness. Normal range of motion.     Cervical back: Normal range of motion and neck supple.  Skin:    General: Skin is warm.  Neurological:     Mental Status: He is alert  and oriented to person, place, and time.     Cranial Nerves: Cranial nerve deficit present.     Motor: Weakness present. No abnormal muscle tone.     Coordination: Coordination normal.     Comments: Left-sided facial droop, dysarthria, flaccid left arm and leg with difficulty holding up against gravity.  Does protect arm face from falling arm. Holds left side of mouth close with talking.  Diminished sensation left face, arm and leg.  Psychiatric:        Behavior: Behavior normal.     (all labs ordered are listed, but only abnormal results are displayed) Labs Reviewed  COMPREHENSIVE METABOLIC PANEL WITH GFR - Abnormal; Notable for the following  components:      Result Value   Total Protein 6.4 (*)    All other components within normal limits  ETHANOL  PROTIME-INR  APTT  CBC  DIFFERENTIAL  RAPID URINE DRUG SCREEN, HOSP PERFORMED  I-STAT CHEM 8, ED  POCT I-STAT, CHEM 8  TROPONIN I (HIGH SENSITIVITY)  TROPONIN I (HIGH SENSITIVITY)    EKG: None  Radiology: MR BRAIN WO CONTRAST Result Date: 10/10/2023 CLINICAL DATA:  Neuro deficit, acute, stroke suspected EXAM: MRI HEAD WITHOUT CONTRAST TECHNIQUE: Multiplanar, multiecho pulse sequences of the brain and surrounding structures were obtained without intravenous contrast. COMPARISON:  CT head 10/09/2023. FINDINGS: Brain: No acute infarction, hemorrhage, hydrocephalus, extra-axial collection or mass lesion. Vascular: Major arterial flow voids are maintained at the skull base. Skull and upper cervical spine: Normal marrow signal. Sinuses/Orbits: Left maxillary sinus retention cyst. No acute orbital findings. Other: No mastoid effusions. IMPRESSION: No evidence of acute intracranial abnormality. Electronically Signed   By: Gilmore GORMAN Molt M.D.   On: 10/10/2023 03:14   CT HEAD CODE STROKE WO CONTRAST Result Date: 10/09/2023 CLINICAL DATA:  Code stroke.  Neuro deficit, acute, stroke suspected EXAM: CT HEAD WITHOUT CONTRAST TECHNIQUE: Contiguous axial images were obtained from the base of the skull through the vertex without intravenous contrast. RADIATION DOSE REDUCTION: This exam was performed according to the departmental dose-optimization program which includes automated exposure control, adjustment of the mA and/or kV according to patient size and/or use of iterative reconstruction technique. COMPARISON:  MRI head and CT head  June 6, 25. FINDINGS: Brain: No evidence of acute infarction, hemorrhage, hydrocephalus, extra-axial collection or mass lesion/mass effect. Vascular: No hyperdense vessel. Skull: No acute fracture. Sinuses/Orbits: Left maxillary sinus retention cyst. Otherwise, clear  sinuses. No acute orbital findings. Other: No mastoid effusions. ASPECTS [facility] Stroke Program Early CT Score) Total score (0-10 with 10 being normal): 10. IMPRESSION: No evidence of acute intracranial abnormality.  ASPECTS is 10. Code stroke imaging results were communicated on 10/09/2023 at 11:08 pm to provider Dr. Voncile via telephone, who verbally acknowledged these results. Electronically Signed   By: Gilmore GORMAN Molt M.D.   On: 10/09/2023 23:08     .Critical Care  Performed by: Carita Senior, MD Authorized by: Carita Senior, MD   Critical care provider statement:    Critical care time (minutes):  40   Critical care time was exclusive of:  Separately billable procedures and treating other patients   Critical care was necessary to treat or prevent imminent or life-threatening deterioration of the following conditions:  CNS failure or compromise   Critical care was time spent personally by me on the following activities:  Development of treatment plan with patient or surrogate, discussions with consultants, evaluation of patient's response to treatment, examination of patient, ordering and review of laboratory studies,  ordering and review of radiographic studies, ordering and performing treatments and interventions, pulse oximetry, re-evaluation of patient's condition, review of old charts and blood draw for specimens   I assumed direction of critical care for this patient from another provider in my specialty: no     Care discussed with: admitting provider      Medications Ordered in the ED - No data to display                                  Medical Decision Making Amount and/or Complexity of Data Reviewed Labs: ordered. Decision-making details documented in ED Course. Radiology: ordered and independent interpretation performed. Decision-making details documented in ED Course. ECG/medicine tests: ordered and independent interpretation performed. Decision-making details  documented in ED Course.  Risk Prescription drug management. Decision regarding hospitalization.   Code stroke from jail with left-sided deficits.  Seen by neurology Dr. Voncile on arrival.  Patient with known history of functional neurological deficits as well as malingering.  Does take Eliquis  even though it was stopped during his last hospitalization in June.  CT head negative for hemorrhage on arrival.  Results reviewed and interpreted by me.  Patient with multiple previous similar presentations with left-sided deficits he is questionably on Eliquis  though his last discharge summary says he was discharged on just aspirin  he says he is still taking Eliquis .  Therefore he is not a TNK candidate.  He has been seen by neurology Dr. Voncile who is familiar with patient.  Recommends MRI but no further workup for suspected functional neurological disorder.  Workup is reassuring. EKG is normal sinus rhythm. Labs unremarkable. Troponin negative x2 with low suspicion for ACS.  Migraine cocktail given. Patient states he can take toradol  despite NSAID allergy.  MRI negative for acute infarct.  Still with L sided deficits on recheck. Unable to stand or walk. D/w Dr. Arora. No further neurological workup recommended. May consider psychiatry consultation.   Given patient's inability to safely ambulate, he is unsafe for discharge back to jail. Secondary gain may be playing a role.   Plan observation admission for theparies and possible psychiatry evaluation. D/w Dr. Shona     Final diagnoses:  None    ED Discharge Orders     None          Jami Ohlin, Garnette, MD 10/10/23 [phone]

## 2023-10-10 ENCOUNTER — Emergency Department (HOSPITAL_COMMUNITY)

## 2023-10-10 DIAGNOSIS — R531 Weakness: Secondary | ICD-10-CM | POA: Diagnosis not present

## 2023-10-10 LAB — TROPONIN I (HIGH SENSITIVITY)
Troponin I (High Sensitivity): 4 ng/L (ref <18)
Troponin I (High Sensitivity): 3 ng/L (ref <18)

## 2023-10-10 MED ORDER — LACTATED RINGERS IV SOLN: INTRAVENOUS | Status: DC

## 2023-10-10 MED ORDER — KETOROLAC TROMETHAMINE 15 MG/ML IJ SOLN
15.0000 mg | Freq: Once | INTRAMUSCULAR | Status: AC
  Administered 2023-10-10: 15 mg via INTRAVENOUS
  Filled 2023-10-10: qty 1

## 2023-10-10 MED ORDER — PROCHLORPERAZINE EDISYLATE 10 MG/2ML IJ SOLN: 5.0000 mg | Freq: Four times a day (QID) | INTRAMUSCULAR | Status: DC | PRN

## 2023-10-10 MED ORDER — ACETAMINOPHEN 325 MG PO TABS
650.0000 mg | ORAL_TABLET | Freq: Four times a day (QID) | ORAL | Status: DC | PRN
  Administered 2023-10-10: 650 mg via ORAL
  Filled 2023-10-10: qty 2

## 2023-10-10 NOTE — Discharge Summary (Signed)
 Physician Discharge Summary   Patient: Matthew Armstrong. MRN: [id number] DOB: 1981/02/18  Admit date:     10/09/2023  Discharge date:   Discharge Physician: Marsha Ada   PCP: Pcp, No   Recommendations at discharge:    Pt is medically stable for discharge to jail/prison.  Discharge Diagnoses: Principal Problem:   Left-sided weakness  Resolved Problems:   * No resolved hospital problems. [facility] Course: Per Neurology consult, Matthew Armstrong. is a 43 y.o. male with hx of elf-reported history of self-reported A-fib on anticoagulation not reported Eliquis  but no record of the medicine, stroke with left-sided weakness that had resolved and multiple episodes of left-sided weakness that have been negative on MRI imaging with clinical exam concerning for functional neurological disorder presented from jail with his stereotypic complaints of left facial droop, left-sided weakness and slurred speech of sudden onset with last known well at 2220 hrs. Also complains of a headache..  No chest pain shortness of breath. EMS activated a code stroke in the field.  Patient was evaluated at the [facility]   Patient has had multiple presentations with similar symptoms and also received TNK/tPA multiple times in the past for the symptoms again with negative MRI imaging for stroke.  Multiple physicians across multiple health systems have concern for this being functional neurological disorder.   Assessment and Plan:  Functional Neurological disorder Malingering Presumed L sided weakness -Neuro consulted; recs: MRI neg for acute stroke and exam c/w functional neurological disorder -Psych deferred examination given multiple prior evals w/o benefit -MRI negative for acute stroke -Stable for discharge back to prison      Consultants: Neurology  Procedures performed: N/A  Disposition: Home Diet recommendation:  Discharge Diet Orders (From admission, onward)     Start     Ordered   10/10/23  0000  Diet - low sodium heart healthy        10/10/23 0959           Regular diet DISCHARGE MEDICATION: Allergies as of 10/10/2023       Reactions   Acetaminophen  Nausea Only   Ciprofloxacin Hives   Clindamycin/lincomycin Other (See Comments)   Unknown reaction   Ibuprofen Nausea And Vomiting   Abdominal pain, difficulty breathing   Naproxen Other (See Comments)   Not listed on MAR   Nsaids    Pt tolerates ASA   Tetracyclines & Related Diarrhea, Nausea And Vomiting   Abdominal pain        Medication List     TAKE these medications    aspirin  EC 81 MG tablet Take 81 mg by mouth every morning. Swallow whole.   atorvastatin  80 MG tablet Commonly known as: Lipitor  Take 1 tablet (80 mg total) by mouth daily. What changed: when to take this   prazosin  2 MG capsule Commonly known as: MINIPRESS  Take 2 mg by mouth at bedtime.   sertraline  50 MG tablet Commonly known as: ZOLOFT  Take 1 tablet (50 mg total) by mouth daily. What changed: when to take this        Discharge Exam: Filed Weights   10/09/23 2311  Weight: 80.6 kg   General: Alert, oriented x3, resting comfortably in no acute distress Respiratory: Lungs clear to auscultation bilaterally with normal respiratory effort; no w/r/r Cardiovascular: Regular rate and rhythm w/o m/r/g   Condition at discharge: stable  The results of significant diagnostics from this hospitalization (including imaging, microbiology, ancillary and laboratory) are listed below for reference.  Imaging Studies: MR BRAIN WO CONTRAST Result Date: 10/10/2023 CLINICAL DATA:  Neuro deficit, acute, stroke suspected EXAM: MRI HEAD WITHOUT CONTRAST TECHNIQUE: Multiplanar, multiecho pulse sequences of the brain and surrounding structures were obtained without intravenous contrast. COMPARISON:  CT head 10/09/2023. FINDINGS: Brain: No acute infarction, hemorrhage, hydrocephalus, extra-axial collection or mass lesion. Vascular: Major  arterial flow voids are maintained at the skull base. Skull and upper cervical spine: Normal marrow signal. Sinuses/Orbits: Left maxillary sinus retention cyst. No acute orbital findings. Other: No mastoid effusions. IMPRESSION: No evidence of acute intracranial abnormality. Electronically Signed   By: Gilmore GORMAN Molt M.D.   On: 10/10/2023 03:14   CT HEAD CODE STROKE WO CONTRAST Result Date: 10/09/2023 CLINICAL DATA:  Code stroke.  Neuro deficit, acute, stroke suspected EXAM: CT HEAD WITHOUT CONTRAST TECHNIQUE: Contiguous axial images were obtained from the base of the skull through the vertex without intravenous contrast. RADIATION DOSE REDUCTION: This exam was performed according to the departmental dose-optimization program which includes automated exposure control, adjustment of the mA and/or kV according to patient size and/or use of iterative reconstruction technique. COMPARISON:  MRI head and CT head  June 6, 25. FINDINGS: Brain: No evidence of acute infarction, hemorrhage, hydrocephalus, extra-axial collection or mass lesion/mass effect. Vascular: No hyperdense vessel. Skull: No acute fracture. Sinuses/Orbits: Left maxillary sinus retention cyst. Otherwise, clear sinuses. No acute orbital findings. Other: No mastoid effusions. ASPECTS [facility] Stroke Program Early CT Score) Total score (0-10 with 10 being normal): 10. IMPRESSION: No evidence of acute intracranial abnormality.  ASPECTS is 10. Code stroke imaging results were communicated on 10/09/2023 at 11:08 pm to provider Dr. Voncile via telephone, who verbally acknowledged these results. Electronically Signed   By: Gilmore GORMAN Molt M.D.   On: 10/09/2023 23:08    Microbiology: Results for orders placed or performed during the hospital encounter of 08/21/23  MRSA Next Gen by PCR, Nasal     Status: Abnormal   Collection Time: 08/21/23 11:25 PM   Specimen: Nasal Mucosa; Nasal Swab  Result Value Ref Range Status   MRSA by PCR Next Gen DETECTED (A)  NOT DETECTED Final    Comment: RESULT CALLED TO, READ BACK BY AND VERIFIED WITH: T BURTON,RN@0219  08/22/23 MK (NOTE) The GeneXpert MRSA Assay (FDA approved for NASAL specimens only), is one component of a comprehensive MRSA colonization surveillance program. It is not intended to diagnose MRSA infection nor to guide or monitor treatment for MRSA infections. Test performance is not FDA approved in patients less than 31 years old. Performed at [facility], [address], [place], [place] [address]     Labs: CBC: Recent Labs  Lab 10/09/23 2200 10/09/23 2301  WBC 6.4  --   NEUTROABS 2.7  --   HGB 14.0 13.6  HCT 41.1 40.0  MCV 91.5  --   PLT 202  --    Basic Metabolic Panel: Recent Labs  Lab 10/09/23 2200 10/09/23 2301  NA 140 141  K 3.7 3.8  CL 106 103  CO2 25  --   GLUCOSE 93 93  BUN 11 12  CREATININE 0.77 0.70  CALCIUM  8.9  --    Liver Function Tests: Recent Labs  Lab 10/09/23 2200  AST 21  ALT 27  ALKPHOS 70  BILITOT 0.9  PROT 6.4*  ALBUMIN 4.0   CBG: No results for input(s): GLUCAP in the last 168 hours.  Discharge time spent: less than 30 minutes.  Signed: Marsha Ada, MD [facility] 10/10/2023

## 2023-10-10 NOTE — ED Notes (Signed)
 CCMD called.

## 2023-10-10 NOTE — ED Notes (Signed)
 Pt transported to MRI

## 2023-10-10 NOTE — ED Notes (Signed)
Pt unable to ambulate. 

## 2023-10-10 NOTE — ED Notes (Signed)
 [facility] department deput given paper work Licensed convey[employer] transport to arrive.

## 2023-10-10 NOTE — Plan of Care (Signed)
 EDP reports patient unable to ambulate-will admit to hospitalist. MRI negative for stroke. Symptoms are clearly consistent with a functional neurological disorder At this point, no further inpatient workup recommended. May consider psychiatry evaluation Plan discussed with Dr. Carita Inpatient neurology will be available as needed.   -- Eligio Lav, MD Neurologist [facility]

## 2024-04-16 ENCOUNTER — Emergency Department (HOSPITAL_COMMUNITY)

## 2024-04-16 ENCOUNTER — Emergency Department (HOSPITAL_COMMUNITY)
Admission: EM | Admit: 2024-04-16 | Discharge: 2024-04-16 | Disposition: A | Discharge status: Home / Self Care | Attending: Emergency Medicine | Admitting: Emergency Medicine

## 2024-04-16 ENCOUNTER — Other Ambulatory Visit: Payer: Self-pay

## 2024-04-16 DIAGNOSIS — R200 Anesthesia of skin: Secondary | ICD-10-CM | POA: Diagnosis not present

## 2024-04-16 DIAGNOSIS — R29810 Facial weakness: Secondary | ICD-10-CM

## 2024-04-16 DIAGNOSIS — R29818 Other symptoms and signs involving the nervous system: Secondary | ICD-10-CM | POA: Insufficient documentation

## 2024-04-16 DIAGNOSIS — R531 Weakness: Secondary | ICD-10-CM

## 2024-04-16 DIAGNOSIS — Z8673 Personal history of transient ischemic attack (TIA), and cerebral infarction without residual deficits: Secondary | ICD-10-CM | POA: Insufficient documentation

## 2024-04-16 LAB — CBC
HCT: 41.7 % (ref 39.0–52.0)
Hemoglobin: 14.6 g/dL (ref 13.0–17.0)
MCH: 31.7 pg (ref 26.0–34.0)
MCHC: 35 g/dL (ref 30.0–36.0)
MCV: 90.7 fL (ref 80.0–100.0)
Platelets: 228 10*3/uL (ref 150–400)
RBC: 4.6 MIL/uL (ref 4.22–5.81)
RDW: 11.9 % (ref 11.5–15.5)
WBC: 9.1 10*3/uL (ref 4.0–10.5)
nRBC: 0 % (ref 0.0–0.2)

## 2024-04-16 LAB — DIFFERENTIAL
Abs Immature Granulocytes: 0.02 10*3/uL (ref 0.00–0.07)
Basophils Absolute: 0.1 10*3/uL (ref 0.0–0.1)
Basophils Relative: 1 %
Eosinophils Absolute: 0.2 10*3/uL (ref 0.0–0.5)
Eosinophils Relative: 2 %
Immature Granulocytes: 0 %
Lymphocytes Relative: 33 %
Lymphs Abs: 3 10*3/uL (ref 0.7–4.0)
Monocytes Absolute: 0.7 10*3/uL (ref 0.1–1.0)
Monocytes Relative: 7 %
Neutro Abs: 5.2 10*3/uL (ref 1.7–7.7)
Neutrophils Relative %: 57 %

## 2024-04-16 LAB — COMPREHENSIVE METABOLIC PANEL WITH GFR
ALT: 33 U/L (ref 0–44)
AST: 23 U/L (ref 15–41)
Albumin: 4.7 g/dL (ref 3.5–5.0)
Alkaline Phosphatase: 109 U/L (ref 38–126)
Anion gap: 14 (ref 5–15)
BUN: 12 mg/dL (ref 6–20)
CO2: 24 mmol/L (ref 22–32)
Calcium: 9.5 mg/dL (ref 8.9–10.3)
Chloride: 103 mmol/L (ref 98–111)
Creatinine, Ser: 0.84 mg/dL (ref 0.61–1.24)
GFR, Estimated: >60 mL/min
Glucose, Bld: 92 mg/dL (ref 70–99)
Potassium: 3.9 mmol/L (ref 3.5–5.1)
Sodium: 141 mmol/L (ref 135–145)
Total Bilirubin: 0.5 mg/dL (ref 0.0–1.2)
Total Protein: 7.4 g/dL (ref 6.5–8.1)

## 2024-04-16 LAB — I-STAT CHEM 8, ED
BUN: 12 mg/dL (ref 6–20)
Calcium, Ion: 1.11 mmol/L — ABNORMAL LOW (ref 1.15–1.40)
Chloride: 103 mmol/L (ref 98–111)
Creatinine, Ser: 0.9 mg/dL (ref 0.61–1.24)
Glucose, Bld: 93 mg/dL (ref 70–99)
HCT: 41 % (ref 39.0–52.0)
Hemoglobin: 13.9 g/dL (ref 13.0–17.0)
Potassium: 3.8 mmol/L (ref 3.5–5.1)
Sodium: 142 mmol/L (ref 135–145)
TCO2: 24 mmol/L (ref 22–32)

## 2024-04-16 LAB — PROTIME-INR
INR: 1 (ref 0.8–1.2)
Prothrombin Time: 13.3 s (ref 11.4–15.2)

## 2024-04-16 LAB — CBG MONITORING, ED: Glucose-Capillary: 97 mg/dL (ref 70–99)

## 2024-04-16 LAB — ETHANOL: Alcohol, Ethyl (B): <15 mg/dL

## 2024-04-16 LAB — APTT: aPTT: 27 s (ref 24–36)

## 2024-04-16 MED ORDER — LORAZEPAM 2 MG/ML IJ SOLN
1.0000 mg | Freq: Once | INTRAMUSCULAR | Status: AC | PRN
  Administered 2024-04-16: 1 mg via INTRAVENOUS
  Filled 2024-04-16: qty 1

## 2024-04-16 NOTE — ED Notes (Signed)
Pt provided with urinal at bedside

## 2024-04-16 NOTE — Consult Note (Signed)
 NEUROLOGY CONSULT NOTE   Date of service: April 16, 2024 Patient Name: Matthew Armstrong. MRN:  [id number] DOB:  [birth year] Chief Complaint: L sided weakness Requesting Provider: Fredia Rosette Kirsch, MD  History of Present Illness  Matthew Rueda. is a 44 y.o. male with hx of self reported stroke, functional neurologic symptom disorder who presents with sudden onset L sided weakness, L facial droop, and L sided numbness. Symptoms started after dinner around 1800.  He was brought in to the ED as a code stroke by EMS.  Chart review shows multiple prior similar presentations with multiple negative MRIs and CT scans.  LKW: 1800 Modified rankin score: 0-Completely asymptomatic and back to baseline post- stroke IV Thrombolysis: not offered, low suspicion for stroke. EVT: not offered, low suspicion for stroke.  NIHSS components Score: Comment  1a Level of Conscious 0[x]  1[]  2[]  3[]      1b LOC Questions 0[x]  1[]  2[]       1c LOC Commands 0[x]  1[]  2[]       2 Best Gaze 0[x]  1[]  2[]       3 Visual 0[x]  1[]  2[]  3[]      4 Facial Palsy 0[]  1[x]  2[]  3[]      5a Motor Arm - left 0[]  1[]  2[]  3[x]  4[]  UN[]    5b Motor Arm - Right 0[x]  1[]  2[]  3[]  4[]  UN[]    6a Motor Leg - Left 0[]  1[]  2[]  3[x]  4[]  UN[]    6b Motor Leg - Right 0[x]  1[]  2[]  3[]  4[]  UN[]    7 Limb Ataxia 0[x]  1[]  2[]  UN[]      8 Sensory 0[]  1[]  2[x]  UN[]      9 Best Language 0[x]  1[]  2[]  3[]      10 Dysarthria 0[]  1[x]  2[]  UN[]      11 Extinct. and Inattention 0[x]  1[]  2[]       TOTAL: 10      ROS  Comprehensive ROS performed and pertinent positives documented in HPI   Past History   Past Medical History:  Diagnosis Date   Bladder disorder    Functional neurological symptom disorder with mixed symptoms    Neurogenic bladder    Stroke [facility]) 2009    Past Surgical History:  Procedure Laterality Date   BACK SURGERY      Family History: No family history on file.  Social History  reports that he has been smoking  cigarettes. He started smoking about 10 years ago. He has a 5.5 pack-year smoking history. He has never used smokeless tobacco. He reports that he does not currently use alcohol. He reports that he does not use drugs.  Allergies[1]  Medications  Current Medications[2]  Vitals   Vitals:   04/16/24 2155 04/16/24 2156 04/16/24 2213  BP:   (!) 138/93  Pulse: 65  61  Resp: 20  16  Temp:  98.3 F (36.8 C)   TempSrc:  Temporal   SpO2: 100%  98%    There is no height or weight on file to calculate BMI.   Physical Exam   General: Laying comfortably in bed; in no acute distress.  HENT: Normal oropharynx and mucosa. Normal external appearance of ears and nose.  Neck: Supple, no pain or tenderness  CV: No JVD. No peripheral edema.  Pulmonary: Symmetric Chest rise. Normal respiratory effort.  Abdomen: Soft to touch, non-tender.  Ext: No cyanosis, edema, or deformity  Skin: No rash. Normal palpation of skin.   Musculoskeletal: Normal digits and nails by inspection. No clubbing.   Neurologic Examination  Mental status/Cognition: Alert, oriented to self, place, month and year, good attention.  Speech/language: Slurred speech, fluent, comprehension intact, object naming intact, repetition intact.  Cranial nerves:   CN II Pupils equal and reactive to light, no VF deficits    CN III,IV,VI EOM intact, no gaze preference or deviation, no nystagmus    CN V Absent in L face with midline splitting.   CN VII Facial fold that improves with distraction   CN VIII normal hearing to speech    CN IX & X normal palatal elevation, no uvular deviation    CN XI 5/5 head turn and 5/5 shoulder shrug bilaterally    CN XII midline tongue protrusion    Motor:  Muscle bulk: normal, tone normal 5/5 in RUE 1-2/5 in LUE 5/5 in RLE 1-2/5 in LLE with positive hoovers sign.  Reflexes:  Right Left Comments  Pectoralis      Biceps (C5/6)     Brachioradialis (C5/6)      Triceps (C6/7)      Patellar (L3/4)       Achilles (S1)      Hoffman      Plantar     Jaw jerk    Sensation:  Light touch Absent in LUE and LLE   Pin prick    Temperature    Vibration   Proprioception    Coordination/Complex Motor:  - Finger to Nose intact in RUE - Gait: deferred.  Labs/Imaging/Neurodiagnostic studies   CBC:  Recent Labs  Lab May 09, 2024 2136  WBC 9.1  NEUTROABS 5.2  HGB 14.6  13.9  HCT 41.7  41.0  MCV 90.7  PLT 228   Basic Metabolic Panel:  Lab Results  Component Value Date   NA 141 May 09, 2024   NA 142 05-09-24   K 3.9 05/09/24   K 3.8 09-May-2024   CO2 24 05/09/24   GLUCOSE 92 2024-05-09   GLUCOSE 93 05-09-24   BUN 12 May 09, 2024   BUN 12 05/09/2024   CREATININE 0.84 05-09-2024   CREATININE 0.90 09-May-2024   CALCIUM  9.5 05-09-2024   GFRNONAA >60 09-May-2024   GFRAA >60 10/14/2019   Lipid Panel:  Lab Results  Component Value Date   LDLCALC 115 (H) 08/22/2023   HgbA1c:  Lab Results  Component Value Date   HGBA1C 5.0 08/22/2023   Urine Drug Screen:     Component Value Date/Time   LABOPIA NONE DETECTED 08/21/2023 1826   COCAINSCRNUR NONE DETECTED 08/21/2023 1826   LABBENZ NONE DETECTED 08/21/2023 1826   AMPHETMU NONE DETECTED 08/21/2023 1826   THCU POSITIVE (A) 08/21/2023 1826   LABBARB NONE DETECTED 08/21/2023 1826    Alcohol Level     Component Value Date/Time   [facility] <15 2024-05-09 2136   INR  Lab Results  Component Value Date   INR 1.0 05-09-24   APTT  Lab Results  Component Value Date   APTT 27 May 09, 2024   AED levels: No results found for: PHENYTOIN, ZONISAMIDE, LAMOTRIGINE, LEVETIRACETA  CT Head without contrast(Personally reviewed): CTH was negative for a large hypodensity concerning for a large territory infarct or hyperdensity concerning for an ICH  ASSESSMENT   Matthew Crisanto. is a 44 y.o. male ith hx of self reported stroke, functional neurologic symptom disorder who presents with sudden onset L sided weakness, L facial  droop, and L sided numbness. Symptoms started after dinner around 1800.  Exam notable for midline splitting sensory deficits along with positive hoovers and effort dependent weakness on the left.  Overall, presentation most concerning for either functional neurologic symptom disorder or malingering.  RECOMMENDATIONS  - Can get MRI brain, only if his symptoms are persistent. ______________________________________________________________________  Plan discussed with Olam Slocumb with the ED team.  Signed, Ellouise Mari, MD [facility]     [1]  Allergies Allergen Reactions   Acetaminophen  Nausea Only   Ciprofloxacin Hives   Clindamycin/Lincomycin Other (See Comments)    Unknown reaction   Ibuprofen Nausea And Vomiting    Abdominal pain, difficulty breathing   Naproxen Other (See Comments)    Not listed on MAR   Nsaids     Pt tolerates ASA   Tetracyclines & Related Diarrhea and Nausea And Vomiting    Abdominal pain  [2]  Current Facility-Administered Medications:    LORazepam  (ATIVAN ) injection 1 mg, 1 mg, Intravenous, Once PRN, [place] Olam HERO, PA-C  Current Outpatient Medications:    aspirin  EC 81 MG tablet, Take 81 mg by mouth every morning. Swallow whole., Disp: , Rfl:    atorvastatin  (LIPITOR ) 80 MG tablet, Take 1 tablet (80 mg total) by mouth daily. (Patient taking differently: Take 80 mg by mouth at bedtime.), Disp: 30 tablet, Rfl: 1   prazosin  (MINIPRESS ) 2 MG capsule, Take 2 mg by mouth at bedtime., Disp: , Rfl:    sertraline  (ZOLOFT ) 50 MG tablet, Take 1 tablet (50 mg total) by mouth daily. (Patient taking differently: Take 50 mg by mouth at bedtime.), Disp: 30 tablet, Rfl: 1

## 2024-04-16 NOTE — ED Provider Notes (Signed)
 " [place] EMERGENCY DEPARTMENT AT [facility] Provider Note   CSN: [id number] Arrival date & time: 04/16/24  2129     Patient presents with: Code Stroke (LSN 6 pm)   [place]. is a 44 y.o. male.   The history is provided by the patient and medical records.   44 year old male with history of functional neurologic issues, hypertension, hyperlipidemia, paroxysmal A-fib no longer on anticoagulation, presenting to the ED as a code stroke from [facility] jail.  Last known well around 6 PM this evening in his bunk.  He reports history of CVA, however all prior imaging has been negative.  He openly admits he has had this happen before but states it was not like this.  He denies any right-sided symptoms.  He has not had any seizure activity or incontinence.  Prior to Admission medications  Medication Sig Start Date End Date Taking? Authorizing Provider  aspirin  EC 81 MG tablet Take 81 mg by mouth every morning. Swallow whole.    [provider]  atorvastatin  (LIPITOR ) 80 MG tablet Take 1 tablet (80 mg total) by mouth daily. Patient taking differently: Take 80 mg by mouth at bedtime. 04/26/23   Remi Pippin, NP  prazosin  (MINIPRESS ) 2 MG capsule Take 2 mg by mouth at bedtime.    [provider]  sertraline  (ZOLOFT ) 50 MG tablet Take 1 tablet (50 mg total) by mouth daily. Patient taking differently: Take 50 mg by mouth at bedtime. 04/26/23   Remi Pippin, NP    Allergies: Acetaminophen , Ciprofloxacin, Clindamycin/lincomycin, Ibuprofen, Naproxen, Nsaids, and Tetracyclines & related    Review of Systems  Neurological:  Positive for weakness.  All other systems reviewed and are negative.   Updated Vital Signs BP (!) 138/93   Pulse 61   Temp 98.3 F (36.8 C) (Temporal)   Resp 16   SpO2 98%   Physical Exam Vitals and nursing note reviewed.  Constitutional:      Appearance: He is well-developed.  HENT:     Head: Normocephalic and atraumatic.  Eyes:      Conjunctiva/sclera: Conjunctivae normal.     Pupils: Pupils are equal, round, and reactive to light.  Cardiovascular:     Rate and Rhythm: Normal rate and regular rhythm.     Heart sounds: Normal heart sounds.  Pulmonary:     Effort: Pulmonary effort is normal.     Breath sounds: Normal breath sounds.  Abdominal:     General: Bowel sounds are normal.     Palpations: Abdomen is soft.  Musculoskeletal:        General: Normal range of motion.     Cervical back: Normal range of motion.  Skin:    General: Skin is warm and dry.  Neurological:     Mental Status: He is alert and oriented to person, place, and time.     Comments: AAOx3, answering questions and following commands appropriately; weak on LUE and LLE however this seems purposeful, talking out of the right side of his mouth but no drooling or stridor, able to pass swallow screen, right side appears normal     (all labs ordered are listed, but only abnormal results are displayed) Labs Reviewed  I-STAT CHEM 8, ED - Abnormal; Notable for the following components:      Result Value   Calcium , Ion 1.11 (*)    All other components within normal limits  PROTIME-INR  APTT  COMPREHENSIVE METABOLIC PANEL WITH GFR  ETHANOL  CBC  DIFFERENTIAL  URINE DRUG SCREEN  CBG MONITORING, ED    EKG: None  Radiology: CT HEAD CODE STROKE WO CONTRAST` Result Date: 04/16/2024 CLINICAL DATA:  Code stroke. Initial evaluation for acute neuro deficit, stroke suspected. EXAM: CT HEAD WITHOUT CONTRAST TECHNIQUE: Contiguous axial images were obtained from the base of the skull through the vertex without intravenous contrast. RADIATION DOSE REDUCTION: This exam was performed according to the departmental dose-optimization program which includes automated exposure control, adjustment of the mA and/or kV according to patient size and/or use of iterative reconstruction technique. COMPARISON:  Comparison made with prior MRI from 10/10/2023. FINDINGS: Brain:  Cerebral volume within normal limits for patient age. No acute intracranial hemorrhage. No acute large vessel territory infarct. No mass lesion, midline shift, or mass effect. Ventricles are normal in size without hydrocephalus. No extra-axial fluid collection. Vascular: No abnormal hyperdense vessel. Skull: Scalp soft tissues demonstrate no acute abnormality. Calvarium intact. Sinuses/Orbits: Globes and orbital soft tissues within normal limits. Left maxillary sinus retention cyst. Paranasal sinuses are otherwise clear. No mastoid effusion. ASPECTS [facility] Stroke Program Early CT Score) - Ganglionic level infarction (caudate, lentiform nuclei, internal capsule, insula, M1-M3 cortex): 7 - Supraganglionic infarction (M4-M6 cortex): 3 Total score (0-10 with 10 being normal): 10 IMPRESSION: 1. Normal head CT.  No acute intracranial abnormality. 2. ASPECTS is 10. These results were communicated to Dr. Vanessa at 9:52 pm on 04/16/2024 by text page via the [facility] messaging system. Electronically Signed   By: Morene Hoard M.D.   On: 04/16/2024 21:53     Procedures   Medications Ordered in the ED  LORazepam  (ATIVAN ) injection 1 mg (1 mg Intravenous Given 04/16/24 2233)                                    Medical Decision Making Amount and/or Complexity of Data Reviewed Labs: ordered. Radiology: ordered and independent interpretation performed. ECG/medicine tests: ordered and independent interpretation performed.  Risk Prescription drug management.   44 year old male presenting to the ED with left-sided weakness.  Began today around 6 PM.  Reports this has happened before and he reports prior stroke, however prior records contradicts this.  He is awake, alert, oriented here.  He is talking out of the right side of his mouth but with purposeful speech and answering questions appropriately.  Has left-sided weakness, however this seems purposeful and intentional.  No right-sided symptoms.  He  has passed swallow screen.  Neurology, Dr. Vanessa, has evaluated already-- feels strongly this is functional disorder.  He has had similar presentation here several times as well as other health systems.  All prior imaging has been negative.  Recommend MRI as he continues to present with weakness.  If negative, will discharge back to jail.  MRI is negative.  Patient will be discharged back to jail.  Can return here for new concerns.  Final diagnoses:  Functional neurologic complaint    ED Discharge Orders     None          Jarold Olam HERO, PA-C 04/16/24 2332  "

## 2024-04-16 NOTE — ED Notes (Signed)
 Patient transported to MRI

## 2024-04-16 NOTE — Discharge Instructions (Signed)
 MRI is negative.  There is NO STROKE. You can follow-up with your doctor. Return here for new concerns.

## 2024-04-16 NOTE — ED Triage Notes (Signed)
 Patient arrived with EMS from jail , LSN 6 pm this evening , history of CVA with left side deficits . Slurred speech with left facial droop and left side weakness .

## 2024-04-16 NOTE — Code Documentation (Signed)
 Stroke Response Nurse Documentation Code Documentation  Christophere Hillhouse. is a 44 y.o. male arriving to [facility]  via [facility] on 1/31 with past medical hx of self reported CVA 2016. On No antithrombotic. Code stroke was activated by EMS.   Patient from jail where he was LKW at 1800 and now complaining of left sided weakness.   Stroke team at the bedside on patient arrival. Labs drawn and patient cleared for CT by Dr. Cottie. Patient to CT with team. NIHSS 10, see documentation for details and code stroke times. Patient with right facial droop, left arm weakness, left leg weakness, left decreased sensation, and dysarthria  on exam. The following imaging was completed:  CT Head and MRI. Patient is not a candidate for IV Thrombolytic due to low suspicion for stroke. Patient is not a candidate for IR due to low suspicion for stroke.   Care Plan: MRI.    Bedside handoff with ED RN Beryl.    Griselda Alm LELON  Rapid Response RN
